# Patient Record
Sex: Male | Born: 1980 | Race: Black or African American | Hispanic: No | Marital: Married | Smoking: Never smoker
Health system: Southern US, Community
[De-identification: ages and names within clinical notes are randomized; demographics above are authoritative.]

## PROBLEM LIST (undated history)

## (undated) DIAGNOSIS — R718 Other abnormality of red blood cells: Secondary | ICD-10-CM

## (undated) DIAGNOSIS — I517 Cardiomegaly: Secondary | ICD-10-CM

## (undated) DIAGNOSIS — E1129 Type 2 diabetes mellitus with other diabetic kidney complication: Secondary | ICD-10-CM

## (undated) DIAGNOSIS — R9389 Abnormal findings on diagnostic imaging of other specified body structures: Secondary | ICD-10-CM

## (undated) DIAGNOSIS — D691 Qualitative platelet defects: Secondary | ICD-10-CM

## (undated) DIAGNOSIS — I10 Essential (primary) hypertension: Secondary | ICD-10-CM

## (undated) DIAGNOSIS — G4730 Sleep apnea, unspecified: Secondary | ICD-10-CM

## (undated) DIAGNOSIS — Z9289 Personal history of other medical treatment: Secondary | ICD-10-CM

## (undated) DIAGNOSIS — R7303 Prediabetes: Secondary | ICD-10-CM

## (undated) HISTORY — DX: Sleep apnea, unspecified: G47.30

## (undated) HISTORY — DX: Personal history of other medical treatment: Z92.89

---
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
## 1998-04-02 HISTORY — PX: HAND SURGERY: SHX662

## 2000-10-09 ENCOUNTER — Emergency Department (HOSPITAL_COMMUNITY): Admission: EM | Admit: 2000-10-09 | Discharge: 2000-10-09 | Payer: Self-pay | Admitting: Emergency Medicine

## 2001-06-12 ENCOUNTER — Emergency Department (HOSPITAL_COMMUNITY): Admission: EM | Admit: 2001-06-12 | Discharge: 2001-06-12 | Payer: Self-pay | Admitting: Emergency Medicine

## 2001-10-21 ENCOUNTER — Emergency Department (HOSPITAL_COMMUNITY): Admission: EM | Admit: 2001-10-21 | Discharge: 2001-10-22 | Payer: Self-pay | Admitting: Emergency Medicine

## 2002-11-27 ENCOUNTER — Emergency Department (HOSPITAL_COMMUNITY): Admission: EM | Admit: 2002-11-27 | Discharge: 2002-11-27 | Payer: Self-pay | Admitting: Emergency Medicine

## 2004-02-23 ENCOUNTER — Emergency Department (HOSPITAL_COMMUNITY): Admission: EM | Admit: 2004-02-23 | Discharge: 2004-02-23 | Payer: Self-pay | Admitting: Emergency Medicine

## 2005-03-09 ENCOUNTER — Emergency Department (HOSPITAL_COMMUNITY): Admission: EM | Admit: 2005-03-09 | Discharge: 2005-03-09 | Payer: Self-pay | Admitting: Emergency Medicine

## 2005-07-28 ENCOUNTER — Emergency Department (HOSPITAL_COMMUNITY): Admission: EM | Admit: 2005-07-28 | Discharge: 2005-07-28 | Payer: Self-pay | Admitting: Family Medicine

## 2005-10-11 ENCOUNTER — Emergency Department (HOSPITAL_COMMUNITY): Admission: EM | Admit: 2005-10-11 | Discharge: 2005-10-11 | Payer: Self-pay | Admitting: Family Medicine

## 2005-11-09 ENCOUNTER — Emergency Department (HOSPITAL_COMMUNITY): Admission: EM | Admit: 2005-11-09 | Discharge: 2005-11-09 | Payer: Self-pay | Admitting: Family Medicine

## 2005-11-16 ENCOUNTER — Emergency Department (HOSPITAL_COMMUNITY): Admission: EM | Admit: 2005-11-16 | Discharge: 2005-11-16 | Payer: Self-pay | Admitting: Family Medicine

## 2005-11-17 ENCOUNTER — Emergency Department (HOSPITAL_COMMUNITY): Admission: EM | Admit: 2005-11-17 | Discharge: 2005-11-17 | Payer: Self-pay | Admitting: Emergency Medicine

## 2005-11-18 ENCOUNTER — Emergency Department (HOSPITAL_COMMUNITY): Admission: EM | Admit: 2005-11-18 | Discharge: 2005-11-18 | Payer: Self-pay | Admitting: Family Medicine

## 2005-11-19 ENCOUNTER — Emergency Department (HOSPITAL_COMMUNITY): Admission: EM | Admit: 2005-11-19 | Discharge: 2005-11-19 | Payer: Self-pay | Admitting: Emergency Medicine

## 2005-11-25 ENCOUNTER — Emergency Department (HOSPITAL_COMMUNITY): Admission: EM | Admit: 2005-11-25 | Discharge: 2005-11-25 | Payer: Self-pay | Admitting: Family Medicine

## 2006-01-28 ENCOUNTER — Emergency Department (HOSPITAL_COMMUNITY): Admission: EM | Admit: 2006-01-28 | Discharge: 2006-01-28 | Payer: Self-pay | Admitting: Family Medicine

## 2006-03-18 ENCOUNTER — Emergency Department (HOSPITAL_COMMUNITY): Admission: EM | Admit: 2006-03-18 | Discharge: 2006-03-18 | Payer: Self-pay | Admitting: Family Medicine

## 2006-06-21 ENCOUNTER — Emergency Department (HOSPITAL_COMMUNITY): Admission: EM | Admit: 2006-06-21 | Discharge: 2006-06-21 | Payer: Self-pay | Admitting: Family Medicine

## 2006-08-08 ENCOUNTER — Emergency Department (HOSPITAL_COMMUNITY): Admission: EM | Admit: 2006-08-08 | Discharge: 2006-08-08 | Payer: Self-pay | Admitting: Family Medicine

## 2006-12-12 ENCOUNTER — Emergency Department (HOSPITAL_COMMUNITY): Admission: EM | Admit: 2006-12-12 | Discharge: 2006-12-12 | Payer: Self-pay | Admitting: Emergency Medicine

## 2007-01-02 ENCOUNTER — Emergency Department (HOSPITAL_COMMUNITY): Admission: EM | Admit: 2007-01-02 | Discharge: 2007-01-02 | Payer: Self-pay | Admitting: Emergency Medicine

## 2007-05-30 ENCOUNTER — Emergency Department (HOSPITAL_COMMUNITY): Admission: EM | Admit: 2007-05-30 | Discharge: 2007-05-30 | Payer: Self-pay | Admitting: Emergency Medicine

## 2007-12-08 ENCOUNTER — Emergency Department (HOSPITAL_COMMUNITY): Admission: EM | Admit: 2007-12-08 | Discharge: 2007-12-08 | Payer: Self-pay | Admitting: Family Medicine

## 2008-10-02 ENCOUNTER — Ambulatory Visit (HOSPITAL_COMMUNITY): Admission: EM | Admit: 2008-10-02 | Discharge: 2008-10-02 | Payer: Self-pay | Admitting: Emergency Medicine

## 2008-11-10 ENCOUNTER — Emergency Department (HOSPITAL_COMMUNITY): Admission: EM | Admit: 2008-11-10 | Discharge: 2008-11-10 | Payer: Self-pay | Admitting: Family Medicine

## 2009-01-16 ENCOUNTER — Observation Stay (HOSPITAL_COMMUNITY): Admission: EM | Admit: 2009-01-16 | Discharge: 2009-01-17 | Payer: Self-pay | Admitting: Emergency Medicine

## 2009-01-16 ENCOUNTER — Emergency Department (HOSPITAL_COMMUNITY): Admission: EM | Admit: 2009-01-16 | Discharge: 2009-01-17 | Payer: Self-pay | Admitting: Emergency Medicine

## 2010-05-07 ENCOUNTER — Inpatient Hospital Stay (INDEPENDENT_AMBULATORY_CARE_PROVIDER_SITE_OTHER)
Admission: RE | Admit: 2010-05-07 | Discharge: 2010-05-07 | Disposition: A | Discharge status: Home / Self Care | Payer: Self-pay | Source: Ambulatory Visit | Attending: Family Medicine | Admitting: Family Medicine

## 2010-05-07 DIAGNOSIS — K649 Unspecified hemorrhoids: Secondary | ICD-10-CM

## 2010-07-06 LAB — BASIC METABOLIC PANEL
BUN: 10 mg/dL (ref 6–23)
CO2: 27 mEq/L (ref 19–32)
Calcium: 8.1 mg/dL — ABNORMAL LOW (ref 8.4–10.5)
Chloride: 106 mEq/L (ref 96–112)
Creatinine, Ser: 1.01 mg/dL (ref 0.4–1.5)
GFR calc Af Amer: >60 mL/min (ref >60)
GFR calc non Af Amer: >60 mL/min (ref >60)
Glucose, Bld: 95 mg/dL (ref 70–99)
Potassium: 4.1 mEq/L (ref 3.5–5.1)
Sodium: 137 mEq/L (ref 135–145)

## 2010-07-06 LAB — POCT CARDIAC MARKERS
CKMB, poc: 2.1 ng/mL (ref 1.0–8.0)
CKMB, poc: <1 ng/mL — ABNORMAL LOW (ref 1.0–8.0)
Myoglobin, poc: 63.3 ng/mL (ref 12–200)
Myoglobin, poc: 74.6 ng/mL (ref 12–200)
Troponin i, poc: <0.05 ng/mL (ref 0.00–0.09)
Troponin i, poc: <0.05 ng/mL (ref 0.00–0.09)

## 2010-07-06 LAB — DIFFERENTIAL
Basophils Absolute: 0 10*3/uL (ref 0.0–0.1)
Basophils Relative: 0 % (ref 0–1)
Eosinophils Absolute: 0.1 10*3/uL (ref 0.0–0.7)
Eosinophils Relative: 2 % (ref 0–5)
Lymphocytes Relative: 48 % — ABNORMAL HIGH (ref 12–46)
Lymphs Abs: 2.2 10*3/uL (ref 0.7–4.0)
Monocytes Absolute: 0.5 10*3/uL (ref 0.1–1.0)
Monocytes Relative: 11 % (ref 3–12)
Neutro Abs: 1.8 10*3/uL (ref 1.7–7.7)
Neutrophils Relative %: 39 % — ABNORMAL LOW (ref 43–77)

## 2010-07-06 LAB — CBC
HCT: 36.5 % — ABNORMAL LOW (ref 39.0–52.0)
Hemoglobin: 11.8 g/dL — ABNORMAL LOW (ref 13.0–17.0)
MCHC: 32.4 g/dL (ref 30.0–36.0)
MCV: 67.5 fL — ABNORMAL LOW (ref 78.0–100.0)
Platelets: 231 10*3/uL (ref 150–400)
RBC: 5.4 MIL/uL (ref 4.22–5.81)
RDW: 16.6 % — ABNORMAL HIGH (ref 11.5–15.5)
WBC: 4.7 10*3/uL (ref 4.0–10.5)

## 2010-07-06 LAB — URINALYSIS, ROUTINE W REFLEX MICROSCOPIC
Bilirubin Urine: NEGATIVE
Glucose, UA: NEGATIVE mg/dL
Hgb urine dipstick: NEGATIVE
Ketones, ur: NEGATIVE mg/dL
Nitrite: NEGATIVE
Protein, ur: NEGATIVE mg/dL
Specific Gravity, Urine: 1.03 (ref 1.005–1.030)
Urobilinogen, UA: 1 mg/dL (ref 0.0–1.0)
pH: 6 (ref 5.0–8.0)

## 2010-07-06 LAB — CK TOTAL AND CKMB (NOT AT ARMC)
CK, MB: 2.7 ng/mL (ref 0.3–4.0)
Relative Index: 0.8 (ref 0.0–2.5)
Total CK: 353 U/L — ABNORMAL HIGH (ref 7–232)

## 2010-07-06 LAB — RAPID URINE DRUG SCREEN, HOSP PERFORMED
Amphetamines: NOT DETECTED
Barbiturates: NOT DETECTED
Benzodiazepines: NOT DETECTED
Cocaine: NOT DETECTED
Opiates: NOT DETECTED
Tetrahydrocannabinol: NOT DETECTED

## 2010-07-06 LAB — TROPONIN I: Troponin I: 0.01 ng/mL (ref 0.00–0.06)

## 2010-07-09 LAB — DIFFERENTIAL
Basophils Absolute: 0 10*3/uL (ref 0.0–0.1)
Basophils Relative: 1 % (ref 0–1)
Eosinophils Absolute: 0.1 10*3/uL (ref 0.0–0.7)
Eosinophils Relative: 2 % (ref 0–5)
Lymphocytes Relative: 37 % (ref 12–46)
Lymphs Abs: 1.4 10*3/uL (ref 0.7–4.0)
Monocytes Absolute: 0.7 10*3/uL (ref 0.1–1.0)
Monocytes Relative: 18 % — ABNORMAL HIGH (ref 3–12)
Neutro Abs: 1.6 10*3/uL — ABNORMAL LOW (ref 1.7–7.7)
Neutrophils Relative %: 43 % (ref 43–77)

## 2010-07-09 LAB — CBC
HCT: 40.7 % (ref 39.0–52.0)
Hemoglobin: 13.2 g/dL (ref 13.0–17.0)
MCHC: 32.5 g/dL (ref 30.0–36.0)
MCV: 67.3 fL — ABNORMAL LOW (ref 78.0–100.0)
Platelets: 230 10*3/uL (ref 150–400)
RBC: 6.05 MIL/uL — ABNORMAL HIGH (ref 4.22–5.81)
RDW: 16.3 % — ABNORMAL HIGH (ref 11.5–15.5)
WBC: 3.8 10*3/uL — ABNORMAL LOW (ref 4.0–10.5)

## 2010-07-09 LAB — BASIC METABOLIC PANEL
BUN: 8 mg/dL (ref 6–23)
CO2: 24 mEq/L (ref 19–32)
Calcium: 8.8 mg/dL (ref 8.4–10.5)
Chloride: 108 mEq/L (ref 96–112)
Creatinine, Ser: 0.93 mg/dL (ref 0.4–1.5)
GFR calc Af Amer: >60 mL/min (ref >60)
GFR calc non Af Amer: >60 mL/min (ref >60)
Glucose, Bld: 129 mg/dL — ABNORMAL HIGH (ref 70–99)
Potassium: 3.9 mEq/L (ref 3.5–5.1)
Sodium: 139 mEq/L (ref 135–145)

## 2010-08-15 NOTE — Op Note (Signed)
NAME:  Darren Little, Darren Little NO.:  [id number]   MEDICAL RECORD NO.:  [id number]          PATIENT TYPE:  INP   LOCATION:  1824                         FACILITY:  [facility]   PHYSICIAN:  Johnette Abraham, MD    DATE OF BIRTH:  Jan 31, 1981   DATE OF PROCEDURE:  10/02/2008  DATE OF DISCHARGE:  10/02/2008                               OPERATIVE REPORT   SURGEON:  Harrill C. Izora Ribas, MD   PREOPERATIVE DIAGNOSIS:  Complex laceration to the right hand.   POSTOPERATIVE DIAGNOSIS:  Complex laceration to the right hand.   PROCEDURES:  1. Exploration of a complex wound of the right hand.  2. Exploration of the flexor tendon sheath of both the right long and      right ring fingers.  3. Exploration of the neurovascular bundles of the right ring and      right long finger.  4. Debridement of skin, subcutaneous tissue, fascia, and muscle.  5. Release of the A1 pulley of the right ring finger and right long      finger.  6. Open reduction of the proximal phalanx of the right long finger,  7. layered wound closure totaling 6 cm.   INDICATIONS:  Darren Little is a 30 year old male who had a hydraulic  lift fall on his hand this afternoon.  He presented to the emergency  department and I was consulted.  Risks, benefits, and alternatives of  surgery were discussed with the patient and the patient's significant  other, and they  agreed to proceed.  Consent was obtained.   PROCEDURE:  The patient was taken to the operating room, placed supine  on the operating room table.  Preoperative antibiotics were given.  Lower extremities were padded.  General anesthesia was administered  without difficulty.  The right upper extremity was prepped and draped in  normal sterile fashion.  A tourniquet was used.  The arm was  exsanguinated with an Esmarch, and the tourniquet was inflated to 250  mmHg.  The wound was evaluated, it was a C-shaped laceration in the palm  overlying the distal palmar crease back  to approximately the distal end  of the transverse carpal ligament overlying the third and fourth  metacarpals.  The wound was explored.  Nonviable skin, subcutaneous  tissue, fascia, and muscle were debrided.  The wound was all the way  down to the flexor tendon sheath.  Both the flexor tendon sheath to the  ring and long finger were opened and explored.  Both tendons were in  continuity.  Following, the neurovascular bundle was isolated and  traced.  The main branch was traced in its division to the ulnar long  and radial ring fingers were clearly identified and without significant  laceration.  The neurovascular bundle to the radial long finger was also  visualized and was without significant laceration.  Because of the  degree of swelling and muscle contusion, the A1 pulley to the ring and  long finger were both incised.  Afterwards the tourniquet was released.  Hemostasis was obtained.  Wound was irrigated thoroughly with irrigation  solution, and  then the wound was closed in layers.  The initial layer  was the subcutaneous and deep dermal layers with interrupted 5-0 Vicryls  followed by skin with several interrupted 5-0 nylon sutures.  All  fingers were nice and pink at the  completion of the case.  Simple in-line traction was used for reduction  of the proximal phalanx of the right long finger.  Afterwards, a sterile  dressing and splint were placed.  The patient tolerated the procedure  well, was taken to the recovery room in stable condition.      Johnette Abraham, MD  Electronically Signed     HCC/MEDQ  D:  10/02/2008  T:  10/03/2008  Job:  [id number]

## 2011-01-02 ENCOUNTER — Inpatient Hospital Stay (INDEPENDENT_AMBULATORY_CARE_PROVIDER_SITE_OTHER)
Admission: RE | Admit: 2011-01-02 | Discharge: 2011-01-02 | Disposition: A | Discharge status: Home / Self Care | Payer: Self-pay | Source: Ambulatory Visit | Attending: Emergency Medicine | Admitting: Emergency Medicine

## 2011-01-02 DIAGNOSIS — L259 Unspecified contact dermatitis, unspecified cause: Secondary | ICD-10-CM

## 2011-04-08 ENCOUNTER — Emergency Department (HOSPITAL_COMMUNITY)
Admission: EM | Admit: 2011-04-08 | Discharge: 2011-04-08 | Disposition: A | Discharge status: Home / Self Care | Payer: Self-pay | Attending: Emergency Medicine | Admitting: Emergency Medicine

## 2011-04-08 ENCOUNTER — Encounter: Payer: Self-pay | Admitting: *Deleted

## 2011-04-08 DIAGNOSIS — Z79899 Other long term (current) drug therapy: Secondary | ICD-10-CM | POA: Insufficient documentation

## 2011-04-08 DIAGNOSIS — K089 Disorder of teeth and supporting structures, unspecified: Secondary | ICD-10-CM | POA: Insufficient documentation

## 2011-04-08 DIAGNOSIS — I10 Essential (primary) hypertension: Secondary | ICD-10-CM | POA: Insufficient documentation

## 2011-04-08 DIAGNOSIS — K0889 Other specified disorders of teeth and supporting structures: Secondary | ICD-10-CM

## 2011-04-08 DIAGNOSIS — K029 Dental caries, unspecified: Secondary | ICD-10-CM | POA: Insufficient documentation

## 2011-04-08 HISTORY — DX: Essential (primary) hypertension: I10

## 2011-04-08 MED ORDER — CLINDAMYCIN HCL 150 MG PO CAPS: 300.0000 mg | ORAL_CAPSULE | Freq: Three times a day (TID) | ORAL | Status: AC

## 2011-04-08 MED ORDER — HYDROMORPHONE HCL PF 1 MG/ML IJ SOLN
1.0000 mg | Freq: Once | INTRAMUSCULAR | Status: AC
  Administered 2011-04-08: 1 mg via INTRAMUSCULAR
  Filled 2011-04-08: qty 1

## 2011-04-08 MED ORDER — KETOROLAC TROMETHAMINE 30 MG/ML IJ SOLN
30.0000 mg | Freq: Once | INTRAMUSCULAR | Status: AC
  Administered 2011-04-08: 30 mg via INTRAMUSCULAR
  Filled 2011-04-08: qty 1

## 2011-04-08 MED ORDER — OXYCODONE-ACETAMINOPHEN 5-325 MG PO TABS: 1.0000 | ORAL_TABLET | Freq: Four times a day (QID) | ORAL | Status: DC | PRN

## 2011-04-08 NOTE — ED Provider Notes (Signed)
History     CSN: [id number]  Arrival date & time 04/08/11  0003   First MD Initiated Contact with Patient 04/08/11 0038      Chief Complaint  Patient presents with  . Dental Pain    (Consider location/radiation/quality/duration/timing/severity/associated sxs/prior treatment) Patient is a 31 y.o. male presenting with tooth pain. The history is provided by the patient.  Dental PainPrimary symptoms do not include sore throat or cough.  Additional symptoms do not include: trouble swallowing, drooling and ear pain.   patient's had dental pain for last couple months. Increased pain tonight. He has bad teeth and no insurance to get it fixed. No swelling. No trouble breathing. No chest pain. His blood pressure is high. He is on blood pressure medicine. No trouble swallowing.  Past Medical History  Diagnosis Date  . Hypertension     History reviewed. No pertinent past surgical history.  History reviewed. No pertinent family history.  History  Substance Use Topics  . Smoking status: Never Smoker   . Smokeless tobacco: Not on file  . Alcohol Use: No      Review of Systems  HENT: Positive for dental problem. Negative for ear pain, sore throat, drooling, mouth sores and trouble swallowing.   Respiratory: Negative for cough and chest tightness.   Cardiovascular: Negative for chest pain.  Genitourinary: Negative for flank pain.  Skin: Negative for rash.  Neurological: Negative for seizures and numbness.    Allergies  Review of patient's allergies indicates no known allergies.  Home Medications   Current Outpatient Rx  Name Route Sig Dispense Refill  . IBUPROFEN 100 MG/5ML PO SUSP Oral Take 100 mg by mouth once.      Marland Kitchen LISINOPRIL-HYDROCHLOROTHIAZIDE 20-25 MG PO TABS Oral Take 1 tablet by mouth daily.      Marland Kitchen PENICILLIN V POTASSIUM 250 MG PO TABS Oral Take 250 mg by mouth once.      Marland Kitchen CLINDAMYCIN HCL 150 MG PO CAPS Oral Take 2 capsules (300 mg total) by mouth 3 (three) times  daily. 28 capsule 0  . OXYCODONE-ACETAMINOPHEN 5-325 MG PO TABS Oral Take 1-2 tablets by mouth every 6 (six) hours as needed for pain. 20 tablet 0    BP 179/117  Pulse 80  Temp(Src) 98.1 F (36.7 C) (Oral)  Resp 20  SpO2 96%  Physical Exam  Nursing note and vitals reviewed. Constitutional: He is oriented to person, place, and time. He appears well-developed and well-nourished.  HENT:  Head: Normocephalic and atraumatic.       Left upper 6 and seventh tooth from midline tender with cavities. Left lower fifth 2 previously removed. Sixth and seventh tooth tender. No swelling of jaw. No fluctuance.  Eyes: EOM are normal. Pupils are equal, round, and reactive to light.  Neck: Normal range of motion. Neck supple.  Cardiovascular: Normal rate, regular rhythm and normal heart sounds.   No murmur heard.      Hypertension  Pulmonary/Chest: Effort normal and breath sounds normal.  Abdominal: Soft. Bowel sounds are normal. He exhibits no distension and no mass. There is no tenderness. There is no rebound and no guarding.  Musculoskeletal: Normal range of motion. He exhibits no edema.  Neurological: He is alert and oriented to person, place, and time. No cranial nerve deficit.  Skin: Skin is warm and dry.  Psychiatric: He has a normal mood and affect.    ED Course  Procedures (including critical care time)  Labs Reviewed - No data to display No  results found.   1. Pain, dental   2. Hypertension       MDM  Dental pain with bad teeth. Pain improved with treatment. Patient has a dentist to follow with. He's been on penicillin, will change to Cleocin. Hypertension is noted the patient. He is on medication. This improved somewhat with pain medication. He'll follow with his Dr.        Harrold Donath R. Rubin Payor, MD 04/08/11 [phone]

## 2011-04-08 NOTE — ED Notes (Signed)
Notified Dr. Rubin Payor of this pt blood pressure. He states that he is aware of this bp.

## 2011-04-08 NOTE — ED Notes (Signed)
Pt reports having upper and lower (L) sided tooth pain.  Reports intermittent pain x 2-3 months with increased pain tonight.  Pt had teeth pulled by dentist, pain in that socket, other tooth is chipped-no insurance so is unable to get it fixed.  No swelling, respiratory difficulty.

## 2011-04-09 ENCOUNTER — Emergency Department (HOSPITAL_COMMUNITY)
Admission: EM | Admit: 2011-04-09 | Discharge: 2011-04-10 | Disposition: A | Discharge status: Home / Self Care | Payer: Self-pay | Attending: Emergency Medicine | Admitting: Emergency Medicine

## 2011-04-09 ENCOUNTER — Encounter (HOSPITAL_COMMUNITY): Payer: Self-pay | Admitting: *Deleted

## 2011-04-09 DIAGNOSIS — Z79899 Other long term (current) drug therapy: Secondary | ICD-10-CM | POA: Insufficient documentation

## 2011-04-09 DIAGNOSIS — K0889 Other specified disorders of teeth and supporting structures: Secondary | ICD-10-CM

## 2011-04-09 DIAGNOSIS — I10 Essential (primary) hypertension: Secondary | ICD-10-CM | POA: Insufficient documentation

## 2011-04-09 DIAGNOSIS — K089 Disorder of teeth and supporting structures, unspecified: Secondary | ICD-10-CM | POA: Insufficient documentation

## 2011-04-09 DIAGNOSIS — K029 Dental caries, unspecified: Secondary | ICD-10-CM | POA: Insufficient documentation

## 2011-04-09 NOTE — ED Notes (Signed)
Pt states that his bottom left tooth has been hurting him all day. Tooth is intact, pt has a rx for clindamycin from a visit on Friday that he just got filled today. Pt unable to close mouth due to pain. Pt alert and oriented, denies facial swelling, or facial pain.

## 2011-04-09 NOTE — ED Notes (Signed)
C/o bottom L back tooth pain, ongoing all day, "worse after taking clindamycin". Seen here Friday night. Radiates into L ear. Pt moaning, writhing and restless.

## 2011-04-10 ENCOUNTER — Emergency Department (HOSPITAL_COMMUNITY)
Admission: EM | Admit: 2011-04-10 | Discharge: 2011-04-10 | Discharge status: Against Medical Advice | Payer: Self-pay | Attending: Emergency Medicine | Admitting: Emergency Medicine

## 2011-04-10 ENCOUNTER — Encounter (HOSPITAL_COMMUNITY): Payer: Self-pay | Admitting: Emergency Medicine

## 2011-04-10 DIAGNOSIS — H9209 Otalgia, unspecified ear: Secondary | ICD-10-CM | POA: Insufficient documentation

## 2011-04-10 DIAGNOSIS — K089 Disorder of teeth and supporting structures, unspecified: Secondary | ICD-10-CM | POA: Insufficient documentation

## 2011-04-10 NOTE — ED Notes (Signed)
Pt here for tooth pain on left side of face and can not hear out of left ear; pt sts pain started on last thursday

## 2011-04-10 NOTE — ED Notes (Signed)
No answer to call at triage and waiting areas pt possible eloped

## 2011-04-10 NOTE — ED Provider Notes (Signed)
History     CSN: [id number]  Arrival date & time 04/09/11  2320   First MD Initiated Contact with Patient 04/10/11 0015      Chief Complaint  Patient presents with  . Dental Pain     HPI  History provided by the patient. Patient is a 31 year old male who presents with complaints of left lower molar pain has become worse earlier today around 10 AM. She reports having intermittent problems with the same tooth for the past several days. He reports being seen in the emergency room 3 days ago on Friday. He was given prescriptions for pain medicine and antibiotic which she reports just starting today. Patient also reports having some bleeding from the area. Pain is sometimes improved with very cold water but then pain returns for a quickly. She denies any swelling of the tongue. He denies any fever, chills, sweats patient has not tried followup with a dentist.    Past Medical History  Diagnosis Date  . Hypertension     History reviewed. No pertinent past surgical history.  History reviewed. No pertinent family history.  History  Substance Use Topics  . Smoking status: Never Smoker   . Smokeless tobacco: Not on file  . Alcohol Use: No      Review of Systems  Constitutional: Negative for fever and chills.  All other systems reviewed and are negative.    Allergies  Review of patient's allergies indicates no known allergies.  Home Medications   Current Outpatient Rx  Name Route Sig Dispense Refill  . CLINDAMYCIN HCL 150 MG PO CAPS Oral Take 2 capsules (300 mg total) by mouth 3 (three) times daily. 28 capsule 0  . IBUPROFEN 100 MG/5ML PO SUSP Oral Take 100 mg by mouth once.      Marland Kitchen LISINOPRIL-HYDROCHLOROTHIAZIDE 20-25 MG PO TABS Oral Take 1 tablet by mouth daily.      . OXYCODONE-ACETAMINOPHEN 5-325 MG PO TABS Oral Take 1-2 tablets by mouth every 6 (six) hours as needed for pain. 20 tablet 0  . PENICILLIN V POTASSIUM 250 MG PO TABS Oral Take 250 mg by mouth once.         BP 209/129  Pulse 77  Temp(Src) 97.7 F (36.5 C) (Oral)  Resp 21  SpO2 95%  Physical Exam  Nursing note and vitals reviewed. Constitutional: He is oriented to person, place, and time. He appears well-developed and well-nourished.  HENT:  Head: Normocephalic.  Mouth/Throat: Oropharynx is clear and moist.       Patient with missing left first molar. There is a carry to the base of the anterior left lower second molar. There is significant pain with percussion over left lower second molar. There is a small amount of bleeding from the gingiva. There is no swelling or drainable abscess identified. Patient also has very mild pain to the left upper second molar area. No significant carry identified. No signs of Ludwig's angina  Neck: Normal range of motion. Neck supple.  Cardiovascular: Normal rate and regular rhythm.   Pulmonary/Chest: Effort normal and breath sounds normal.  Abdominal: Soft.  Lymphadenopathy:    He has no cervical adenopathy.  Neurological: He is alert and oriented to person, place, and time.  Skin: Skin is warm and dry.  Psychiatric: He has a normal mood and affect. His behavior is normal.    ED Course  Procedures   Dental Block Performed by: Angus Seller Authorized by: Angus Seller Consent: Verbal consent obtained. Risks and benefits: risks, benefits  and alternatives were discussed Consent given by: patient Patient identity confirmed: provided demographic data  Location: Left lower second molar  Local anesthetic: Bupivacaine 0.5% with epinephrine  Anesthetic total: 3.6 ml  Irrigation method: syringe  Patient tolerance: Patient tolerated the procedure well with no immediate complications. Pain improved.     1. Pain, dental   2. Dental caries      MDM  12:30 AM patient seen and evaluated. Patient in no acute distress.        Angus Seller, [place] 04/10/11 [phone]

## 2011-04-10 NOTE — ED Notes (Signed)
Call to triage and waiting areas no answer at this time x2 calls pt assumed to have eloped

## 2011-04-11 ENCOUNTER — Encounter (HOSPITAL_COMMUNITY): Payer: Self-pay | Admitting: Emergency Medicine

## 2011-04-11 ENCOUNTER — Emergency Department (HOSPITAL_COMMUNITY)
Admission: EM | Admit: 2011-04-11 | Discharge: 2011-04-11 | Disposition: A | Discharge status: Home / Self Care | Payer: Self-pay | Attending: Emergency Medicine | Admitting: Emergency Medicine

## 2011-04-11 DIAGNOSIS — K029 Dental caries, unspecified: Secondary | ICD-10-CM | POA: Insufficient documentation

## 2011-04-11 DIAGNOSIS — Z79899 Other long term (current) drug therapy: Secondary | ICD-10-CM | POA: Insufficient documentation

## 2011-04-11 DIAGNOSIS — K089 Disorder of teeth and supporting structures, unspecified: Secondary | ICD-10-CM | POA: Insufficient documentation

## 2011-04-11 DIAGNOSIS — I10 Essential (primary) hypertension: Secondary | ICD-10-CM | POA: Insufficient documentation

## 2011-04-11 MED ORDER — OXYCODONE-ACETAMINOPHEN 5-325 MG PO TABS
2.0000 | ORAL_TABLET | Freq: Once | ORAL | Status: AC
  Administered 2011-04-11: 2 via ORAL
  Filled 2011-04-11: qty 2

## 2011-04-11 NOTE — ED Provider Notes (Signed)
Medical screening examination/treatment/procedure(s) were performed by non-physician practitioner and as supervising physician I was immediately available for consultation/collaboration.   Vida Roller, MD 04/11/11 2222

## 2011-04-11 NOTE — ED Notes (Signed)
Spoke to [place] office and they will call pt and set up time to have tooth pulled. Dr office given pt best phone number to reach him. Pt states he understands that office will call him

## 2011-04-11 NOTE — ED Provider Notes (Signed)
History     CSN: [id number]  Arrival date & time 04/11/11  1035   First MD Initiated Contact with Patient 04/11/11 1152      Chief Complaint  Patient presents with  . Dental Pain    (Consider location/radiation/quality/duration/timing/severity/associated sxs/prior treatment) HPI Patient presents emergency room with complaints of dental pain. Patient states the pain has been ongoing since last week. He has been seen daily in emergency department for this tooth pain over the last 4 days. Patient has been referred to a dentist as well as an Transport planner. Patient was given pain medications as well as antibiotics but none of these things are working. Patient states the pain is upper left posterior molar region. He saw a dentist he tried to hold the tooth but was unable to do so. He was then referred to an oral surgeon he cannot afford the visit so he was told to come back to the emergency room because [facility] has a referral system. Past Medical History  Diagnosis Date  . Hypertension     History reviewed. No pertinent past surgical history.  No family history on file.  History  Substance Use Topics  . Smoking status: Never Smoker   . Smokeless tobacco: Not on file  . Alcohol Use: No      Review of Systems  All other systems reviewed and are negative.    Allergies  Review of patient's allergies indicates no known allergies.  Home Medications   Current Outpatient Rx  Name Route Sig Dispense Refill  . CLINDAMYCIN HCL 150 MG PO CAPS Oral Take 2 capsules (300 mg total) by mouth 3 (three) times daily. 28 capsule 0  . LISINOPRIL-HYDROCHLOROTHIAZIDE 20-25 MG PO TABS Oral Take 1 tablet by mouth daily.      . OXYCODONE-ACETAMINOPHEN 5-325 MG PO TABS Oral Take 1-2 tablets by mouth every 6 (six) hours as needed. For pain.     Marland Kitchen PENICILLIN V POTASSIUM 250 MG PO TABS Oral Take 250 mg by mouth once.       BP 177/122  Pulse 87  Temp(Src) 97 F (36.1 C) (Oral)  Resp 16  SpO2  99%  Physical Exam  Nursing note and vitals reviewed. Constitutional: He appears well-developed and well-nourished. No distress.  HENT:  Head: Normocephalic and atraumatic. No trismus in the jaw.  Right Ear: External ear normal.  Left Ear: External ear normal.  Mouth/Throat: No oral lesions. Dental caries present. No dental abscesses, uvula swelling or lacerations. No oropharyngeal exudate.         Dental caries noted,  Eyes: Conjunctivae are normal. Right eye exhibits no discharge. Left eye exhibits no discharge. No scleral icterus.  Neck: Neck supple. No tracheal deviation present.  Cardiovascular: Normal rate.   Pulmonary/Chest: Effort normal. No stridor. No respiratory distress.  Musculoskeletal: He exhibits no edema.  Neurological: He is alert. Cranial nerve deficit: no gross deficits.  Skin: Skin is warm and dry. No rash noted.  Psychiatric: He has a normal mood and affect.    ED Course  Procedures (including critical care time)  Labs Reviewed - No data to display No results found.   Diagnosis: Dental caries   MDM   Patient with persistent dental pain due to dental caries. There is no obvious swelling noted. Patient is already on antibiotics and pain medications. I will contact the oral surgeon on call, Dr. Estella Husk  so we can assist this gentleman was getting oral surgery followup for his dental extraction.  Celene Kras, MD 04/11/11 [phone]

## 2011-04-11 NOTE — ED Notes (Signed)
States has been here numerous times for same tooth was given pain meds  States saw stacy green [facility] but it was still tender and  It did not get pulled states is still taking antiobiotics and pain pills

## 2012-08-31 DIAGNOSIS — R9389 Abnormal findings on diagnostic imaging of other specified body structures: Secondary | ICD-10-CM

## 2012-08-31 HISTORY — DX: Abnormal findings on diagnostic imaging of other specified body structures: R93.89

## 2012-09-25 ENCOUNTER — Emergency Department (HOSPITAL_COMMUNITY): Payer: Medicaid Other

## 2012-09-25 ENCOUNTER — Encounter (HOSPITAL_COMMUNITY): Payer: Self-pay | Admitting: Adult Health

## 2012-09-25 ENCOUNTER — Inpatient Hospital Stay (HOSPITAL_COMMUNITY)
Admission: EM | Admit: 2012-09-25 | Discharge: 2012-09-28 | DRG: 305 | Disposition: A | Discharge status: Home / Self Care | Payer: Medicaid Other | Attending: Cardiology | Admitting: Cardiology

## 2012-09-25 ENCOUNTER — Other Ambulatory Visit: Payer: Self-pay

## 2012-09-25 DIAGNOSIS — R718 Other abnormality of red blood cells: Secondary | ICD-10-CM

## 2012-09-25 DIAGNOSIS — R9389 Abnormal findings on diagnostic imaging of other specified body structures: Secondary | ICD-10-CM

## 2012-09-25 DIAGNOSIS — R7303 Prediabetes: Secondary | ICD-10-CM

## 2012-09-25 DIAGNOSIS — R7309 Other abnormal glucose: Secondary | ICD-10-CM | POA: Diagnosis present

## 2012-09-25 DIAGNOSIS — E66812 Obesity, class 2: Secondary | ICD-10-CM

## 2012-09-25 DIAGNOSIS — M79662 Pain in left lower leg: Secondary | ICD-10-CM

## 2012-09-25 DIAGNOSIS — R079 Chest pain, unspecified: Secondary | ICD-10-CM

## 2012-09-25 DIAGNOSIS — I10 Essential (primary) hypertension: Secondary | ICD-10-CM

## 2012-09-25 DIAGNOSIS — E669 Obesity, unspecified: Secondary | ICD-10-CM | POA: Diagnosis present

## 2012-09-25 DIAGNOSIS — D691 Qualitative platelet defects: Secondary | ICD-10-CM

## 2012-09-25 DIAGNOSIS — I517 Cardiomegaly: Secondary | ICD-10-CM

## 2012-09-25 DIAGNOSIS — M79609 Pain in unspecified limb: Secondary | ICD-10-CM | POA: Diagnosis present

## 2012-09-25 DIAGNOSIS — D649 Anemia, unspecified: Secondary | ICD-10-CM | POA: Diagnosis present

## 2012-09-25 DIAGNOSIS — I161 Hypertensive emergency: Secondary | ICD-10-CM

## 2012-09-25 DIAGNOSIS — D509 Iron deficiency anemia, unspecified: Secondary | ICD-10-CM

## 2012-09-25 DIAGNOSIS — H538 Other visual disturbances: Secondary | ICD-10-CM | POA: Diagnosis present

## 2012-09-25 HISTORY — DX: Qualitative platelet defects: D69.1

## 2012-09-25 HISTORY — DX: Prediabetes: R73.03

## 2012-09-25 HISTORY — DX: Other abnormality of red blood cells: R71.8

## 2012-09-25 HISTORY — DX: Cardiomegaly: I51.7

## 2012-09-25 HISTORY — DX: Abnormal findings on diagnostic imaging of other specified body structures: R93.89

## 2012-09-25 LAB — CBC WITH DIFFERENTIAL/PLATELET
Basophils Absolute: 0 10*3/uL (ref 0.0–0.1)
Eosinophils Absolute: 0.2 10*3/uL (ref 0.0–0.7)
Lymphs Abs: 2.3 10*3/uL (ref 0.7–4.0)
MCH: 22 pg — ABNORMAL LOW (ref 26.0–34.0)
MCHC: 33.9 g/dL (ref 30.0–36.0)
MCV: 64.9 fL — ABNORMAL LOW (ref 78.0–100.0)
Monocytes Absolute: 0.9 10*3/uL (ref 0.1–1.0)
Neutro Abs: 2.4 10*3/uL (ref 1.7–7.7)
Platelets: 219 10*3/uL (ref 150–400)
RDW: 16 % — ABNORMAL HIGH (ref 11.5–15.5)
WBC: 5.8 10*3/uL (ref 4.0–10.5)

## 2012-09-25 LAB — COMPREHENSIVE METABOLIC PANEL
AST: 27 U/L (ref 0–37)
Albumin: 3.9 g/dL (ref 3.5–5.2)
BUN: 15 mg/dL (ref 6–23)
Calcium: 8.6 mg/dL (ref 8.4–10.5)
Creatinine, Ser: 1.03 mg/dL (ref 0.50–1.35)

## 2012-09-25 LAB — POCT I-STAT TROPONIN I: Troponin i, poc: 0.04 ng/mL (ref 0.00–0.08)

## 2012-09-25 MED ORDER — ASPIRIN 81 MG PO CHEW
324.0000 mg | CHEWABLE_TABLET | Freq: Once | ORAL | Status: AC
  Administered 2012-09-25: 324 mg via ORAL
  Filled 2012-09-25: qty 4

## 2012-09-25 MED ORDER — MORPHINE SULFATE 2 MG/ML IJ SOLN
2.0000 mg | Freq: Once | INTRAMUSCULAR | Status: AC
  Administered 2012-09-25: 2 mg via INTRAVENOUS
  Filled 2012-09-25: qty 1

## 2012-09-25 MED ORDER — IOHEXOL 350 MG/ML SOLN
100.0000 mL | Freq: Once | INTRAVENOUS | Status: AC | PRN
  Administered 2012-09-25: 100 mL via INTRAVENOUS

## 2012-09-25 MED ORDER — MORPHINE SULFATE 4 MG/ML IJ SOLN
4.0000 mg | Freq: Once | INTRAMUSCULAR | Status: AC
Start: 2012-09-25 — End: 2012-09-25
  Administered 2012-09-25: 4 mg via INTRAVENOUS
  Filled 2012-09-25: qty 1

## 2012-09-25 MED ORDER — NITROGLYCERIN IN D5W 200-5 MCG/ML-% IV SOLN
10.0000 ug/min | INTRAVENOUS | Status: DC
  Administered 2012-09-25: 20 ug/min via INTRAVENOUS
  Administered 2012-09-25: 10 ug/min via INTRAVENOUS
  Administered 2012-09-25: 15 ug/min via INTRAVENOUS
  Filled 2012-09-25: qty 250

## 2012-09-25 NOTE — ED Provider Notes (Signed)
History    CSN: [id number] Arrival date & time 09/25/12  4098  First MD Initiated Contact with Patient 09/25/12 2011     Chief Complaint  Patient presents with  . Chest Pain   (Consider location/radiation/quality/duration/timing/severity/associated sxs/prior Treatment) HPI Pt presents to triage with on and off L chest pain for 1 week. States he was working outside this morning between 8-9 am and began having L chest pain, nausea and Left arm numbness. +diaphoresis. Pt states pain has been constant throughout the day. States now 7/10. No history of CAD. Has history of HTN but is not on medication. Pt states mother had MI in 45's. Pt does not smoke. No lower ext swelling.  Past Medical History  Diagnosis Date  . Hypertension   . Prediabetes     A1C 6.1% 08/2012  . Microcytosis     MCV 65  . Abnormal CT scan 08/2012    R axilla adenopathy  . LVH (left ventricular hypertrophy)     08/2712 echo-EF 55-60%, moderate LVH and mild LA dilatation.   . Abnormal platelets 09/26/12    Large platelets  . Abnormal RBC 09/26/12    Elliptocytes   Past Surgical History  Procedure Laterality Date  . Hand surgery      Right hand, 5years agi   History reviewed. No pertinent family history. History  Substance Use Topics  . Smoking status: Never Smoker   . Smokeless tobacco: Not on file  . Alcohol Use: No    Review of Systems  Constitutional: Positive for diaphoresis. Negative for fever and chills.  HENT: Negative for neck pain.   Respiratory: Negative for cough and shortness of breath.   Cardiovascular: Positive for chest pain. Negative for palpitations and leg swelling.  Gastrointestinal: Positive for nausea. Negative for vomiting and abdominal pain.  Musculoskeletal: Negative for myalgias and back pain.  Skin: Negative for rash and wound.  Neurological: Positive for numbness. Negative for dizziness, weakness, light-headedness and headaches.  All other systems reviewed and are  negative.    Allergies  Bee venom and Other  Home Medications   Current Outpatient Rx  Name  Route  Sig  Dispense  Refill  . amLODipine (NORVASC) 5 MG tablet   Oral   Take 1 tablet (5 mg total) by mouth daily.   30 tablet   3   . hydrochlorothiazide (HYDRODIURIL) 25 MG tablet   Oral   Take 1 tablet (25 mg total) by mouth daily.   30 tablet   3   . lisinopril (PRINIVIL,ZESTRIL) 20 MG tablet   Oral   Take 1 tablet (20 mg total) by mouth daily.   30 tablet   3    BP 134/88  Pulse 68  Temp(Src) 98.2 F (36.8 C) (Oral)  Resp 18  Ht 6' (1.829 m)  Wt 262 lb 1.6 oz (118.888 kg)  BMI 35.54 kg/m2  SpO2 99% Physical Exam  Nursing note and vitals reviewed. Constitutional: He is oriented to person, place, and time. He appears well-developed and well-nourished. No distress.  HENT:  Head: Normocephalic and atraumatic.  Mouth/Throat: Oropharynx is clear and moist. No oropharyngeal exudate.  Eyes: EOM are normal. Pupils are equal, round, and reactive to light.  Neck: Normal range of motion. Neck supple.  Cardiovascular: Normal rate and regular rhythm.  Exam reveals no gallop and no friction rub.   No murmur heard. Pulmonary/Chest: Effort normal and breath sounds normal. No respiratory distress. He has no wheezes. He has no rales. He  exhibits tenderness (mild Left sided chest tenderness).  Abdominal: Soft. Bowel sounds are normal. He exhibits no distension and no mass. There is no tenderness. There is no rebound and no guarding.  Musculoskeletal: Normal range of motion. He exhibits no edema and no tenderness.  No calf swelling or pain  Neurological: He is alert and oriented to person, place, and time.  5/5 motor in all ext, sensation intact  Skin: Skin is warm and dry. No rash noted. No erythema.  Psychiatric: He has a normal mood and affect. His behavior is normal.    ED Course  Procedures (including critical care time) Labs Reviewed  CBC WITH DIFFERENTIAL - Abnormal;  Notable for the following:    RBC 6.13 (*)    MCV 64.9 (*)    MCH 22.0 (*)    RDW 16.0 (*)    Neutrophils Relative % 42 (*)    Monocytes Relative 15 (*)    All other components within normal limits  COMPREHENSIVE METABOLIC PANEL - Abnormal; Notable for the following:    Glucose, Bld 133 (*)    Total Bilirubin 0.2 (*)    All other components within normal limits  COMPREHENSIVE METABOLIC PANEL - Abnormal; Notable for the following:    Glucose, Bld 125 (*)    Albumin 3.4 (*)    All other components within normal limits  CBC WITH DIFFERENTIAL - Abnormal; Notable for the following:    Hemoglobin 11.8 (*)    HCT 36.1 (*)    MCV 65.5 (*)    MCH 21.4 (*)    RDW 16.0 (*)    Monocytes Relative 14 (*)    All other components within normal limits  HEMOGLOBIN A1C - Abnormal; Notable for the following:    Hemoglobin A1C 6.1 (*)    Mean Plasma Glucose 128 (*)    All other components within normal limits  BASIC METABOLIC PANEL - Abnormal; Notable for the following:    Glucose, Bld 111 (*)    All other components within normal limits  CBC WITH DIFFERENTIAL - Abnormal; Notable for the following:    RBC 6.32 (*)    MCV 65.2 (*)    MCH 21.7 (*)    RDW 16.2 (*)    Monocytes Relative 14 (*)    All other components within normal limits  BASIC METABOLIC PANEL - Abnormal; Notable for the following:    Glucose, Bld 105 (*)    GFR calc non Af Amer 75 (*)    GFR calc Af Amer 87 (*)    All other components within normal limits  CBC WITH DIFFERENTIAL - Abnormal; Notable for the following:    RBC 6.30 (*)    MCV 65.4 (*)    MCH 21.6 (*)    RDW 16.1 (*)    Monocytes Relative 14 (*)    All other components within normal limits  MRSA PCR SCREENING  MAGNESIUM  PHOSPHORUS  TSH  TROPONIN I  TROPONIN I  TROPONIN I  MAGNESIUM  PHOSPHORUS  MAGNESIUM  PHOSPHORUS  POCT I-STAT TROPONIN I   No results found. 1. Hypertensive emergency   2. Chest pain   3. Abnormal CT of the chest   4.  Microcytosis     MDM  Discussed with Dr Allyson Sabal @2010 . Reviewed EKG and stated he does not believe it is a STEMI. Cancelled code STEMI. Advised to have cardiology fellow evaluate in ED.  Cardiology fellow to see in ED  Pt BP improved with IV NTG and  morphine. Pain now 3/10.   Loren Racer, MD 09/29/12 [phone]

## 2012-09-25 NOTE — H&P (Signed)
History and Physical   Patient ID: Darren Little MRN: [id number], DOB/AGE: 06/17/1980   Admit date: 09/25/2012 Date of Consult: 09/25/2012   Primary Physician: No primary provider on file. Primary Cardiologist: Anderson Malta, assigned to [place] (on-call for [facility])  HPI: Darren Little is a 32 y.o. male AA with PMHx of essential HTN chronically uncontrolled.  He was previously documented as being on lisinopril-HCTZ combination pill but self-discontinued it.  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  Today he presents to the [facility] ED with elevated BP (214/140 mmHg) accompanied by his wife and son after c/o 1 week of chest pressure/ache, headaches, tingling on/off in the left arm and intermittent left eye visual blurring.  Over the last few days he noticed chest pressure along with headaches and sweats.  Chest pressure is reproducible to palpation.  When he presented to the ED his initial ECG showed inferolateral TWI's and the ED staff was initially concerned for acute MI but upon further review of his ECG it appeared that the ECG changes were likely due to LVH with strain pattern.  BP control was recommended after it was reported that he had pain for >12 hours continuously and his ECG shows no ST elevation or injury pattern.  He received morphine IV and NTG-SL in the ED.  He denies speech changes or difficulty with ambulation.  He denies smoking, illicit/IV drug abuse.  In the ED he was eventually started on a NTG infusion for both BP control and relief of angina.  Problem List: Past Medical History  Diagnosis Date  . Hypertension     History reviewed. No pertinent past surgical history.   Allergies: No Known Allergies  Home Medications: Prior to Admission medications   Medication Sig Start Date End Date Taking? Authorizing Provider  lisinopril-hydrochlorothiazide (PRINZIDE,ZESTORETIC) 20-25 MG  per tablet Take 1 tablet by mouth daily.      Historical Provider, MD  penicillin v potassium (VEETID) 250 MG tablet Take 250 mg by mouth once.     Historical Provider, MD    Inpatient Medications:     (Not in a hospital admission)  History reviewed. No pertinent family history.   History   Social History  . Marital Status: Single    Spouse Name: N/A    Number of Children: N/A  . Years of Education: N/A   Occupational History  . Not on file.   Social History Main Topics  . Smoking status: Never Smoker   . Smokeless tobacco: Not on file  . Alcohol Use: No  . Drug Use: No  . Sexually Active:    Other Topics Concern  . Not on file   Social History Narrative  . No narrative on file     Review of Systems: All other systems reviewed and are otherwise negative except as noted above.  Physical Exam: Blood pressure 200/114, pulse 74, temperature 98.6 F (37 C), temperature source Oral, resp. rate 18, SpO2 100.00%. General: Well developed, well nourished, in no acute distress. Head: Normocephalic, atraumatic, sclera non-icteric, no xanthomas, nares are without discharge.  Neck: Negative for carotid bruits. JVD not elevated. Chest/Lungs: Tenderness to palpation over central and left upper chest.  Clear bilaterally to auscultation without wheezes, rales, or rhonchi. Breathing is unlabored. Heart: RRR with S1 S2. No murmurs, rubs, or gallops appreciated. Abdomen: Soft, non-tender, non-distended with normoactive bowel sounds. No hepatomegaly. No rebound/guarding. No obvious abdominal  masses. Msk:  Strength and tone appears normal for age. Extremities: No clubbing, cyanosis or edema.  Distal pedal pulses are 2+ and equal bilaterally. Neuro: Alert and oriented X 3. Moves all extremities spontaneously. Psych:  Responds to questions appropriately with a normal affect.  Labs: No results found for this basename: WBC, HGB, HCT, MCV, PLT,  in the last 72 hours No results found for  this basename: VITAMINB12, FOLATE, FERRITIN, TIBC, IRON, RETICCTPCT,  in the last 72 hours No results found for this basename: DDIMER,  in the last 72 hours No results found for this basename: NA, K, CL, CO2, BUN, CREATININE, CALCIUM, LABALBU, PROT, BILITOT, ALKPHOS, ALT, AST, AMYLASE, LIPASE, GLUCOSE,  in the last 168 hours No results found for this basename: HGBA1C,  in the last 72 hours No results found for this basename: CKTOTAL, CKMB, CKMBINDEX, TROPONINI,  in the last 72 hours No components found with this basename: POCBNP,  No results found for this basename: CHOL, HDL, LDLCALC, TRIG, CHOLHDL, LDLDIRECT,  in the last 72 hours No results found for this basename: TSH, T4TOTAL, FREET3, T3FREE, THYROIDAB,  in the last 72 hours  Radiology/Studies: No results found.  09/25/12 12-lead ECG's:  Initial ECG with SR and inferolateral TWI's possibly concerning for injury vs. Myocarditis.   09/25/12 Repeat 12-lead:  SR, resolution of marked inferior and inferolateral TWI's.  ASSESSMENT:  32 yo AA Male with PMHx of essential HTN chronically uncontrolled.  He was previously documented as being on lisinopril-HCTZ combination pill but self-discontinued it.  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  Today he presents with signs/symptoms concerning for Hypertensive Emergency.   IMPRESSION/PLAN: 1-Admit to CCU service. 2-Hypertensive Emergency:  NTG infusion, monitor BP's closely, Hydralazine IV PRN, adjust home anti-hypertensives prior to discharge, obtain CT Head to rule-out CVA. 3-CTA Chest was performed which ruled out thoracic aortopathy. 4-R/O ACS. 5-Obtain 2D Echo in AM to evaluate LV size, thickness, function and valves. 6-Patient's symptoms and transient ECG changes can be noted with hypertensive agency.  Thus, we will treat the blood pressure as our pain priority.  Patient will need BP optimization  before future non-invasive stress testing is considered.  Code Status:  FULL CODE.  Signed, Christie Nottingham, MD Cardiology Fellow Covering  09/25/2012, 8:34 PM

## 2012-09-25 NOTE — ED Notes (Addendum)
Pt presents w/ left chest pain that radiates to left arm and describes arm pain as numbness. Pt is diaphoretic on assessment, states he has been experiencing intermittent chest pain x1 week, worse on Monday and Tuesday, today progressively worse again. Family hx of mother w/ early MI. Pt also w/ hx of hypertension. Pt denies n/v. Pt is A&ox4, in no acute distress, resp even and unlabored.

## 2012-09-25 NOTE — ED Notes (Signed)
Pt returned from CT, A&Ox4 in no acute distress.

## 2012-09-25 NOTE — ED Notes (Signed)
Present with left sided chest pain that began a few weeks ago, has gotten worse on Monday and Tuesday and is today  Intermittent and dull/pressure, pt is diaphoretic and pain is associated with nausea and fatigue and left arm numbness. EKG shows Acute MI, pt transferred to 35.

## 2012-09-25 NOTE — ED Notes (Signed)
Cardiology MD at bedside.

## 2012-09-26 ENCOUNTER — Inpatient Hospital Stay (HOSPITAL_COMMUNITY): Payer: Medicaid Other

## 2012-09-26 ENCOUNTER — Encounter (HOSPITAL_COMMUNITY): Payer: Self-pay | Admitting: *Deleted

## 2012-09-26 DIAGNOSIS — I10 Essential (primary) hypertension: Principal | ICD-10-CM

## 2012-09-26 DIAGNOSIS — M79609 Pain in unspecified limb: Secondary | ICD-10-CM

## 2012-09-26 DIAGNOSIS — R718 Other abnormality of red blood cells: Secondary | ICD-10-CM

## 2012-09-26 DIAGNOSIS — I379 Nonrheumatic pulmonary valve disorder, unspecified: Secondary | ICD-10-CM

## 2012-09-26 DIAGNOSIS — D691 Qualitative platelet defects: Secondary | ICD-10-CM

## 2012-09-26 HISTORY — DX: Other abnormality of red blood cells: R71.8

## 2012-09-26 HISTORY — DX: Qualitative platelet defects: D69.1

## 2012-09-26 LAB — COMPREHENSIVE METABOLIC PANEL
Alkaline Phosphatase: 89 U/L (ref 39–117)
BUN: 13 mg/dL (ref 6–23)
Chloride: 106 mEq/L (ref 96–112)
GFR calc Af Amer: >90 mL/min (ref >90)
GFR calc non Af Amer: >90 mL/min (ref >90)
Glucose, Bld: 125 mg/dL — ABNORMAL HIGH (ref 70–99)
Potassium: 4.2 mEq/L (ref 3.5–5.1)
Total Bilirubin: 0.4 mg/dL (ref 0.3–1.2)

## 2012-09-26 LAB — MAGNESIUM: Magnesium: 2.2 mg/dL (ref 1.5–2.5)

## 2012-09-26 LAB — CBC WITH DIFFERENTIAL/PLATELET
Basophils Absolute: 0 10*3/uL (ref 0.0–0.1)
Basophils Relative: 0 % (ref 0–1)
Eosinophils Absolute: 0.1 10*3/uL (ref 0.0–0.7)
MCH: 21.4 pg — ABNORMAL LOW (ref 26.0–34.0)
MCHC: 32.7 g/dL (ref 30.0–36.0)
Monocytes Absolute: 0.6 10*3/uL (ref 0.1–1.0)
Neutrophils Relative %: 48 % (ref 43–77)
Platelets: 193 10*3/uL (ref 150–400)
RDW: 16 % — ABNORMAL HIGH (ref 11.5–15.5)

## 2012-09-26 LAB — TROPONIN I
Troponin I: <0.3 ng/mL
Troponin I: <0.3 ng/mL
Troponin I: <0.3 ng/mL

## 2012-09-26 LAB — HEMOGLOBIN A1C
Hgb A1c MFr Bld: 6.1 % — ABNORMAL HIGH
Mean Plasma Glucose: 128 mg/dL — ABNORMAL HIGH

## 2012-09-26 MED ORDER — LISINOPRIL 10 MG PO TABS
10.0000 mg | ORAL_TABLET | Freq: Once | ORAL | Status: AC
  Administered 2012-09-26: 10 mg via ORAL
  Filled 2012-09-26: qty 1

## 2012-09-26 MED ORDER — ALPRAZOLAM 0.25 MG PO TABS
0.2500 mg | ORAL_TABLET | Freq: Two times a day (BID) | ORAL | Status: DC | PRN
  Administered 2012-09-26: 0.25 mg via ORAL
  Filled 2012-09-26: qty 1

## 2012-09-26 MED ORDER — HYDROCHLOROTHIAZIDE 12.5 MG PO CAPS
12.5000 mg | ORAL_CAPSULE | Freq: Every day | ORAL | Status: DC
  Administered 2012-09-26: 12.5 mg via ORAL
  Filled 2012-09-26: qty 1

## 2012-09-26 MED ORDER — LISINOPRIL 10 MG PO TABS
10.0000 mg | ORAL_TABLET | Freq: Every day | ORAL | Status: DC
  Administered 2012-09-26: 10 mg via ORAL
  Filled 2012-09-26: qty 1

## 2012-09-26 MED ORDER — HYDROCHLOROTHIAZIDE 12.5 MG PO CAPS
12.5000 mg | ORAL_CAPSULE | Freq: Once | ORAL | Status: AC
  Administered 2012-09-26: 12.5 mg via ORAL
  Filled 2012-09-26: qty 1

## 2012-09-26 MED ORDER — ACETAMINOPHEN 325 MG PO TABS
650.0000 mg | ORAL_TABLET | ORAL | Status: DC | PRN
  Administered 2012-09-26: 650 mg via ORAL
  Filled 2012-09-26: qty 2

## 2012-09-26 MED ORDER — LISINOPRIL 20 MG PO TABS
20.0000 mg | ORAL_TABLET | Freq: Every day | ORAL | Status: DC
  Filled 2012-09-26: qty 1

## 2012-09-26 MED ORDER — ASPIRIN EC 325 MG PO TBEC
325.0000 mg | DELAYED_RELEASE_TABLET | Freq: Every day | ORAL | Status: DC
  Administered 2012-09-26 – 2012-09-28 (×3): 325 mg via ORAL
  Filled 2012-09-26 (×3): qty 1

## 2012-09-26 MED ORDER — ONDANSETRON HCL 4 MG/2ML IJ SOLN
4.0000 mg | Freq: Once | INTRAMUSCULAR | Status: AC
  Administered 2012-09-26: 4 mg via INTRAVENOUS
  Filled 2012-09-26: qty 2

## 2012-09-26 MED ORDER — SODIUM CHLORIDE 0.9 % IJ SOLN
3.0000 mL | Freq: Two times a day (BID) | INTRAMUSCULAR | Status: DC
  Administered 2012-09-26 – 2012-09-27 (×5): 3 mL via INTRAVENOUS

## 2012-09-26 MED ORDER — HYDRALAZINE HCL 20 MG/ML IJ SOLN
10.0000 mg | Freq: Four times a day (QID) | INTRAMUSCULAR | Status: DC | PRN
  Administered 2012-09-26 (×2): 10 mg via INTRAVENOUS
  Filled 2012-09-26 (×2): qty 1

## 2012-09-26 MED ORDER — ONDANSETRON HCL 4 MG/2ML IJ SOLN
4.0000 mg | Freq: Four times a day (QID) | INTRAMUSCULAR | Status: DC | PRN
  Administered 2012-09-26: 4 mg via INTRAVENOUS
  Filled 2012-09-26: qty 2

## 2012-09-26 MED ORDER — HYDROCHLOROTHIAZIDE 25 MG PO TABS
25.0000 mg | ORAL_TABLET | Freq: Every day | ORAL | Status: DC
  Filled 2012-09-26: qty 1

## 2012-09-26 MED ORDER — ZOLPIDEM TARTRATE 5 MG PO TABS
5.0000 mg | ORAL_TABLET | Freq: Every evening | ORAL | Status: DC | PRN
  Filled 2012-09-26: qty 1

## 2012-09-26 NOTE — Care Management Note (Signed)
    Page 1 of 1   09/26/2012     11:00:19 AM   CARE MANAGEMENT NOTE 09/26/2012  Patient:  Darren Little, Darren Little   Account Number:  [id number]  Date Initiated:  09/26/2012  Documentation initiated by:  Junius Creamer  Subjective/Objective Assessment:   adm w  htn emergency     Action/Plan:   lives w fam   Anticipated DC Date:     Anticipated DC Plan:  HOME/SELF CARE      DC Planning Services  CM consult      Choice offered to / List presented to:             Status of service:   Medicare Important Message given?   (If response is "NO", the following Medicare IM given date fields will be blank) Date Medicare IM given:   Date Additional Medicare IM given:    Discharge Disposition:    Per UR Regulation:  Reviewed for med. necessity/level of care/duration of stay  If discussed at Long Length of Stay Meetings, dates discussed:    Comments:  6/27 1058 debbie Nicholos Aloisi rn,bsn gave pt inf on prim care resource list in [place] gave pt 2 prescription discount cards that may help w brand name meds. pt on lisinopril and hctz which are on 4.00 list at [facility].

## 2012-09-26 NOTE — Progress Notes (Signed)
Nutrition Brief Note  Malnutrition Screening Tool result is inaccurate.  Please consult if nutrition needs are identified.  Aris Moman Kowalski RD, LDN Pager #[phone] After Hours pager #[phone]   

## 2012-09-26 NOTE — ED Notes (Signed)
Pt c/o HA and has begun to vomit - Dr. Haskell Callas paged and notified - orders given for IV zofran and to decrease nitroglycerin drip to 34mcg/hr.

## 2012-09-26 NOTE — Progress Notes (Signed)
  Echocardiogram 2D Echocardiogram has been performed.  Darren Little 09/26/2012, 10:20 AM

## 2012-09-26 NOTE — Progress Notes (Signed)
*  PRELIMINARY RESULTS* Vascular Ultrasound Left lower extremity venous duplex has been completed.  Preliminary findings: left = negative for DVT.    Farrel Demark, RDMS, RVT  09/26/2012, 3:25 PM

## 2012-09-26 NOTE — Plan of Care (Signed)
Problem: Phase I Progression Outcomes Goal: OOB as tolerated unless otherwise ordered Outcome: Not Progressing Pt on ordered bedrest.

## 2012-09-26 NOTE — Progress Notes (Addendum)
   Subjective:  Denies CP or dyspnea; complains of left calf pain   Objective:  Filed Vitals:   09/26/12 0545 09/26/12 0600 09/26/12 0615 09/26/12 0700  BP: 133/92 162/97 152/87 138/91  Pulse: 60 61 58 56  Temp:      TempSrc:      Resp: 16 13 14 14   Height:      Weight:      SpO2: 97% 97% 97% 94%    Intake/Output from previous day:  Intake/Output Summary (Last 24 hours) at 09/26/12 0734 Last data filed at 09/26/12 0600  Gross per 24 hour  Intake 160.85 ml  Output      0 ml  Net 160.85 ml    Physical Exam: Physical exam: Well-developed well-nourished in no acute distress.  Skin is warm and dry.  HEENT is normal.  Neck is supple.  Chest is clear to auscultation with normal expansion.  Cardiovascular exam is regular rate and rhythm.  Abdominal exam nontender or distended. No masses palpated. Extremities show no edema. neuro grossly intact    Lab Results: Basic Metabolic Panel:  Recent Labs  21/30/86 2019 09/26/12 0345  NA 139 139  K 3.7 4.2  CL 104 106  CO2 27 26  GLUCOSE 133* 125*  BUN 15 13  CREATININE 1.03 1.00  CALCIUM 8.6 8.4  MG  --  2.2  PHOS  --  2.9   CBC:  Recent Labs  09/25/12 2019 09/26/12 0345  WBC 5.8 4.3  NEUTROABS 2.4 2.1  HGB 13.5 11.8*  HCT 39.8 36.1*  MCV 64.9* 65.5*  PLT 219 193   Cardiac Enzymes:  Recent Labs  09/26/12 0345  TROPONINI <0.30     Assessment/Plan:  1 hypertensive urgency- patient's blood pressure has improved. Discontinue nitroglycerin. Add lisinopril 10 mg daily and HCTZ 12.5 mg daily. Increase as needed for blood pressure control. Await echocardiogram to assess LV function. 2 calf pain-patient complains of left calf pain. Schedule venous Dopplers to exclude DVT. 3 ECG changes-patient with T-wave inversions on admission most likely from hypertensive emergency. Once his blood pressure is controlled plan outpatient functional study. 4 microcytosis-patient denies any history of hematologic disorder. He  will need close followup with primary care following discharge for this issue. He may require hematology evaluation. Guaiac all stool. 5 abnormal chest CT-lymphadenopathy-followup study with primary care after discharge.  Olga Millers 09/26/2012, 7:34 AM

## 2012-09-27 DIAGNOSIS — I517 Cardiomegaly: Secondary | ICD-10-CM

## 2012-09-27 DIAGNOSIS — R718 Other abnormality of red blood cells: Secondary | ICD-10-CM

## 2012-09-27 DIAGNOSIS — I161 Hypertensive emergency: Secondary | ICD-10-CM

## 2012-09-27 DIAGNOSIS — D691 Qualitative platelet defects: Secondary | ICD-10-CM

## 2012-09-27 DIAGNOSIS — R7303 Prediabetes: Secondary | ICD-10-CM

## 2012-09-27 DIAGNOSIS — D509 Iron deficiency anemia, unspecified: Secondary | ICD-10-CM

## 2012-09-27 DIAGNOSIS — M79662 Pain in left lower leg: Secondary | ICD-10-CM

## 2012-09-27 DIAGNOSIS — R9389 Abnormal findings on diagnostic imaging of other specified body structures: Secondary | ICD-10-CM

## 2012-09-27 DIAGNOSIS — E669 Obesity, unspecified: Secondary | ICD-10-CM

## 2012-09-27 DIAGNOSIS — E66812 Obesity, class 2: Secondary | ICD-10-CM

## 2012-09-27 HISTORY — DX: Obesity, class 2: E66.812

## 2012-09-27 HISTORY — DX: Other abnormality of red blood cells: R71.8

## 2012-09-27 HISTORY — DX: Hypertensive emergency: I16.1

## 2012-09-27 HISTORY — DX: Abnormal findings on diagnostic imaging of other specified body structures: R93.89

## 2012-09-27 HISTORY — DX: Iron deficiency anemia, unspecified: D50.9

## 2012-09-27 HISTORY — DX: Pain in left lower leg: M79.662

## 2012-09-27 HISTORY — DX: Morbid (severe) obesity due to excess calories: E66.01

## 2012-09-27 LAB — CBC WITH DIFFERENTIAL/PLATELET
Basophils Absolute: 0 10*3/uL (ref 0.0–0.1)
HCT: 41.2 % (ref 39.0–52.0)
Lymphocytes Relative: 26 % (ref 12–46)
Monocytes Relative: 14 % — ABNORMAL HIGH (ref 3–12)
Neutro Abs: 3.2 10*3/uL (ref 1.7–7.7)
Platelets: 218 10*3/uL (ref 150–400)
RDW: 16.2 % — ABNORMAL HIGH (ref 11.5–15.5)
WBC: 5.5 10*3/uL (ref 4.0–10.5)

## 2012-09-27 LAB — PHOSPHORUS: Phosphorus: 3.8 mg/dL (ref 2.3–4.6)

## 2012-09-27 LAB — BASIC METABOLIC PANEL
GFR calc non Af Amer: >90 mL/min (ref >90)
Glucose, Bld: 111 mg/dL — ABNORMAL HIGH (ref 70–99)
Potassium: 4 mEq/L (ref 3.5–5.1)
Sodium: 137 mEq/L (ref 135–145)

## 2012-09-27 MED ORDER — HYDROCHLOROTHIAZIDE 25 MG PO TABS
25.0000 mg | ORAL_TABLET | Freq: Every day | ORAL | Status: DC
  Administered 2012-09-27 – 2012-09-28 (×2): 25 mg via ORAL
  Filled 2012-09-27 (×3): qty 1

## 2012-09-27 MED ORDER — HYDRALAZINE HCL 20 MG/ML IJ SOLN: 20.0000 mg | Freq: Four times a day (QID) | INTRAMUSCULAR | Status: DC | PRN

## 2012-09-27 MED ORDER — AMLODIPINE BESYLATE 5 MG PO TABS
5.0000 mg | ORAL_TABLET | Freq: Every day | ORAL | Status: DC
  Administered 2012-09-27 – 2012-09-28 (×2): 5 mg via ORAL
  Filled 2012-09-27 (×4): qty 1

## 2012-09-27 MED ORDER — LISINOPRIL 20 MG PO TABS
20.0000 mg | ORAL_TABLET | Freq: Every day | ORAL | Status: DC
  Administered 2012-09-27 – 2012-09-28 (×2): 20 mg via ORAL
  Filled 2012-09-27 (×3): qty 1

## 2012-09-27 MED ORDER — MAGNESIUM HYDROXIDE 400 MG/5ML PO SUSP: 30.0000 mL | Freq: Every day | ORAL | Status: DC | PRN

## 2012-09-27 NOTE — Progress Notes (Signed)
BP 190/112 after PRN apresoline.  Dr. Adolm Joseph notified of all parameters.  Orders rec'd.

## 2012-09-27 NOTE — Progress Notes (Signed)
Patient Name: Darren Little Date of Encounter: 09/27/2012     Principal Problem:   Hypertensive emergency Active Problems:   Microcytosis   Abnormal CT of the chest   Abnormal platelets   LVH (left ventricular hypertrophy)   Prediabetes   Abnormal RBC morphology   Pain of left calf   Obesity    SUBJECTIVE: Feels well this AM. No chest pain, blurry vision or shortness of breath.    OBJECTIVE  Filed Vitals:   09/27/12 0300 09/27/12 0400 09/27/12 0500 09/27/12 0600  BP: 144/95 160/100 144/92 175/108  Pulse:      Temp:  97.5 F (36.4 C)    TempSrc:  Oral    Resp: 20 16 16 16   Height:      Weight:      SpO2:        Intake/Output Summary (Last 24 hours) at 09/27/12 1610 Last data filed at 09/26/12 1700  Gross per 24 hour  Intake    483 ml  Output   1900 ml  Net  -1417 ml   Weight change:   PHYSICAL EXAM  General: Well developed, well nourished, in no acute distress. Head: Normocephalic, atraumatic, sclera non-icteric, no xanthomas, nares are without discharge.  Neck: Supple without bruits or JVD. Lungs:  Resp regular and unlabored, CTA. Heart: RRR no s3, s4, or murmurs. Abdomen: Soft, non-tender, non-distended, BS + x 4.  Msk:  Strength and tone appears normal for age. Extremities: No clubbing, cyanosis or edema. DP/PT/Radials 2+ and equal bilaterally. Neuro: Alert and oriented X 3. Moves all extremities spontaneously. Psych: Normal affect.  LABS:  Recent Labs     09/26/12  0345  09/27/12  0535  WBC  4.3  5.5  HGB  11.8*  13.7  HCT  36.1*  41.2  MCV  65.5*  65.2*  PLT  193  218    Recent Labs Lab 09/25/12 2019 09/26/12 0345 09/27/12 0535  NA 139 139 137  K 3.7 4.2 4.0  CL 104 106 102  CO2 27 26 27   BUN 15 13 12   CREATININE 1.03 1.00 1.06  CALCIUM 8.6 8.4 9.1  PROT 7.6 6.8  --   BILITOT 0.2* 0.4  --   ALKPHOS 104 89  --   ALT 16 15  --   AST 27 22  --   GLUCOSE 133* 125* 111*   Recent Labs     09/26/12  0345  HGBA1C  6.1*    Recent Labs     09/26/12  0345  09/26/12  0910  09/26/12  1431  TROPONINI  <0.30  <0.30  <0.30   Recent Labs  09/26/12 0345  TSH 1.162   TELE: NSR, 60-70 bpm  Radiology/Studies:  Ct Head Wo Contrast  09/26/2012   *RADIOLOGY REPORT*  Clinical Data: Chest pain.  Left-sided numbness.  Headache.  CT HEAD WITHOUT CONTRAST  Technique:  Contiguous axial images were obtained from the base of the skull through the vertex without contrast.  Comparison: None.  Findings: No acute intracranial abnormality.  Specifically, no hemorrhage, hydrocephalus, mass lesion, acute infarction, or significant intracranial injury.  No acute calvarial abnormality. Visualized paranasal sinuses and mastoids clear.  Orbital soft tissues unremarkable.  IMPRESSION: Normal study.   Original Report Authenticated By: Charlett Nose, M.D.   Ct Angio Chest Pe W/cm &/or Wo Cm  09/25/2012   *RADIOLOGY REPORT*  Clinical Data: Left-sided chest pain.  Shortness of breath, diaphoresis.  CT ANGIOGRAPHY CHEST  Technique:  Multidetector CT imaging of the chest using the standard protocol during bolus administration of intravenous contrast. Multiplanar reconstructed images including MIPs were obtained and reviewed to evaluate the vascular anatomy.  Contrast: OMNIPAQUE IOHEXOL 350 MG/ML SOLN  Comparison: None.  Findings: No filling defects in the pulmonary arteries to suggest pulmonary emboli.  Heart is borderline enlarged.  Aorta is normal caliber.  There are mildly enlarged right axillary lymph nodes.  Index node has a short axis diameter of 16 mm on image 20.  Scattered small mediastinal lymph nodes, none pathologically enlarged.  Linear soft tissue in the anterior mediastinum felt to represent residual thymus.  Mild vascular congestion noted within the lungs without confluent opacity, effusion or edema.  Visualized thyroid and chest Hazell Siwik soft tissues unremarkable. Imaging into the upper abdomen shows no acute findings.  IMPRESSION: No  evidence of pulmonary embolus.  Borderline heart size, vascular congestion.  Right axillary adenopathy.  While this may be reactive, I cannot exclude a lymphoproliferative disorder or other neoplasm. Recommend clinical correlation and clinical follow-up.   Original Report Authenticated By: Charlett Nose, M.D.   Dg Chest Port 1 View  09/25/2012   *RADIOLOGY REPORT*  Clinical Data: Chest pain and numbness  PORTABLE CHEST - 1 VIEW  Comparison: Prior chest x-ray 01/16/2009  Findings: Given differences in technique, similar degree of borderline cardiomegaly.  Lung volumes are low.  Left retrocardiac opacity may reflect atelectasis or infiltrate.  No pulmonary edema, pneumothorax or large effusion.  No acute osseous abnormality.  IMPRESSION:  1.  Left retrocardiac opacity may reflect atelectasis or infiltrate. 2.  Stable borderline cardiomegaly.   Original Report Authenticated By: Malachy Moan, M.D.    Current Medications:  . aspirin EC  325 mg Oral Daily  . hydrochlorothiazide  25 mg Oral Daily  . lisinopril  20 mg Oral Daily  . sodium chloride  3 mL Intravenous Q12H    ASSESSMENT AND PLAN:  32 yo AA male with no prior cardiac history, PMHx significant for longstanding, uncontrolled HTN who was admitted to [facility] on 09/25/12 for hypertensive emergency manifested as chest pain, blurred vision and headaches.   1. Hypertensive emergency/uncontrolled hypertension- pressures have improved somewhat. They are better controlled while asleep (129-144/74-95). In the 170-190/100s range while awake. ACEi/HCTZ increased, has not yet received a dose this AM. Suspect a component of anxiety associated with this also. TSH WNL. TnI WNL x 4.   - Give increased lisinopril and HCTZ doses now  - Add Norvasc 5 mg PO daily (African American)  - Transfer to telemetry  - Would benefit from anxiolytic (1 week Rx at discharge until PCP follow-up)  - Continue to monitor BP  2. Microcytosis with elliptocytes and  large platelets of differential- noted on CBC on admission and yesterday. Borderline anemic on CBC yesterday (? hemodilution). No BM since admission. If able to void today, check FOBT. If not, recommend PCP follow-up for further eval +/- hematology referral  3. Axillary lymphadenopathy on CT-A chest- suspected to be reactive; however, lymphoproliferative d/o or other neoplasm unable to be ruled out.   - Follow-up PCP for further eval  4. Moderate LVH- noted on echo. Sequelae of longstanding HTN. Manifested on EKG as well. EF 55-60%, mild LA dilatation.   5. Prediabetes- Hgb A1C 6.1%.  - Diet & exercise   - Follow PCP for continued monitoring  6. Left calf pain- preliminary LLE duplex u/s negative for DVT.   7. Obesity  - Diet & exercise  Signed, R. Hurman Horn, PA-C 09/27/2012, 8:33 AM Patient examined and agree except changes made. He will definitely need a calcium channel blocker so we'll begin 5 mg of amlodipine. I discussed the importance of taking his meds to avoid complications including stroke, heart failure, and renal failure. Also talked to he and his mother about hidden salt and avoiding fast foods. He needs to lose some weight to avoid becoming diabetic. We'll try to send home tomorrow if blood pressure under better control. He's also been advised to shop around for prices of drugs.  Valera Castle, MD 09/27/2012 9:04 AM

## 2012-09-28 ENCOUNTER — Encounter (HOSPITAL_COMMUNITY): Payer: Self-pay | Admitting: Physician Assistant

## 2012-09-28 DIAGNOSIS — R9389 Abnormal findings on diagnostic imaging of other specified body structures: Secondary | ICD-10-CM

## 2012-09-28 DIAGNOSIS — R718 Other abnormality of red blood cells: Secondary | ICD-10-CM

## 2012-09-28 LAB — CBC WITH DIFFERENTIAL/PLATELET
Basophils Absolute: 0 10*3/uL (ref 0.0–0.1)
Eosinophils Relative: 2 % (ref 0–5)
Monocytes Absolute: 0.6 10*3/uL (ref 0.1–1.0)
Monocytes Relative: 14 % — ABNORMAL HIGH (ref 3–12)
Neutrophils Relative %: 54 % (ref 43–77)
Platelets: 217 10*3/uL (ref 150–400)
RBC: 6.3 MIL/uL — ABNORMAL HIGH (ref 4.22–5.81)
WBC: 4.6 10*3/uL (ref 4.0–10.5)

## 2012-09-28 LAB — BASIC METABOLIC PANEL
Chloride: 101 mEq/L (ref 96–112)
GFR calc Af Amer: 87 mL/min — ABNORMAL LOW (ref >90)
Potassium: 4 mEq/L (ref 3.5–5.1)
Sodium: 137 mEq/L (ref 135–145)

## 2012-09-28 LAB — MAGNESIUM: Magnesium: 2.2 mg/dL (ref 1.5–2.5)

## 2012-09-28 MED ORDER — AMLODIPINE BESYLATE 5 MG PO TABS: 5.0000 mg | ORAL_TABLET | Freq: Every day | ORAL | Status: DC

## 2012-09-28 MED ORDER — HYDROCHLOROTHIAZIDE 25 MG PO TABS: 25.0000 mg | ORAL_TABLET | Freq: Every day | ORAL | Status: DC

## 2012-09-28 MED ORDER — LISINOPRIL 20 MG PO TABS: 20.0000 mg | ORAL_TABLET | Freq: Every day | ORAL | Status: DC

## 2012-09-28 NOTE — Progress Notes (Signed)
   Primary cardiologist: Dr. Olga Millers  Subjective:   Feels much better. No leg pain or chest pain. No dizziness. Ready to go home.   Objective:   Temp:  [98.1 F (36.7 C)-98.8 F (37.1 C)] 98.2 F (36.8 C) (06/29 0601) Pulse Rate:  [68-80] 68 (06/29 0601) Resp:  [18-20] 18 (06/29 0601) BP: (126-171)/(77-107) 126/91 mmHg (06/29 0601) SpO2:  [99 %-100 %] 99 % (06/29 0601) Weight:  [262 lb 1.6 oz (118.888 kg)] 262 lb 1.6 oz (118.888 kg) (06/29 0601) Last BM Date: 09/25/12  Filed Weights   09/26/12 0313 09/28/12 0601  Weight: 266 lb 1.5 oz (120.7 kg) 262 lb 1.6 oz (118.888 kg)    Intake/Output Summary (Last 24 hours) at 09/28/12 0933 Last data filed at 09/27/12 1300  Gross per 24 hour  Intake    240 ml  Output      0 ml  Net    240 ml    Exam:  General: Comfortable at rest.  Lungs: Clear, nonlabored.  Cardiac: RRR, no gallop.  Extremities: No edema.  Lab Results:  Basic Metabolic Panel:  Recent Labs Lab 09/26/12 0345 09/27/12 0535 09/28/12 0535  NA 139 137 137  K 4.2 4.0 4.0  CL 106 102 101  CO2 26 27 29   GLUCOSE 125* 111* 105*  BUN 13 12 15   CREATININE 1.00 1.06 1.23  CALCIUM 8.4 9.1 9.0  MG 2.2 2.2 2.2    CBC:  Recent Labs Lab 09/26/12 0345 09/27/12 0535 09/28/12 0535  WBC 4.3 5.5 4.6  HGB 11.8* 13.7 13.6  HCT 36.1* 41.2 41.2  MCV 65.5* 65.2* 65.4*  PLT 193 218 217    Cardiac Enzymes:  Recent Labs Lab 09/26/12 0345 09/26/12 0910 09/26/12 1431  TROPONINI <0.30 <0.30 <0.30    Echocardiogram 6/27: Study Conclusions  - Left ventricle: The cavity size was normal. Wall thickness was increased in a pattern of moderate LVH. Systolic function was normal. The estimated ejection fraction was in the range of 55% to 60%. Left ventricular diastolic function parameters were normal. - Left atrium: The atrium was mildly dilated.    Medications:   Scheduled Medications: . amLODipine  5 mg Oral Daily  . aspirin EC  325 mg Oral  Daily  . hydrochlorothiazide  25 mg Oral Daily  . lisinopril  20 mg Oral Daily  . sodium chloride  3 mL Intravenous Q12H      PRN Medications:  acetaminophen, ALPRAZolam, hydrALAZINE, magnesium hydroxide, ondansetron (ZOFRAN) IV, zolpidem   Assessment:   1. Hypertensive urgency, blood pressure trend better. Medications have been modified during hospitalization. No evidence of ACS, echocardiogram with normal LVEF 55-60%.  2. Microcytosis by CBC, will need follow up with primary care provider. Current hemoglobin normal.  3. Right axillary adenopathy incidentally noted by chest CT, will need followup with primary care provider.   Plan/Discussion:    Patient ready for discharge. At the present time, he does not have a regular primary care doctor. Does have a list and states that he plans to establish with one soon. Continue current antihypertensives as an outpatient, no clear indication to stay on aspirin long-term however. Would at least make a followup visit with Dr. Jens Som or PA in the next few weeks to make sure that he is clinically stable, pending establishment with a primary care provider. He needs a work excuse to cover the dates of his hospitalization.   Jonelle Sidle, M.D., F.A.C.C.

## 2012-09-28 NOTE — Discharge Summary (Signed)
Discharge Summary   Patient ID: Darren Little,  MRN: [id number], DOB/AGE: [birth year] 32 y.o.  Admit date: 09/25/2012 Discharge date: 09/28/2012  Primary Physician: No primary provider on file. Primary Cardiologist: previously unassigned; Velta Addison, MD evaluated after admission  Discharge Diagnoses Principal Problem:   Hypertensive emergency Active Problems:   Microcytosis   Abnormal CT of the chest   Abnormal platelets   LVH (left ventricular hypertrophy)   Prediabetes   Abnormal RBC morphology   Pain of left calf   Obesity  Allergies Allergies  Allergen Reactions  . Bee Venom Swelling  . Other Other (See Comments)    Squash- hives   Diagnostic Studies/Procedures  PORTABLE CHEST X-RAY - 09/25/12  IMPRESSION:  1. Left retrocardiac opacity may reflect atelectasis or  infiltrate.  2. Stable borderline cardiomegaly.  CT ANGIO CHEST - 09/25/12  IMPRESSION:  No evidence of pulmonary embolus. Borderline heart size, vascular congestion.  Right axillary adenopathy. While this may be reactive, I cannot exclude a lymphoproliferative disorder or other neoplasm. Recommend clinical correlation and clinical follow-up.  NONCONTRAST HEAD CT - 09/26/12  IMPRESSION:  Normal study.  TRANSTHORACIC ECHOCARDIOGRAM - 09/26/12  - Left ventricle: The cavity size was normal. Wall thickness was increased in a pattern of moderate LVH. Systolic function was normal. The estimated ejection fraction was in the range of 55% to 60%. Left ventricular diastolic function parameters were normal. - Left atrium: The atrium was mildly dilated.  BILATERAL LOWER EXTREMITY VENOUS DUPLEX - 09/26/12  - No evidence of deep vein thrombosis involving the left lower extremity.  - No evidence of Baker's cyst on the left.  History of Present Illness  Darren Little is a 32 y.o. male who was admitted to [facility] hospital on 09/26/12 with the above problem list.   He is a 32 year old African American  male with no prior cardiac history, PMHx significant for longstanding, uncontrolled HTN who was admitted to [facility] on 09/25/12 for hypertensive emergency manifested as chest pain, blurred vision and headaches.   In the ED, initial EKG revealed inferolateral T-wave inversions consistent with LVH strain pattern. Initial troponin I returned within normal limits. Chest x-ray as above did reveal retrocardiac opacity and otherwise no acute abnormalities. Urine drug screen was negative. Basic metabolic panel is unremarkable. CBC did reveal microcytosis (MCV 64.9). Hemoglobin and hematocrit are within normal limits. Platelets and white blood cells were within normal limits. The patient was placed on IV nitroprusside and admitted for hypertensive emergency.  Hospital Course   He obtained a CT angiogram of the chest which revealed no evidence of PE however did indicate right axillary adenopathy felt to represent a reactive process, however neoplasm or lymphoproliferative disorder cannot be excluded. A noncontrast head CT revealed no acute abnormality. Three subsequent sets of troponin I returned within normal limits. TSH returned within normal limits. A followup CBC did reveal both abnormal platelet and RBC morphologies (large platelets, lipocytes). Hemoglobin A1c returned at 6.1% indicating prediabetes.   The following day the patient did complain of left calf pain. He underwent lower extremity venous duplex ultrasounds which revealed no evidence of DVT. HCTZ and ACE inhibitor were added and NTG IV was able to be weaned. Hydralazine IV when necessary was continued. The patient's blood pressure down trended, however remained mildly hypertensive. Thus, Norvasc 5 mg by mouth daily was added. A 2-D echocardiogram was performed revealing EF 55-60%, moderate LVH and mild LA dilatation. He was able to be transferred from CCU to  telemetry. He had no further symptoms. He was evaluated by Dr. Diona Browner today and was  deemed stable for discharge.  He was visited by case management this admission to provide the patient with resources to establish with a primary care provider. He will followup with his PCP within one week. He has been advised to followup for microcytosis, abnormal blood cell morphologies and abnormalities on chest x-ray and CT scan this admission. He will followup in our clinic in 2-4 weeks. He will be discharged on the medication regimen outlined below. Given no evidence of coronary disease, long-term aspirin was discontinued. This information, including supplemental hypertension management material, has been clearly outlined in the discharge AVS.   Discharge Vitals:  Blood pressure 126/91, pulse 68, temperature 98.2 F (36.8 C), temperature source Oral, resp. rate 18, height 6' (1.829 m), weight 118.888 kg (262 lb 1.6 oz), SpO2 99.00%.   Labs: Recent Labs     09/27/12  0535  09/28/12  0535  WBC  5.5  4.6  HGB  13.7  13.6  HCT  41.2  41.2  MCV  65.2*  65.4*  PLT  218  217    Recent Labs Lab 09/26/12 0345 09/27/12 0535 09/28/12 0535  NA 139 137 137  K 4.2 4.0 4.0  CL 106 102 101  CO2 26 27 29   BUN 13 12 15   CREATININE 1.00 1.06 1.23  CALCIUM 8.4 9.1 9.0  PROT 6.8  --   --   BILITOT 0.4  --   --   ALKPHOS 89  --   --   ALT 15  --   --   AST 22  --   --   GLUCOSE 125* 111* 105*   Recent Labs     09/26/12  0345  HGBA1C  6.1*   Recent Labs     09/26/12  0345  09/26/12  0910  09/26/12  1431  TROPONINI  <0.30  <0.30  <0.30    Recent Labs  09/26/12 0345  TSH 1.162    Disposition:  Discharge Orders   Future Orders Complete By Expires     Diet - low sodium heart healthy  As directed           Follow-up Information   Follow up In 1 week. (Please establish with a primary care provider and follow-up within 1 week of hospital discharge. )       Follow up with [facility]. (Office will call with an appointment date & time to be schedule in about 2-4  weeks)    Contact information:   [address] [place] [address]       Discharge Medications:    Medication List    STOP taking these medications       naproxen sodium 220 MG tablet  Commonly known as:  ANAPROX      TAKE these medications       amLODipine 5 MG tablet  Commonly known as:  NORVASC  Take 1 tablet (5 mg total) by mouth daily.     hydrochlorothiazide 25 MG tablet  Commonly known as:  HYDRODIURIL  Take 1 tablet (25 mg total) by mouth daily.     lisinopril 20 MG tablet  Commonly known as:  PRINIVIL,ZESTRIL  Take 1 tablet (20 mg total) by mouth daily.       Outstanding Labs/Studies: None  Duration of Discharge Encounter: Greater than 30 minutes including physician time.  Signed, R. Hurman Horn, PA-C 09/28/2012, 11:46 AM

## 2012-09-28 NOTE — Discharge Summary (Signed)
Please see also my rounding note. 

## 2012-10-16 ENCOUNTER — Ambulatory Visit (INDEPENDENT_AMBULATORY_CARE_PROVIDER_SITE_OTHER): Payer: Self-pay | Admitting: Physician Assistant

## 2012-10-16 ENCOUNTER — Encounter: Payer: Self-pay | Admitting: *Deleted

## 2012-10-16 ENCOUNTER — Encounter: Payer: Self-pay | Admitting: Physician Assistant

## 2012-10-16 VITALS — BP 130/90 | HR 68 | Ht 72.0 in | Wt 258.0 lb

## 2012-10-16 DIAGNOSIS — R590 Localized enlarged lymph nodes: Secondary | ICD-10-CM

## 2012-10-16 DIAGNOSIS — R0609 Other forms of dyspnea: Secondary | ICD-10-CM

## 2012-10-16 DIAGNOSIS — R0602 Shortness of breath: Secondary | ICD-10-CM

## 2012-10-16 DIAGNOSIS — R7302 Impaired glucose tolerance (oral): Secondary | ICD-10-CM

## 2012-10-16 DIAGNOSIS — R599 Enlarged lymph nodes, unspecified: Secondary | ICD-10-CM

## 2012-10-16 DIAGNOSIS — R002 Palpitations: Secondary | ICD-10-CM

## 2012-10-16 DIAGNOSIS — R718 Other abnormality of red blood cells: Secondary | ICD-10-CM

## 2012-10-16 DIAGNOSIS — R7309 Other abnormal glucose: Secondary | ICD-10-CM

## 2012-10-16 DIAGNOSIS — I10 Essential (primary) hypertension: Secondary | ICD-10-CM

## 2012-10-16 DIAGNOSIS — R079 Chest pain, unspecified: Secondary | ICD-10-CM

## 2012-10-16 DIAGNOSIS — R0683 Snoring: Secondary | ICD-10-CM

## 2012-10-16 NOTE — Patient Instructions (Addendum)
Will obtain labs today and call you with the results (bmet/bnp)  Your physician has requested that you have en exercise stress myoview. For further information please visit [email or web address] Please follow instruction sheet, as given.  Your physician has recommended that you wear a holter monitor. Holter monitors are medical devices that record the heart's electrical activity. Doctors most often use these monitors to diagnose arrhythmias. Arrhythmias are problems with the speed or rhythm of the heartbeat. The monitor is a small, portable device. You can wear one while you do your normal daily activities. This is usually used to diagnose what is causing palpitations/syncope (passing out).  Your physician has recommended that you have a sleep study. This test records several body functions during sleep, including: brain activity, eye movement, oxygen and carbon dioxide blood levels, heart rate and rhythm, breathing rate and rhythm, the flow of air through your mouth and nose, snoring, body muscle movements, and chest and belly movement.  Your physician recommends that you schedule a follow-up appointment in: 1 month with Scott W PA  YOU NEED TO GET ESTABLISHED WITH A PRIMARY CARE DOCTOR FOR MICROCYTOSIS AND RIGHT AXILLARY ADENOPATHY

## 2012-10-16 NOTE — Progress Notes (Signed)
[address] [place], [place]  [address] Phone: [phone] Fax:  [phone]  Date:  10/16/2012   ID:  Darren Little, DOB [birth year], MRN [id number]  PCP:  No primary provider on file.  Cardiologist:  Dr. Olga Millers     History of Present Illness: Darren Little is a 32 y.o. male who returns for follow up after a recent admission to the hospital with hypertensive urgency 6/26-6/29. He has a history of hypertension that is largely untreated. He presented to the hospital with chest pain, blurred vision, headaches. ECG was abnormal with probable LVH with strain.  Chest CT was negative for pulmonary embolism. He did have evidence of right axillary adenopathy of unknown significance. He was placed on multiple antihypertensives with better controlled blood pressures. Echocardiogram 09/26/12: Moderate LVH, EF 55-60%, normal diastolic function, mild LAE. Lab work did demonstrate significant microcytosis with normal hemoglobin. No iron studies were performed or hemoglobin electrophoresis. It was recommended that he follow up with PCP for his axillary adenopathy and microcytosis. He had some left calf pain and left LE [place] was negative for DVT.  Dr. Jens Som did recommend pursuing outpatient stress test once his blood pressure is better controlled.  Since discharge, he continues to note occasional chest pain. He does notice it with exertion.  It is a pinching sensation.  No radiating pain.  He has assoc dyspnea, nausea.  He also notes occasional fluttering in his chest.  No syncope. No orthopnea.  He has awoken in the middle of the night with dyspnea (? Apnea).  He does note DOE.  He is probably NYHA Class IIb.  He feels tired.  He does snore.    Labs (6/14):  K 4, Cr 1.23, ALT 15, Hgb 13.6, MCV 65.4, Plt 217K, A1c, TSH 1.162  Wt Readings from Last 3 Encounters:  10/16/12 258 lb (117.028 kg)  09/28/12 262 lb 1.6 oz (118.888 kg)     Past Medical History  Diagnosis Date    . Hypertension   . Prediabetes     A1C 6.1% 08/2012  . Microcytosis     MCV 65  . Abnormal CT scan 08/2012    R axilla adenopathy  . LVH (left ventricular hypertrophy)     08/2712 echo-EF 55-60%, moderate LVH and mild LA dilatation.   . Abnormal platelets 09/26/12    Large platelets  . Abnormal RBC 09/26/12    Elliptocytes    Current Outpatient Prescriptions  Medication Sig Dispense Refill  . amLODipine (NORVASC) 5 MG tablet Take 1 tablet (5 mg total) by mouth daily.  30 tablet  3  . hydrochlorothiazide (HYDRODIURIL) 25 MG tablet Take 1 tablet (25 mg total) by mouth daily.  30 tablet  3  . lisinopril (PRINIVIL,ZESTRIL) 20 MG tablet Take 1 tablet (20 mg total) by mouth daily.  30 tablet  3   No current facility-administered medications for this visit.    Allergies:    Allergies  Allergen Reactions  . Bee Venom Swelling  . Other Other (See Comments)    Squash- hives    Social History:  The patient  reports that he has never smoked. He does not have any smokeless tobacco history on file. He reports that he does not drink alcohol or use illicit drugs.   ROS:  Please see the history of present illness.      All other systems reviewed and negative.   PHYSICAL EXAM: VS:  BP 130/90  Pulse 68  Ht  6' (1.829 m)  Wt 258 lb (117.028 kg)  BMI 34.98 kg/m2 Well nourished, well developed, in no acute distress HEENT: normal Neck: no JVD Cardiac:  normal S1, S2; RRR; no murmur Lungs:  clear to auscultation bilaterally, no wheezing, rhonchi or rales Abd: soft, nontender, no hepatomegaly Ext: no edema Skin: warm and dry Neuro:  CNs 2-12 intact, no focal abnormalities noted  EKG:  NSR, HR 69, normal axis, LVH with repolarization abnormality     ASSESSMENT AND PLAN:  1. Chest Pain:  He has typical and atypical features. He had an abnormal ECG.  I will arrange an ETT-Myoview for risk stratification. 2. Snoring:  He has symptoms suggestive of sleep apnea. I have recommended weight loss.  I will schedule a sleep study. 3. Hypertension: Better control. Continue current therapy. Check a basic metabolic panel today. 4. Dyspnea:  Likely related to deconditioning.  Check BNP today.   5. Palpitations: He has symptoms suggestive of sleep apnea. This would put him at risk for atrial arrhythmias. Obtain 48 hour Holter monitor. 6. Axillary Adenopathy: He plans to establish with a PCP after his Medicaid starts. He will follow up with his new PCP for this problem. 7. 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 Follow up with PCP. 8. Glucose Intolerance: Follow up with PCP. 9. Disposition: Follow up with me in one month.  Signed, Tereso Newcomer, PA-C  10/16/2012 4:26 PM

## 2012-10-17 ENCOUNTER — Other Ambulatory Visit: Payer: Self-pay | Admitting: *Deleted

## 2012-10-17 DIAGNOSIS — E876 Hypokalemia: Secondary | ICD-10-CM

## 2012-10-17 LAB — BASIC METABOLIC PANEL
CO2: 27 mEq/L (ref 19–32)
Chloride: 106 mEq/L (ref 96–112)
Creatinine, Ser: 1.2 mg/dL (ref 0.4–1.5)
Potassium: 3.3 mEq/L — ABNORMAL LOW (ref 3.5–5.1)

## 2012-10-17 LAB — BRAIN NATRIURETIC PEPTIDE: Pro B Natriuretic peptide (BNP): 22 pg/mL (ref 0.0–100.0)

## 2012-10-17 MED ORDER — POTASSIUM CHLORIDE CRYS ER 20 MEQ PO TBCR: 20.0000 meq | EXTENDED_RELEASE_TABLET | Freq: Every day | ORAL | Status: DC

## 2012-10-22 ENCOUNTER — Other Ambulatory Visit: Payer: Self-pay

## 2012-10-22 ENCOUNTER — Ambulatory Visit (HOSPITAL_COMMUNITY): Payer: MEDICAID

## 2012-10-23 ENCOUNTER — Other Ambulatory Visit: Payer: Self-pay

## 2012-10-23 ENCOUNTER — Ambulatory Visit (HOSPITAL_COMMUNITY): Payer: Medicaid Other | Attending: Cardiovascular Disease | Admitting: Radiology

## 2012-10-23 ENCOUNTER — Telehealth (HOSPITAL_COMMUNITY): Payer: Self-pay

## 2012-10-23 ENCOUNTER — Telehealth: Payer: Self-pay

## 2012-10-23 VITALS — BP 115/81 | HR 59 | Ht 72.0 in | Wt 257.0 lb

## 2012-10-23 DIAGNOSIS — R5383 Other fatigue: Secondary | ICD-10-CM | POA: Insufficient documentation

## 2012-10-23 DIAGNOSIS — I10 Essential (primary) hypertension: Secondary | ICD-10-CM | POA: Insufficient documentation

## 2012-10-23 DIAGNOSIS — R0609 Other forms of dyspnea: Secondary | ICD-10-CM | POA: Insufficient documentation

## 2012-10-23 DIAGNOSIS — R42 Dizziness and giddiness: Secondary | ICD-10-CM | POA: Insufficient documentation

## 2012-10-23 DIAGNOSIS — R110 Nausea: Secondary | ICD-10-CM | POA: Insufficient documentation

## 2012-10-23 DIAGNOSIS — R0602 Shortness of breath: Secondary | ICD-10-CM

## 2012-10-23 DIAGNOSIS — R0989 Other specified symptoms and signs involving the circulatory and respiratory systems: Secondary | ICD-10-CM | POA: Insufficient documentation

## 2012-10-23 DIAGNOSIS — R002 Palpitations: Secondary | ICD-10-CM | POA: Insufficient documentation

## 2012-10-23 DIAGNOSIS — R61 Generalized hyperhidrosis: Secondary | ICD-10-CM | POA: Insufficient documentation

## 2012-10-23 DIAGNOSIS — R9431 Abnormal electrocardiogram [ECG] [EKG]: Secondary | ICD-10-CM | POA: Insufficient documentation

## 2012-10-23 DIAGNOSIS — R5381 Other malaise: Secondary | ICD-10-CM | POA: Insufficient documentation

## 2012-10-23 DIAGNOSIS — R0789 Other chest pain: Secondary | ICD-10-CM | POA: Insufficient documentation

## 2012-10-23 DIAGNOSIS — R079 Chest pain, unspecified: Secondary | ICD-10-CM

## 2012-10-23 DIAGNOSIS — R000 Tachycardia, unspecified: Secondary | ICD-10-CM | POA: Insufficient documentation

## 2012-10-23 DIAGNOSIS — Z8249 Family history of ischemic heart disease and other diseases of the circulatory system: Secondary | ICD-10-CM | POA: Insufficient documentation

## 2012-10-23 MED ORDER — TECHNETIUM TC 99M SESTAMIBI GENERIC - CARDIOLITE
33.0000 | Freq: Once | INTRAVENOUS | Status: AC | PRN
  Administered 2012-10-23: 33 via INTRAVENOUS

## 2012-10-23 MED ORDER — TECHNETIUM TC 99M SESTAMIBI GENERIC - CARDIOLITE
11.0000 | Freq: Once | INTRAVENOUS | Status: AC | PRN
  Administered 2012-10-23: 11 via INTRAVENOUS

## 2012-10-23 NOTE — Progress Notes (Signed)
[facility] SITE 3 NUCLEAR MED [address] [place], [place] [address] [phone]    Cardiology Nuclear Med Study  Darren Little is a 32 y.o. male     MRN : [id number]     DOB: 1980/05/26  Procedure Date: 10/23/2012  Nuclear Med Background Indication for Stress Test:  Evaluation for Ischemia History:  6/14 ED with chest pain, HTN urgency and abnormal EKG; 6/14 Echo:moderate LVH, EF=60% Cardiac Risk Factors: Family History - CAD and Hypertension  Symptoms:  Chest Pain/Pressure and Tightness with and without Exertion (last episode of chest discomfort was yesterday while on HCTZ-note sent to Dr. Jens Som re meds.), Diaphoresis, Dizziness, DOE, Fatigue, Nausea, Palpitations, Rapid HR and SOB   Nuclear Pre-Procedure Caffeine/Decaff Intake:  None> 12 hrs NPO After: 8:00pm   Lungs:  Clear. O2 Sat: 97% on room air. IV 0.9% NS with Angio Cath:  20g  IV Site: R Antecubital x 1, tolerated well IV Started by:  Irean Hong, RN  Chest Size (in):  46 Cup Size: n/a  Height: 6' (1.829 m)  Weight:  257 lb (116.574 kg)  BMI:  Body mass index is 34.85 kg/(m^2). Tech Comments:  Took Norvasc, and Lisinopril this am    Nuclear Med Study 1 or 2 day study: 1 day  Stress Test Type:  Stress  Reading MD: Charlton Haws, MD  Order Authorizing Provider:  Olga Millers, MD, and Tereso Newcomer, PA-C  Resting Radionuclide: Technetium 28m Sestamibi  Resting Radionuclide Dose: 11.0 mCi   Stress Radionuclide:  Technetium 15m Sestamibi  Stress Radionuclide Dose: 33.0 mCi           Stress Protocol Rest HR: 59 Stress HR: 158  Rest BP: 115/81 Stress BP: 188/74  Exercise Time (min): 8:38 METS: 10.3   Predicted Max HR: 186 bpm % Max HR: 84.95 bpm Rate Pressure Product: 21308   Dose of Adenosine (mg):  n/a Dose of Lexiscan: n/a mg  Dose of Atropine (mg): n/a Dose of Dobutamine: n/a mcg/kg/min (at max HR)  Stress Test Technologist: Smiley Houseman, CMA-N  Nuclear Technologist:  Domenic Polite,  CNMT     Rest Procedure:  Myocardial perfusion imaging was performed at rest 45 minutes following the intravenous administration of Technetium 58m Sestamibi.  Rest ECG: NSR-LVH  Stress Procedure:  The patient exercised on the treadmill utilizing the Bruce Protocol for 8:38 minutes. The patient stopped due to fatigue and denied any chest pain.  Technetium 17m Sestamibi was injected at peak exercise and myocardial perfusion imaging was performed after a brief delay.  Stress ECG: No significant change from baseline ECG  QPS Raw Data Images:  Normal; no motion artifact; normal heart/lung ratio. Stress Images:  Normal homogeneous uptake in all areas of the myocardium. Rest Images:  Normal homogeneous uptake in all areas of the myocardium. Subtraction (SDS):  Normal Transient Ischemic Dilatation (Normal <1.22):  n/a Lung/Heart Ratio (Normal <0.45):  0.33  Quantitative Gated Spect Images QGS EDV:  152 ml QGS ESV:  69 ml  Impression Exercise Capacity:  Fair exercise capacity. BP Response:  Normal blood pressure response. Clinical Symptoms:  There is dyspnea. ECG Impression:  No significant ST segment change suggestive of ischemia. Comparison with Prior Nuclear Study: No previous nuclear study performed  Overall Impression:  Normal stress nuclear study.  LV Ejection Fraction: 54%.  LV Wall Motion:  NL LV Function; NL Wall Motion   Charlton Haws

## 2012-10-23 NOTE — Telephone Encounter (Signed)
Would continue HCTZ (typically not associated with CP) Darren Little

## 2012-10-23 NOTE — Telephone Encounter (Signed)
Received call from Irean Hong RN in our Nuclear Dept. She stated patient is there having myoview and when talking to patient he told her after he takes HCTZ he has chest tightness and feels drained.Patient was told to hold HCTZ for [facility] and feels better today.Message sent to [facility] for advice.

## 2012-10-23 NOTE — Telephone Encounter (Signed)
T.C. with Anabel Halon regarding Darren Little (DOB: [birth year])is here for a Stress Cardiolite. He is complaining that when he takes the HCTZ he gets chest tightness, and fatigue. He had recent lab work and started on potassium.  The patient held the HCTZ today for test, and he feels better. Anabel Halon to follow-up and call the patient back.Irean Hong, RN.

## 2012-10-24 ENCOUNTER — Telehealth: Payer: Self-pay

## 2012-10-24 ENCOUNTER — Encounter: Payer: Self-pay | Admitting: Physician Assistant

## 2012-10-24 NOTE — Telephone Encounter (Signed)
See 10/24/12 telephone note.

## 2012-10-24 NOTE — Telephone Encounter (Signed)
Patient called no answer.LMTC. 

## 2012-10-24 NOTE — Telephone Encounter (Signed)
Patient called was told Dr.Crenshaw advised HCTZ typically did not cause chest pain.Advised to continue.Patient stated he will restart tomorrow.Advised to call back if starts having chest pain again.

## 2012-10-27 ENCOUNTER — Telehealth: Payer: Self-pay | Admitting: *Deleted

## 2012-10-27 NOTE — Telephone Encounter (Signed)
lmom normal myoview 

## 2012-10-27 NOTE — Telephone Encounter (Signed)
Message copied by Tarri Fuller on Mon Oct 27, 2012 11:07 AM ------      Message from: [place], [place] T      Created: Fri Oct 24, 2012  1:33 PM       Please inform patient stress test normal.      Tereso Newcomer, PA-C  1:32 PM 10/24/2012 ------

## 2012-10-29 ENCOUNTER — Telehealth: Payer: Self-pay | Admitting: Cardiology

## 2012-10-29 NOTE — Telephone Encounter (Signed)
Also discuss the paper work I gave to patient for [employer] for holter monitor.  He stated that he has not completed the application for the monitor and the orange card.  He will try to turn the information in on Friday.

## 2012-10-29 NOTE — Telephone Encounter (Signed)
Having a problems with the hydrochlorothiazide med.  When I take the med it cause me to have chest pain.

## 2012-10-30 ENCOUNTER — Other Ambulatory Visit (INDEPENDENT_AMBULATORY_CARE_PROVIDER_SITE_OTHER): Payer: Self-pay

## 2012-10-30 DIAGNOSIS — E876 Hypokalemia: Secondary | ICD-10-CM

## 2012-10-30 LAB — BASIC METABOLIC PANEL
CO2: 28 mEq/L (ref 19–32)
Calcium: 9 mg/dL (ref 8.4–10.5)
Chloride: 106 mEq/L (ref 96–112)
Glucose, Bld: 95 mg/dL (ref 70–99)
Sodium: 139 mEq/L (ref 135–145)

## 2012-10-30 NOTE — Telephone Encounter (Signed)
LMTCB

## 2012-10-31 ENCOUNTER — Telehealth: Payer: Self-pay | Admitting: *Deleted

## 2012-10-31 NOTE — Telephone Encounter (Signed)
pt notified about lab results with verbal understanding  

## 2012-10-31 NOTE — Telephone Encounter (Signed)
Message copied by Tarri Fuller on Fri Oct 31, 2012  5:33 PM ------      Message from: [place], [place] T      Created: Thu Oct 30, 2012  5:23 PM       Potassium and creatinine okay      Continue with current treatment plan.      Tereso Newcomer, PA-C        10/30/2012 5:23 PM ------

## 2012-11-06 ENCOUNTER — Ambulatory Visit: Payer: Self-pay

## 2012-11-07 ENCOUNTER — Ambulatory Visit: Payer: Self-pay | Attending: Family Medicine | Admitting: Internal Medicine

## 2012-11-07 VITALS — BP 160/107 | HR 74 | Temp 98.6°F | Resp 16 | Ht 71.0 in | Wt 267.0 lb

## 2012-11-07 DIAGNOSIS — R7309 Other abnormal glucose: Secondary | ICD-10-CM | POA: Insufficient documentation

## 2012-11-07 DIAGNOSIS — R7303 Prediabetes: Secondary | ICD-10-CM

## 2012-11-07 DIAGNOSIS — I10 Essential (primary) hypertension: Secondary | ICD-10-CM

## 2012-11-07 DIAGNOSIS — R9389 Abnormal findings on diagnostic imaging of other specified body structures: Secondary | ICD-10-CM

## 2012-11-07 DIAGNOSIS — D691 Qualitative platelet defects: Secondary | ICD-10-CM

## 2012-11-07 DIAGNOSIS — R718 Other abnormality of red blood cells: Secondary | ICD-10-CM

## 2012-11-07 HISTORY — DX: Essential (primary) hypertension: I10

## 2012-11-07 NOTE — Progress Notes (Signed)
Patient ID: Darren Little, male   DOB: Jan 21, 1981, 32 y.o.   MRN: [id number] Patient Demographics  Darren Little, is a 33 y.o. male  WUJ:[id number]  [id number]  DOB - Oct 29, 1980  Chief Complaint  Patient presents with  . Establish Care  . Hypertension        Subjective:   [name] today is here to establish primary care. Patient was in the hospital in June 2014 with hypertensive emergency. Patient was referred to the clinic for followup in establishing primary care. He was started on Norvasc, lisinopril, HCTZ. Initially he stated that hydrochlorothiazide was making him urinate a lot and occasionally causing chest tightness and shoulder pain. But then he stated that hydrochlorothiazide also improved his pedal edema significantly. In the clinic the BP is 160/107 however he states that it has been running better at home.  Patient has No headache, No chest pain, No abdominal pain - No Nausea, No new weakness tingling or numbness, No Cough - SOB.   Objective:    Filed Vitals:   11/07/12 1645  BP: 160/107  Pulse: 74  Temp: 98.6 F (37 C)  TempSrc: Oral  Resp: 16  Height: 5\' 11"  (1.803 m)  Weight: 267 lb (121.11 kg)  SpO2: 98%     ALLERGIES:   Allergies  Allergen Reactions  . Bee Venom Swelling  . Other Other (See Comments)    Squash- hives    PAST MEDICAL HISTORY: Past Medical History  Diagnosis Date  . Hypertension   . Prediabetes     A1C 6.1% 08/2012  . Microcytosis     MCV 65  . Abnormal CT scan 08/2012    R axilla adenopathy  . LVH (left ventricular hypertrophy)     08/2712 echo-EF 55-60%, moderate LVH and mild LA dilatation.   . Abnormal platelets 09/26/12    Large platelets  . Abnormal RBC 09/26/12    Elliptocytes  . Hx of cardiovascular stress test     ETT-Myoview 7/14:  Normal, EF 54%, no ischemia    PAST SURGICAL HISTORY: Past Surgical History  Procedure Laterality Date  . Hand surgery      Right hand, 5years agi    FAMILY  HISTORY: Family History  Problem Relation Age of Onset  . Heart disease Mother     MEDICATIONS AT HOME: Prior to Admission medications   Medication Sig Start Date End Date Taking? Authorizing Provider  amLODipine (NORVASC) 5 MG tablet Take 1 tablet (5 mg total) by mouth daily. 09/28/12  Yes Roger A Arguello, PA-C  hydrochlorothiazide (HYDRODIURIL) 25 MG tablet Take 1 tablet (25 mg total) by mouth daily. 09/28/12  Yes Roger A Arguello, PA-C  potassium chloride SA (K-DUR,KLOR-CON) 20 MEQ tablet Take 1 tablet (20 mEq total) by mouth daily. 10/17/12  Yes Scott Moishe Spice, PA-C  lisinopril (PRINIVIL,ZESTRIL) 20 MG tablet Take 1 tablet (20 mg total) by mouth daily. 09/28/12   Roger A Arguello, PA-C    REVIEW OF SYSTEMS:  Constitutional:   No   Fevers, chills, fatigue.  HEENT:    No headaches, Sore throat,   Cardio-vascular: No chest pain,  Orthopnea, swelling in lower extremities, anasarca, palpitations  GI:  No abdominal pain, nausea, vomiting, diarrhea  Resp: No shortness of breath,  No coughing up of blood.No cough.No wheezing.  Skin:  no rash or lesions.  GU:  no dysuria, change in color of urine, no urgency or frequency.  No flank pain.  Musculoskeletal: No joint pain or swelling.  No  decreased range of motion.  No back pain.  Psych: No change in mood or affect. No depression or anxiety.  No memory loss.   Exam  General appearance :Awake, alert, NAD, Speech Clear. HEENT: Atraumatic and Normocephalic, PERLA Neck: supple, no JVD. No cervical lymphadenopathy.  Chest: clear to auscultation bilaterally, no wheezing, rales or rhonchi CVS: S1 S2 regular, no murmurs.  Abdomen: soft, NBS, NT, ND, no gaurding, rigidity or rebound. Extremities: No cyanosis, clubbing, B/L Lower Ext shows no edema,  Neurology: Awake alert, and oriented X 3, CN II-XII intact, Non focal Skin:No Rash or lesions Wounds: N/A    Data Review   Basic Metabolic Panel: No results found for this  basename: NA, K, CL, CO2, GLUCOSE, BUN, CREATININE, CALCIUM, MG, PHOS,  in the last 168 hours Liver Function Tests: No results found for this basename: AST, ALT, ALKPHOS, BILITOT, PROT, ALBUMIN,  in the last 168 hours  CBC: No results found for this basename: WBC, NEUTROABS, HGB, HCT, MCV, PLT,  in the last 168 hours ------------------------------------------------------------------------------------------------------------------ No results found for this basename: HGBA1C,  in the last 72 hours ------------------------------------------------------------------------------------------------------------------ No results found for this basename: CHOL, HDL, LDLCALC, TRIG, CHOLHDL, LDLDIRECT,  in the last 72 hours ------------------------------------------------------------------------------------------------------------------ No results found for this basename: TSH, T4TOTAL, FREET3, T3FREE, THYROIDAB,  in the last 72 hours ------------------------------------------------------------------------------------------------------------------ No results found for this basename: VITAMINB12, FOLATE, FERRITIN, TIBC, IRON, RETICCTPCT,  in the last 72 hours  Coagulation profile  No results found for this basename: INR, PROTIME,  in the last 168 hours    Assessment & Plan   Active Problems: Uncontrolled hypertension: - Patient has upcoming appointment with [facility] cardiology on 11/18/2012, he had an extensive workup done during hospitalization. - TSH normal, CT chest ruled out any PE. 2-D echo showed moderate LVH with EF of 55-60%, mild LA dilatation - Patient had stress test done on 10/24/2012 which was negative as well. - I recommended him to continue the same regimen as recommended by [facility] cardiology for now and we'll follow the recommendations. - Continue low-salt and low carb diet., Exercise   Axillary lymphadenopathy on CT angiogram of the chest, microcytosis MCV 65.4, elliptocytes - On  examination, patient did not have any lymphadenopathy on palpation - Ambulatory referral to hematology sent  Borderline diabetes: - hemoglobin A1c 6.1, recommended patient to continue carb modified diet, exercise - Will follow closely, may need to be started on oral hypoglycemics, will reassess next visit.  Recommendations: Will check lipid panel at the next visit  Follow-up in 4 weeks   Anjuli Gemmill M.D. 11/07/2012, 5:05 PM

## 2012-11-07 NOTE — Progress Notes (Signed)
Patient presents to establish care; states was in hospital in June and put on anti-hypertensive medication.

## 2012-11-07 NOTE — Patient Instructions (Signed)
Hypertension As your heart beats, it forces blood through your arteries. This force is your blood pressure. If the pressure is too high, it is called hypertension (HTN) or high blood pressure. HTN is dangerous because you may have it and not know it. High blood pressure may mean that your heart has to work harder to pump blood. Your arteries may be narrow or stiff. The extra work puts you at risk for heart disease, stroke, and other problems.  Blood pressure consists of two numbers, a higher number over a lower, 110/72, for example. It is stated as "110 over 72." The ideal is below 120 for the top number (systolic) and under 80 for the bottom (diastolic). Write down your blood pressure today. You should pay close attention to your blood pressure if you have certain conditions such as:  Heart failure.  Prior heart attack.  Diabetes  Chronic kidney disease.  Prior stroke.  Multiple risk factors for heart disease. To see if you have HTN, your blood pressure should be measured while you are seated with your arm held at the level of the heart. It should be measured at least twice. A one-time elevated blood pressure reading (especially in the Emergency Department) does not mean that you need treatment. There may be conditions in which the blood pressure is different between your right and left arms. It is important to see your caregiver soon for a recheck. Most people have essential hypertension which means that there is not a specific cause. This type of high blood pressure may be lowered by changing lifestyle factors such as:  Stress.  Smoking.  Lack of exercise.  Excessive weight.  Drug/tobacco/alcohol use.  Eating less salt. Most people do not have symptoms from high blood pressure until it has caused damage to the body. Effective treatment can often prevent, delay or reduce that damage. TREATMENT  When a cause has been identified, treatment for high blood pressure is directed at the  cause. There are a large number of medications to treat HTN. These fall into several categories, and your caregiver will help you select the medicines that are best for you. Medications may have side effects. You should review side effects with your caregiver. If your blood pressure stays high after you have made lifestyle changes or started on medicines,   Your medication(s) may need to be changed.  Other problems may need to be addressed.  Be certain you understand your prescriptions, and know how and when to take your medicine.  Be sure to follow up with your caregiver within the time frame advised (usually within two weeks) to have your blood pressure rechecked and to review your medications.  If you are taking more than one medicine to lower your blood pressure, make sure you know how and at what times they should be taken. Taking two medicines at the same time can result in blood pressure that is too low. SEEK IMMEDIATE MEDICAL CARE IF:  You develop a severe headache, blurred or changing vision, or confusion.  You have unusual weakness or numbness, or a faint feeling.  You have severe chest or abdominal pain, vomiting, or breathing problems. MAKE SURE YOU:   Understand these instructions.  Will watch your condition.  Will get help right away if you are not doing well or get worse. Document Released: 03/19/2005 Document Revised: 06/11/2011 Document Reviewed: 11/07/2007 Exit[employer] Patient Information 2014 [employer].  Diabetes, Type 2, Am I At Risk? Diabetes is a lasting (chronic) disease. In type   2 diabetes, the pancreas does not make enough insulin, and the body does not respond normally to the insulin that is made. This type of diabetes was also previously called adult onset diabetes. About 90% of all those who have diabetes have type 2. It usually occurs after the age of 40, but can occur at any age.  People develop type 2 diabetes because they do not use insulin properly.  Eventually, the pancreas cannot make enough insulin for the body's needs. Over time, the amount of glucose (sugar) in the blood increases. RISK FACTORS  Overweight  the more weight you have, the more resistant your cells become to insulin.  Family history  you are more likely to get diabetes if a parent or sibling has diabetes.  Race certain races get diabetes more.  African Americans.  American Indians.  Asian Americans.  Hispanics.  Pacific Islander.  Inactive exercise helps control weight and helps your cells be more sensitive to insulin.  Gestational diabetes  some women develop diabetes while they are pregnant. This goes away when they deliver. However, they are 50-60% more likely to develop type 2 diabetes at a later time.  Having a baby over 9 pounds  a sign that you may have had gestational diabetes.  Age the risk of diabetes goes up as you get older, especially after age 45.  High blood pressure (hypertension). SYMPTOMS Many people have no signs or symptoms. Symptoms can be so mild that you might not even notice them. Some of these signs are:  Increased thirst.  Increased hunger.  Tiredness (fatigue).  Increased urination, especially at night.  Weight loss.  Blurred vision.  Sores that do not heal. WHO SHOULD BE TESTED?  Anyone 45 years or older, especially if overweight, should consider getting tested.  If you are younger than 45, overweight, and have one or more of the risk factors, you should consider getting tested. DIAGNOSIS  Fasting blood glucose (FBS). Usually, 2 are done.  FBS 101-125 mg/dl is considered pre-diabetes.  FBS 126 mg/dl or greater is considered diabetes.  2 hour Oral Glucose Tolerance Test (OGTT). This test is preformed by first having you not eat or drink for several hours. You are then given something sweet to drink and your blood glucose is measured fasting, at one hour and 2 hours. This test tells how well you are able to  handle sugars or carbohydrates.  Fasting: 60-100 mg/dl.  1 hour: less than 200 mg/dl.  2 hours: less than 140 mg/dl.  A1c A1c is a blood glucose test that gives and average of your blood glucose over 3 months. It is the accepted method to use to diagnose diabetes.  A1c 5.7-6.4% is considered pre-diabetes.  A1c 6.5% or greater is considered diabetes. WHAT DOES IT MEAN TO HAVE PRE-DIABETES? Pre-diabetes means you are at risk for getting type 2 diabetes. Your blood glucose is higher than normal, but not yet high enough to diagnose diabetes. The good news is, if you have pre-diabetes you can reduce the risk of getting diabetes and even return to normal blood glucose levels. With modest weight loss and moderate physical activity, you can delay or prevent type 2 diabetes.  PREVENTION You cannot do anything about race, age or family history, but you can lower your chances of getting diabetes. You can:   Exercise regularly and be active.  Reduce fat and calorie intake.  Make wise food choices as much as you can.  Reduce your intake of salt and alcohol.    Maintain a reasonable weight.  Keep blood pressure in an acceptable range. Take medication if needed.  Not smoke.  Maintain an acceptable cholesterol level (HDL, LDL, Triglycerides). Take medication if needed. DOING MY PART: GETTING STARTED Making big changes in your life is hard, especially if you are faced with more than one change. You can make it easier by taking these steps:  Make a plan to change behavior.  Decide exactly what you will do and when you will do it.  Plan what you need to get ready.  Think about what might prevent you from reaching your goals.  Find family and friends who will support and encourage you.  Decide how you will reward yourself when you do what you have planned.  Your doctor, dietitian, or counselor can help you make a plan. HERE ARE SOME OF THE AREAS YOU MAY WISH TO CHANGE TO REDUCE YOUR RISK  OF DIABETES. If you are overweight or obese, choose sensible ways to get in shape. Even small amounts of weight loss, like 5-10 pounds, can help reduce the effects of insulin resistance and help blood glucose control. Diet  Avoid crash diets. Instead, eat less of the foods you usually have. Limit the amount of fat you eat.  Increase your physical activity. Aim for at least 30 minutes of exercise most days of the week.  Set a reasonable weight-loss goal, such as losing 1 pound a week. Aim for a long-term goal of losing 5-7% of your total body weight.  Make wise food choices most of the time.  What you eat has a big impact on your health. By making wise food choices, you can help control your body weight, blood pressure, and cholesterol.  Take a hard look at the serving sizes of the foods you eat. Reduce serving sizes of meat, desserts, and foods high in fat. Increase your intake of fruits and vegetables.  Limit your fat intake to about 25% of your total calories. For example, if your food choices add up to about 2,000 calories a day, try to eat no more than 56 grams of fat. Your caregiver or a dietitian can help you figure out how much fat to have. You can check food labels for fat content too.  You may also want to reduce the number of calories you have each day.  Keep a food log. Write down what you eat, how much you eat, and anything else that helps keep you on track.  When you meet your goal, reward yourself with a nonfood item or activity. Exercise  Be physically active every day.  Keep and exercise log. Write down what exercise you did, for how long, and anything else that keeps you on track.  Regular exercise (like brisk walking) tackles several risk factors at once. It helps you lose weight, it keeps your cholesterol and blood pressure under control, and it helps your body use insulin. People who are physically active for 30 minutes a day, 5 days a week, reduced their risk of type  2 diabetes. If you are not very active, you should start slowly at first. Talk with your caregiver first about what kinds of exercise would be safe for you. Make a plan to increase your activity level with the goal of being active for at least 30 minutes a day, most days of the week.  Choose activities you enjoy. Here are some ways to work extra activity into your daily routine:  Take the stairs rather than an elevator   or escalator.  Park at the far end of the lot and walk.  Get off the bus a few stops early and walk the rest of the way.  Walk or bicycle instead of drive whenever you can. Medications Some people need medication to help control their blood pressure or cholesterol levels. If you do, take your medicines as directed. Ask your caregiver whether there are any medicines you can take to prevent type 2 diabetes. Document Released: 03/22/2003 Document Revised: 06/11/2011 Document Reviewed: 12/15/2008 Exit[employer] Patient Information 2014 [employer].  

## 2012-11-11 ENCOUNTER — Telehealth: Payer: Self-pay | Admitting: Hematology and Oncology

## 2012-11-11 NOTE — Telephone Encounter (Signed)
S/W PT WIFE IN RE TO NP APPT 08/29 @ 3 W/DR. [facility] PACKET MAILED.

## 2012-11-13 NOTE — Telephone Encounter (Signed)
Left message for pt to call if he is still having problems 

## 2012-11-17 ENCOUNTER — Telehealth: Payer: Self-pay

## 2012-11-17 NOTE — Telephone Encounter (Signed)
C/D 11/17/12 for appt. 11/28/12

## 2012-11-18 ENCOUNTER — Ambulatory Visit (INDEPENDENT_AMBULATORY_CARE_PROVIDER_SITE_OTHER): Payer: Self-pay | Admitting: Physician Assistant

## 2012-11-18 ENCOUNTER — Encounter: Payer: Self-pay | Admitting: Physician Assistant

## 2012-11-18 VITALS — BP 130/80 | HR 81 | Ht 71.0 in | Wt 258.0 lb

## 2012-11-18 DIAGNOSIS — R002 Palpitations: Secondary | ICD-10-CM

## 2012-11-18 DIAGNOSIS — R0683 Snoring: Secondary | ICD-10-CM

## 2012-11-18 DIAGNOSIS — I10 Essential (primary) hypertension: Secondary | ICD-10-CM

## 2012-11-18 DIAGNOSIS — R0609 Other forms of dyspnea: Secondary | ICD-10-CM

## 2012-11-18 NOTE — Progress Notes (Signed)
[address] [place], [place]  [address] Phone: [phone] Fax:  [phone]  Date:  11/18/2012   ID:  PRAKASH KIMBERLING, DOB [birth year], MRN [id number]  PCP:  No primary provider on file.  Cardiologist:  Dr. Olga Millers     History of Present Illness: Darren Little is a 32 y.o. male who returns for follow up.  He was admitted to the hospital with hypertensive urgency 08/2012. He has a hx of hypertension that is largely untreated. He presented to the hospital with chest pain, blurred vision, headaches. ECG was abnormal with probable LVH with strain.  Chest CT was negative for pulmonary embolism. He did have evidence of right axillary adenopathy of unknown significance and asked to f/u with PCP. He was placed on multiple antihypertensives with better controlled blood pressures. Echocardiogram 09/26/12: Moderate LVH, EF 55-60%, normal diastolic function, mild LAE. Lab work did demonstrate significant microcytosis with normal hemoglobin and he was asked to f/u with PCP.  He had some left calf pain and left LE [place] was negative for DVT.    I saw him in follow up 10/16/12. He continued to have some episodes of chest discomfort. ETT Myoview was arranged and demonstrated no ischemia.  Sleep study is pending for evaluation of symptoms of snoring.  Since last seen, he has been trying to lose weight and is exercising.  He feels much better.  BP is much better.  He denies any further CP.  No significant dyspnea.  No syncope, orthopnea, PND.  No edema.    Labs (6/14):  K 4, Cr 1.23, ALT 15, Hgb 13.6, MCV 65.4, Plt 217K, A1c, TSH 1.162 Labs (7/14):  K 3.3 => 3.7, Cr 1.1, proBNP 22  Wt Readings from Last 3 Encounters:  11/18/12 258 lb (117.028 kg)  11/07/12 267 lb (121.11 kg)  10/23/12 257 lb (116.574 kg)     Past Medical History  Diagnosis Date  . Hypertension   . Prediabetes     A1C 6.1% 08/2012  . Microcytosis     MCV 65  . Abnormal CT scan 08/2012    R axilla adenopathy   . LVH (left ventricular hypertrophy)     08/2712 echo-EF 55-60%, moderate LVH and mild LA dilatation.   . Abnormal platelets 09/26/12    Large platelets  . Abnormal RBC 09/26/12    Elliptocytes  . Hx of cardiovascular stress test     ETT-Myoview 7/14:  Normal, EF 54%, no ischemia    Current Outpatient Prescriptions  Medication Sig Dispense Refill  . amLODipine (NORVASC) 5 MG tablet Take 1 tablet (5 mg total) by mouth daily.  30 tablet  3  . hydrochlorothiazide (HYDRODIURIL) 25 MG tablet Take 1 tablet (25 mg total) by mouth daily.  30 tablet  3  . lisinopril (PRINIVIL,ZESTRIL) 20 MG tablet Take 1 tablet (20 mg total) by mouth daily.  30 tablet  3  . potassium chloride SA (K-DUR,KLOR-CON) 20 MEQ tablet Take 1 tablet (20 mEq total) by mouth daily.  30 tablet  6   No current facility-administered medications for this visit.    Allergies:    Allergies  Allergen Reactions  . Bee Venom Swelling  . Other Other (See Comments)    Squash- hives    Social History:  The patient  reports that he has never smoked. He does not have any smokeless tobacco history on file. He reports that he does not drink alcohol or use illicit drugs.   ROS:  Please see the history of present illness.    All other systems reviewed and negative.   PHYSICAL EXAM: VS:  BP 130/80  Pulse 81  Ht 5\' 11"  (1.803 m)  Wt 258 lb (117.028 kg)  BMI 36 kg/m2 Well nourished, well developed, in no acute distress HEENT: normal Neck: no JVD Cardiac:  normal S1, S2; RRR; no murmur Lungs:  clear to auscultation bilaterally, no wheezing, rhonchi or rales Abd: soft, nontender, no hepatomegaly Ext: no edema Skin: warm and dry Neuro:  CNs 2-12 intact, no focal abnormalities noted  EKG:  NSR, HR 81, normal axis, LVH with repolarization abnormality     ASSESSMENT AND PLAN:  1. Chest Pain:  Resolved.  Myoview low risk.  No further workup. 2. Snoring:  Sleep study pending.  3. Hypertension: Better controlled. Continue current  therapy.   4. Palpitations:  Resolved.  Will hold off on getting Holter for now.  5. Microcytosis:  He has an appt with hematology pending. 6. Disposition: Follow up with me in 6 mos.  Signed, Tereso Newcomer, PA-C  11/18/2012 3:49 PM

## 2012-11-18 NOTE — Patient Instructions (Addendum)
Your physician wants you to follow-up in: 6 MONTH WITH SCOTT WEAVER, PAC. You will receive a reminder letter in the mail two months in advance. If you don't receive a letter, please call our office to schedule the follow-up appointment.   NO CHANGES WERE MADE TODAY

## 2012-11-25 ENCOUNTER — Ambulatory Visit (HOSPITAL_BASED_OUTPATIENT_CLINIC_OR_DEPARTMENT_OTHER): Payer: Self-pay | Attending: Physician Assistant | Admitting: Radiology

## 2012-11-25 VITALS — Ht 72.0 in | Wt 258.0 lb

## 2012-11-25 DIAGNOSIS — G4733 Obstructive sleep apnea (adult) (pediatric): Secondary | ICD-10-CM | POA: Insufficient documentation

## 2012-11-25 DIAGNOSIS — R0683 Snoring: Secondary | ICD-10-CM

## 2012-11-28 ENCOUNTER — Ambulatory Visit: Payer: Self-pay

## 2012-11-28 ENCOUNTER — Other Ambulatory Visit: Payer: Self-pay

## 2012-11-28 ENCOUNTER — Ambulatory Visit (HOSPITAL_BASED_OUTPATIENT_CLINIC_OR_DEPARTMENT_OTHER): Payer: Self-pay | Admitting: Hematology and Oncology

## 2012-11-28 ENCOUNTER — Encounter: Payer: Self-pay | Admitting: Hematology and Oncology

## 2012-11-28 VITALS — BP 159/94 | HR 83 | Temp 97.9°F | Resp 18 | Ht 72.0 in | Wt 259.7 lb

## 2012-11-28 DIAGNOSIS — R590 Localized enlarged lymph nodes: Secondary | ICD-10-CM

## 2012-11-28 DIAGNOSIS — R718 Other abnormality of red blood cells: Secondary | ICD-10-CM

## 2012-11-28 DIAGNOSIS — R5381 Other malaise: Secondary | ICD-10-CM

## 2012-11-28 DIAGNOSIS — R599 Enlarged lymph nodes, unspecified: Secondary | ICD-10-CM

## 2012-11-28 NOTE — Progress Notes (Signed)
Patient will call and check on medicaid next week. I advised this will be self pay. No other financial concerns. Didn't ask if living will/poa. He doesn't have a pcp--he said he has seen several drs.

## 2012-12-02 ENCOUNTER — Telehealth: Payer: Self-pay | Admitting: Hematology and Oncology

## 2012-12-02 ENCOUNTER — Other Ambulatory Visit: Payer: Self-pay | Admitting: *Deleted

## 2012-12-02 DIAGNOSIS — R718 Other abnormality of red blood cells: Secondary | ICD-10-CM

## 2012-12-02 NOTE — Progress Notes (Signed)
[facility]  Telephone:([phone]) [phone] Fax:(336) [phone]     INITIAL HEMATOLOGY CONSULTATION    Referral MD:    Reason for Referral:  Microcytosis. Right axillary adenopathy on CT scan on 09/25/2012   HISTORY OF PRESENT ILLNESS: The patient is a 32 y.o.man who was send for hematology consult because of "small RBC' He also had CT angiography of chest which reported enlarge right axillary lymphonodulus. Overall he is doing well but recently he started to feel fatigue. He also complains on mild night sweats which started two  weeks ago. His appetite is good. He reported that he has blood from gum when he brushed his teeth. The patient denied fever, chills, change in weight. He denied headaches, double vision, blurry vision, nasal congestion, nasal discharge, hearing problems, odynophagia or dysphagia. No chest pain, palpitations, dyspnea, cough, abdominal pain, nausea, vomiting, diarrhea, constipation, hematochezia. The patient denied dysuria, nocturia, polyuria, hematuria, myalgia, numbness, tingling, psychiatric problems.  Review of Systems  Constitutional: Positive for malaise/fatigue. Negative for fever, chills, weight loss and diaphoresis.  HENT: Negative for hearing loss, ear pain, nosebleeds, congestion, sore throat, neck pain and tinnitus.   Eyes: Negative for blurred vision, double vision, photophobia and pain.  Respiratory: Negative for cough, hemoptysis, sputum production, shortness of breath, wheezing and stridor.   Cardiovascular: Negative for chest pain, palpitations, orthopnea, claudication, leg swelling and PND.  Gastrointestinal: Negative for heartburn, nausea, vomiting, abdominal pain, diarrhea, constipation, blood in stool and melena.  Genitourinary: Negative for dysuria, urgency, frequency and hematuria.  Musculoskeletal: Negative for myalgias, back pain, joint pain and falls.  Skin: Negative for itching and rash.  Neurological: Negative for  dizziness, tingling, tremors, sensory change, speech change, focal weakness, seizures, loss of consciousness, weakness and headaches.  Endo/Heme/Allergies: Negative for environmental allergies and polydipsia. Does not bruise/bleed easily.  Psychiatric/Behavioral: Negative.    Past Medical History  Diagnosis Date  . Hypertension   . Prediabetes     A1C 6.1% 08/2012  . Microcytosis     MCV 65  . Abnormal CT scan 08/2012    R axilla adenopathy  . LVH (left ventricular hypertrophy)     08/2712 echo-EF 55-60%, moderate LVH and mild LA dilatation.   . Abnormal platelets 09/26/12    Large platelets  . Abnormal RBC 09/26/12    Elliptocytes  . Hx of cardiovascular stress test     ETT-Myoview 7/14:  Normal, EF 54%, no ischemia  :   Past Surgical History  Procedure Laterality Date  . Hand surgery      Right hand, 5years agi  :  CURRENT MEDS: Current Outpatient Prescriptions  Medication Sig Dispense Refill  . amLODipine (NORVASC) 5 MG tablet Take 1 tablet (5 mg total) by mouth daily.  30 tablet  3  . hydrochlorothiazide (HYDRODIURIL) 25 MG tablet Take 1 tablet (25 mg total) by mouth daily.  30 tablet  3  . lisinopril (PRINIVIL,ZESTRIL) 20 MG tablet Take 1 tablet (20 mg total) by mouth daily.  30 tablet  3  . potassium chloride SA (K-DUR,KLOR-CON) 20 MEQ tablet Take 1 tablet (20 mEq total) by mouth daily.  30 tablet  6   No current facility-administered medications for this visit.     Allergies  Allergen Reactions  . Bee Venom Swelling  . Other Other (See Comments)    Squash- hives  :  Family History  Problem Relation Age of Onset  . Heart disease Mother   :  History   Social History  .  Marital Status: Single    Spouse Name: N/A    Number of Children: N/A  . Years of Education: N/A   Occupational History  . Not on file.   Social History Main Topics  . Smoking status: Never Smoker   . Smokeless tobacco: Not on file  . Alcohol Use: No  . Drug Use: No  . Sexual  Activity: Not on file   Other Topics Concern  . Not on file   Social History Narrative  . No narrative on file  :  OBJECTIVE: Filed Vitals:   11/28/12 1540  BP: 159/94  Pulse: 83  Temp: 97.9 F (36.6 C)  Resp: 18     Body mass index is 35.21 kg/(m^2).    ECOG FS:0  PHYSICAL EXAMINATION:  General:  well-nourished in no acute distress.  Eyes:  no scleral icterus.  ENT:  There were no oropharyngeal lesions.  Neck was without thyromegaly.  Lymphatics:  Negative cervical, supraclavicular or axillary adenopathy.  Respiratory: lungs were clear bilaterally without wheezing or crackles.  Cardiovascular:  Regular rate and rhythm, S1/S2, without murmur, rub or gallop.  There was no pedal edema.  GI:  abdomen was soft, flat, nontender, nondistended, without organomegaly.  Musculoskeletal:  no spinal tenderness of palpation of vertebral spine.  Skin exam was without ecchymosis, petechiae.  Neuro exam was non focal.  Patient was able to get on and off exam table without assistance.  Gait was normal.  Patient was alerted and oriented.  Attention was good.   Language was appropriate.  Mood was normal without depression.  Speech was not pressured.  Thought content was not tangential.    LABS:  Lab Results  Component Value Date   WBC 4.6 09/28/2012   HGB 13.6 09/28/2012   HCT 41.2 09/28/2012   PLT 217 09/28/2012   GLUCOSE 95 10/30/2012   ALT 15 09/26/2012   AST 22 09/26/2012   NA 139 10/30/2012   K 3.7 10/30/2012   CL 106 10/30/2012   CREATININE 1.1 10/30/2012   BUN 16 10/30/2012   CO2 28 10/30/2012   HGBA1C 6.1* 09/26/2012      ASSESSMENT AND PLAN:  1. Microcytosis. 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 2. Right axillary lymph node. We discussed with patient excisional biopsy of this lymph node. Currently patient is asymptomatic and he wants to be observe. U/S of right axillary area will be  order and we will follow patient regularly. 3. Follow up in two weeks.   Thank you for this referral.   Myra Rude, MD   11/28/2012 8:12 PM

## 2012-12-03 ENCOUNTER — Other Ambulatory Visit: Payer: Self-pay | Admitting: Lab

## 2012-12-03 ENCOUNTER — Other Ambulatory Visit: Payer: Self-pay | Admitting: *Deleted

## 2012-12-03 DIAGNOSIS — G4710 Hypersomnia, unspecified: Secondary | ICD-10-CM

## 2012-12-03 DIAGNOSIS — R718 Other abnormality of red blood cells: Secondary | ICD-10-CM

## 2012-12-03 DIAGNOSIS — G4730 Sleep apnea, unspecified: Secondary | ICD-10-CM

## 2012-12-03 NOTE — Procedures (Signed)
NAME:  Darren, Little NO.:  [id number]  MEDICAL RECORD NO.:  [id number]          PATIENT TYPE:  OUT  LOCATION:  SLEEP CENTER                 FACILITY[facility]  [facility]  PHYSICIAN:  Barbaraann Share, MD,FCCPDATE OF BIRTH:  10/10/78  DATE OF STUDY:  11/25/2012                           NOCTURNAL POLYSOMNOGRAM  REFERRING PHYSICIAN:  Tereso Newcomer, PA-C  INDICATION FOR THE STUDY:  Hypersomnia with sleep apnea.  EPWORTH SLEEPINESS SCORE:  1  MEDICATIONS:  SLEEP ARCHITECTURE:  The patient had a total sleep time of 345 minutes with decreased slow wave sleep and decreased quantity of REM.  Sleep onset latency was normal at 12 minutes and REM onset was normal at 102 minutes.  Sleep efficiency was normal at 93%.  RESPIRATORY DATA:  The patient was found to have 3 central apneas as well as 55 obstructive hypopneas, giving him an apnea-hypopnea index of 10 events per hour.  The events occurred in all body positions, and there was moderate snoring noted throughout.  OXYGEN DATA:  There was O2 desaturation as low as 88% with the patient's obstructive events.  CARDIAC DATA:  No clinically significant arrhythmias were seen.  MOVEMENTS/PARASOMNIA:  The patient had no significant leg jerks or other abnormal behaviors noted.  IMPRESSION/RECOMMENDATION:  Mild obstructive sleep apnea/hypopnea syndrome, with an AHI of 10 events per hour and oxygen desaturation as low as 88%.  Treatment for this degree of sleep apnea can include a trial of weight loss alone, upper airway surgery, dental appliance, and also CPAP.  Clinical correlation is suggested.  The decision to treat this degree of sleep apnea aggressively should be dependent upon its impact to the patient's quality of life.     Barbaraann Share, MD,FCCP Diplomate, American Board of Sleep Medicine   [facility]  D:  12/03/2012 08:39:56  T:  12/03/2012 10:20:19  Job:  [id number]

## 2012-12-04 ENCOUNTER — Telehealth: Payer: Self-pay | Admitting: Internal Medicine

## 2012-12-04 NOTE — Telephone Encounter (Signed)
Moved 9/11 appt from CP2 to CP1 @ 10:30am. Also when r/s appt I saw that an order had been entered 9/3 for doppler (no pof). Doppler scheduled for 9/5 @ 11am. lmonvm for pt re new time for 9/11 f/u @ 10:30am and doppler for 9/5 @ 11am. @ EL. Pt asked to call me if he cannot keep appts. Schedule for 9/11 appt mailed.

## 2012-12-05 ENCOUNTER — Other Ambulatory Visit: Payer: Self-pay | Admitting: *Deleted

## 2012-12-05 ENCOUNTER — Ambulatory Visit: Payer: Self-pay

## 2012-12-05 ENCOUNTER — Encounter: Payer: Self-pay | Admitting: Physician Assistant

## 2012-12-05 ENCOUNTER — Encounter (HOSPITAL_COMMUNITY): Payer: Self-pay

## 2012-12-05 ENCOUNTER — Telehealth: Payer: Self-pay | Admitting: *Deleted

## 2012-12-05 NOTE — Telephone Encounter (Signed)
lmptcb go over sleep study results and recommendations

## 2012-12-08 ENCOUNTER — Telehealth: Payer: Self-pay | Admitting: Physician Assistant

## 2012-12-08 NOTE — Telephone Encounter (Signed)
Will review with [name]

## 2012-12-08 NOTE — Telephone Encounter (Signed)
Pt rtn call to carol re sleep study results

## 2012-12-08 NOTE — Telephone Encounter (Signed)
Reviewed with Scott--mild sleep apnea. Scott recommended weight loss right now. If symptoms do not improve [name] would refer to Dr Shelle Iron for further evaluation. Pt advised.

## 2012-12-09 NOTE — Telephone Encounter (Signed)
See phone note from Ernestine Mcmurray, RN who d/w pt about his sleep study

## 2012-12-10 ENCOUNTER — Ambulatory Visit (HOSPITAL_COMMUNITY): Admission: RE | Admit: 2012-12-10 | Payer: Self-pay | Source: Ambulatory Visit

## 2012-12-11 ENCOUNTER — Ambulatory Visit: Payer: Self-pay

## 2012-12-11 ENCOUNTER — Other Ambulatory Visit: Payer: Self-pay

## 2012-12-12 ENCOUNTER — Telehealth: Payer: Self-pay | Admitting: Internal Medicine

## 2012-12-12 NOTE — Telephone Encounter (Signed)
, °

## 2012-12-31 ENCOUNTER — Other Ambulatory Visit: Payer: Self-pay | Admitting: Internal Medicine

## 2012-12-31 DIAGNOSIS — R590 Localized enlarged lymph nodes: Secondary | ICD-10-CM

## 2012-12-31 DIAGNOSIS — R718 Other abnormality of red blood cells: Secondary | ICD-10-CM

## 2013-01-01 ENCOUNTER — Ambulatory Visit: Payer: Self-pay | Admitting: Lab

## 2013-01-01 ENCOUNTER — Ambulatory Visit: Payer: Self-pay

## 2013-01-01 ENCOUNTER — Telehealth: Payer: Self-pay | Admitting: Internal Medicine

## 2013-01-01 ENCOUNTER — Other Ambulatory Visit: Payer: Self-pay | Admitting: Lab

## 2013-01-01 NOTE — Telephone Encounter (Signed)
lvm for pt regarding to lab add on today... °

## 2013-06-03 ENCOUNTER — Other Ambulatory Visit: Payer: Self-pay

## 2013-06-03 ENCOUNTER — Encounter (HOSPITAL_COMMUNITY): Payer: Self-pay | Admitting: Emergency Medicine

## 2013-06-03 ENCOUNTER — Emergency Department (INDEPENDENT_AMBULATORY_CARE_PROVIDER_SITE_OTHER)
Admission: EM | Admit: 2013-06-03 | Discharge: 2013-06-03 | Disposition: A | Discharge status: Home / Self Care | Payer: Self-pay | Source: Home / Self Care | Attending: Family Medicine | Admitting: Family Medicine

## 2013-06-03 DIAGNOSIS — I10 Essential (primary) hypertension: Secondary | ICD-10-CM

## 2013-06-03 DIAGNOSIS — R7309 Other abnormal glucose: Secondary | ICD-10-CM

## 2013-06-03 DIAGNOSIS — R739 Hyperglycemia, unspecified: Secondary | ICD-10-CM

## 2013-06-03 LAB — POCT I-STAT, CHEM 8
BUN: 10 mg/dL (ref 6–23)
Calcium, Ion: 1.18 mmol/L (ref 1.12–1.23)
Chloride: 104 mEq/L (ref 96–112)
Creatinine, Ser: 1 mg/dL (ref 0.50–1.35)
Glucose, Bld: 115 mg/dL — ABNORMAL HIGH (ref 70–99)
HCT: 47 % (ref 39.0–52.0)
HEMOGLOBIN: 16 g/dL (ref 13.0–17.0)
Potassium: 3.8 mEq/L (ref 3.7–5.3)
SODIUM: 143 meq/L (ref 137–147)
TCO2: 25 mmol/L (ref 0–100)

## 2013-06-03 MED ORDER — LISINOPRIL 20 MG PO TABS: 20.0000 mg | ORAL_TABLET | Freq: Every day | ORAL | Status: DC

## 2013-06-03 MED ORDER — HYDROCHLOROTHIAZIDE 25 MG PO TABS: 25.0000 mg | ORAL_TABLET | Freq: Every day | ORAL | Status: DC

## 2013-06-03 MED ORDER — POTASSIUM CHLORIDE ER 10 MEQ PO TBCR: 10.0000 meq | EXTENDED_RELEASE_TABLET | Freq: Every day | ORAL | Status: DC

## 2013-06-03 MED ORDER — AMLODIPINE BESYLATE 5 MG PO TABS: 5.0000 mg | ORAL_TABLET | Freq: Every day | ORAL | Status: DC

## 2013-06-03 NOTE — ED Notes (Signed)
Pt  Reports   Symptoms  Of  Headaches    /  Dizzy           With  The  Symptoms    Since  Sat            Pt    Has  Ran out  Of  His   BP meds                    -  He     Is  Sitting  Upright on  Exam table  Speaking  In  Complete  sentances

## 2013-06-03 NOTE — ED Provider Notes (Signed)
CSN: [id number]     Arrival date & time 06/03/13  1026 History   First MD Initiated Contact with Patient 06/03/13 1036     Chief Complaint  Patient presents with  . Headache   (Consider location/radiation/quality/duration/timing/severity/associated sxs/prior Treatment) HPI Comments: Patient reports that he has not pursued regular follow up with his PCP and has therefore run out of his daily antihypertensive medications. Last dose of medications was 2 weeks ago. Reports intermittent headaches since running out of meds. Denies focal changes in speech, vision, balance strength or sensation. No chest pain. No changes in urine output.  PCP: [facility]: [place]  The history is provided by the patient.    Past Medical History  Diagnosis Date  . Hypertension   . Prediabetes     A1C 6.1% 08/2012  . Microcytosis     MCV 65  . Abnormal CT scan 08/2012    R axilla adenopathy  . LVH (left ventricular hypertrophy)     08/2712 echo-EF 55-60%, moderate LVH and mild LA dilatation.   . Abnormal platelets 09/26/12    Large platelets  . Abnormal RBC 09/26/12    Elliptocytes  . Hx of cardiovascular stress test     ETT-Myoview 7/14:  Normal, EF 54%, no ischemia  . Mild sleep apnea     a. sleep study 9/14:  mild OSA, AHI 10/hour; O2 desat nadir 88% => recommend weight loss   Past Surgical History  Procedure Laterality Date  . Hand surgery      Right hand, 5years agi   Family History  Problem Relation Age of Onset  . Heart disease Mother    History  Substance Use Topics  . Smoking status: Never Smoker   . Smokeless tobacco: Not on file  . Alcohol Use: No    Review of Systems  All other systems reviewed and are negative.    Allergies  Bee venom and Other  Home Medications   Current Outpatient Rx  Name  Route  Sig  Dispense  Refill  . potassium chloride SA (K-DUR,KLOR-CON) 20 MEQ tablet   Oral   Take 1 tablet (20 mEq total) by mouth daily.   30 tablet    6   . amLODipine (NORVASC) 5 MG tablet   Oral   Take 1 tablet (5 mg total) by mouth daily.   30 tablet   6   . amLODipine (NORVASC) 5 MG tablet   Oral   Take 1 tablet (5 mg total) by mouth daily.   30 tablet   1   . hydrochlorothiazide (HYDRODIURIL) 25 MG tablet   Oral   Take 1 tablet (25 mg total) by mouth daily.   30 tablet   6   . hydrochlorothiazide (HYDRODIURIL) 25 MG tablet   Oral   Take 1 tablet (25 mg total) by mouth daily.   30 tablet   1   . lisinopril (PRINIVIL,ZESTRIL) 20 MG tablet   Oral   Take 1 tablet (20 mg total) by mouth daily.   30 tablet   6   . lisinopril (PRINIVIL,ZESTRIL) 20 MG tablet   Oral   Take 1 tablet (20 mg total) by mouth daily.   30 tablet   1   . potassium chloride (K-DUR) 10 MEQ tablet   Oral   Take 1 tablet (10 mEq total) by mouth daily.   30 tablet   1    BP 180/110  Pulse 82  Temp(Src) 99.5 F (37.5 C) (Oral)  Resp 18  SpO2 98% Physical Exam  Nursing note and vitals reviewed. Constitutional: He is oriented to person, place, and time. He appears well-developed and well-nourished. No distress.  HENT:  Head: Normocephalic and atraumatic.  Right Ear: External ear normal.  Left Ear: External ear normal.  Nose: Nose normal.  Mouth/Throat: Oropharynx is clear and moist.  Eyes: Conjunctivae and EOM are normal. Pupils are equal, round, and reactive to light. Right eye exhibits no discharge. Left eye exhibits no discharge. No scleral icterus.  Neck: Normal range of motion. Neck supple. No JVD present. No thyromegaly present.  Cardiovascular: Normal rate, regular rhythm and normal heart sounds.   Pulmonary/Chest: Effort normal and breath sounds normal. No respiratory distress. He has no wheezes.  Abdominal: Soft. Bowel sounds are normal. He exhibits no distension. There is no tenderness.  Musculoskeletal: Normal range of motion. He exhibits no edema and no tenderness.  Lymphadenopathy:    He has no cervical adenopathy.   Neurological: He is alert and oriented to person, place, and time. No cranial nerve deficit or sensory deficit. He exhibits normal muscle tone. Coordination and gait normal. GCS eye subscore is 4. GCS verbal subscore is 5. GCS motor subscore is 6.  Skin: Skin is warm and dry. No rash noted. No erythema.  Psychiatric: He has a normal mood and affect. His behavior is normal.    ED Course  Procedures (including critical care time) Labs Review Labs Reviewed  POCT I-STAT, CHEM 8 - Abnormal; Notable for the following:    Glucose, Bld 115 (*)    All other components within normal limits   Imaging Review No results found.   MDM   1. Hypertension   2. Hyperglycemia    HTN: stressed the importance of his resuming follow up with the primary care provider of his choice. He states he would prefer to return to the [facility] and I provided him with the contact information for the clinic along with Rx refills until he can re-establish care.  IStat-8 grossly normal, only with mild hyperglycemia.    [place], [place] 06/03/13 1148

## 2013-06-03 NOTE — ED Provider Notes (Signed)
Medical screening examination/treatment/procedure(s) were performed by a resident physician or non-physician practitioner and as the supervising physician I was immediately available for consultation/collaboration.  Lynne Leader, MD   Gregor Hams, MD 06/03/13 [id number]

## 2013-06-03 NOTE — Discharge Instructions (Signed)
Arterial Hypertension °Arterial hypertension (high blood pressure) is a condition of elevated pressure in your blood vessels. Hypertension over a long period of time is a risk factor for strokes, heart attacks, and heart failure. It is also the leading cause of kidney (renal) failure.  °CAUSES  °· In Adults -- Over 90% of all hypertension has no known cause. This is called essential or primary hypertension. In the other 10% of people with hypertension, the increase in blood pressure is caused by another disorder. This is called secondary hypertension. Important causes of secondary hypertension are: °· Heavy alcohol use. °· Obstructive sleep apnea. °· Hyperaldosterosim (Conn's syndrome). °· Steroid use. °· Chronic kidney failure. °· Hyperparathyroidism. °· Medications. °· Renal artery stenosis. °· Pheochromocytoma. °· Cushing's disease. °· Coarctation of the aorta. °· Scleroderma renal crisis. °· Licorice (in excessive amounts). °· Drugs (cocaine, methamphetamine). °Your caregiver can explain any items above that apply to you. °· In Children -- Secondary hypertension is more common and should always be considered. °· Pregnancy -- Few women of childbearing age have high blood pressure. However, up to 10% of them develop hypertension of pregnancy. Generally, this will not harm the woman. It may be a sign of 3 complications of pregnancy: preeclampsia, HELLP syndrome, and eclampsia. Follow up and control with medication is necessary. °SYMPTOMS  °· This condition normally does not produce any noticeable symptoms. It is usually found during a routine exam. °· Malignant hypertension is a late problem of high blood pressure. It may have the following symptoms: °· Headaches. °· Blurred vision. °· End-organ damage (this means your kidneys, heart, lungs, and other organs are being damaged). °· Stressful situations can increase the blood pressure. If a person with normal blood pressure has their blood pressure go up while being  seen by their caregiver, this is often termed "white coat hypertension." Its importance is not known. It may be related with eventually developing hypertension or complications of hypertension. °· Hypertension is often confused with mental tension, stress, and anxiety. °DIAGNOSIS  °The diagnosis is made by 3 separate blood pressure measurements. They are taken at least 1 week apart from each other. If there is organ damage from hypertension, the diagnosis may be made without repeat measurements. °Hypertension is usually identified by having blood pressure readings: °· Above 140/90 mmHg measured in both arms, at 3 separate times, over a couple weeks. °· Over 130/80 mmHg should be considered a risk factor and may require treatment in patients with diabetes. °Blood pressure readings over 120/80 mmHg are called "pre-hypertension" even in non-diabetic patients. °To get a true blood pressure measurement, use the following guidelines. Be aware of the factors that can alter blood pressure readings. °· Take measurements at least 1 hour after caffeine. °· Take measurements 30 minutes after smoking and without any stress. This is another reason to quit smoking  it raises your blood pressure. °· Use a proper cuff size. Ask your caregiver if you are not sure about your cuff size. °· Most home blood pressure cuffs are automatic. They will measure systolic and diastolic pressures. The systolic pressure is the pressure reading at the start of sounds. Diastolic pressure is the pressure at which the sounds disappear. If you are elderly, measure pressures in multiple postures. Try sitting, lying or standing. °· Sit at rest for a minimum of 5 minutes before taking measurements. °· You should not be on any medications like decongestants. These are found in many cold medications. °· Record your blood pressure readings and review   them with your caregiver. °If you have hypertension: °· Your caregiver may do tests to be sure you do not have  secondary hypertension (see "causes" above). °· Your caregiver may also look for signs of metabolic syndrome. This is also called Syndrome X or Insulin Resistance Syndrome. You may have this syndrome if you have type 2 diabetes, abdominal obesity, and abnormal blood lipids in addition to hypertension. °· Your caregiver will take your medical and family history and perform a physical exam. °· Diagnostic tests may include blood tests (for glucose, cholesterol, potassium, and kidney function), a urinalysis, or an EKG. Other tests may also be necessary depending on your condition. °PREVENTION  °There are important lifestyle issues that you can adopt to reduce your chance of developing hypertension: °· Maintain a normal weight. °· Limit the amount of salt (sodium) in your diet. °· Exercise often. °· Limit alcohol intake. °· Get enough potassium in your diet. Discuss specific advice with your caregiver. °· Follow a DASH diet (dietary approaches to stop hypertension). This diet is rich in fruits, vegetables, and low-fat dairy products, and avoids certain fats. °PROGNOSIS  °Essential hypertension cannot be cured. Lifestyle changes and medical treatment can lower blood pressure and reduce complications. The prognosis of secondary hypertension depends on the underlying cause. Many people whose hypertension is controlled with medicine or lifestyle changes can live a normal, healthy life.  °RISKS AND COMPLICATIONS  °While high blood pressure alone is not an illness, it often requires treatment due to its short- and long-term effects on many organs. Hypertension increases your risk for: °· CVAs or strokes (cerebrovascular accident). °· Heart failure due to chronically high blood pressure (hypertensive cardiomyopathy). °· Heart attack (myocardial infarction). °· Damage to the retina (hypertensive retinopathy). °· Kidney failure (hypertensive nephropathy). °Your caregiver can explain list items above that apply to you. Treatment  of hypertension can significantly reduce the risk of complications. °TREATMENT  °· For overweight patients, weight loss and regular exercise are recommended. Physical fitness lowers blood pressure. °· Mild hypertension is usually treated with diet and exercise. A diet rich in fruits and vegetables, fat-free dairy products, and foods low in fat and salt (sodium) can help lower blood pressure. Decreasing salt intake decreases blood pressure in a 1/3 of people. °· Stop smoking if you are a smoker. °The steps above are highly effective in reducing blood pressure. While these actions are easy to suggest, they are difficult to achieve. Most patients with moderate or severe hypertension end up requiring medications to bring their blood pressure down to a normal level. There are several classes of medications for treatment. Blood pressure pills (antihypertensives) will lower blood pressure by their different actions. Lowering the blood pressure by 10 mmHg may decrease the risk of complications by as much as 25%. °The goal of treatment is effective blood pressure control. This will reduce your risk for complications. Your caregiver will help you determine the best treatment for you according to your lifestyle. What is excellent treatment for one person, may not be for you. °HOME CARE INSTRUCTIONS  °· Do not smoke. °· Follow the lifestyle changes outlined in the "Prevention" section. °· If you are on medications, follow the directions carefully. Blood pressure medications must be taken as prescribed. Skipping doses reduces their benefit. It also puts you at risk for problems. °· Follow up with your caregiver, as directed. °· If you are asked to monitor your blood pressure at home, follow the guidelines in the "Diagnosis" section above. °SEEK MEDICAL CARE   IF:   You think you are having medication side effects.  You have recurrent headaches or lightheadedness.  You have swelling in your ankles.  You have trouble with  your vision. SEEK IMMEDIATE MEDICAL CARE IF:   You have sudden onset of chest pain or pressure, difficulty breathing, or other symptoms of a heart attack.  You have a severe headache.  You have symptoms of a stroke (such as sudden weakness, difficulty speaking, difficulty walking). MAKE SURE YOU:   Understand these instructions.  Will watch your condition.  Will get help right away if you are not doing well or get worse. Document Released: 03/19/2005 Document Revised: 06/11/2011 Document Reviewed: 10/17/2006 [facility] Patient Information 2014 [place].  You must re-establish care at the [facility] and [facility] for continued management of your high blood pressure. Please call for an appointment as soon as possible.

## 2013-07-09 ENCOUNTER — Emergency Department (HOSPITAL_COMMUNITY): Payer: Self-pay

## 2013-07-09 ENCOUNTER — Emergency Department (HOSPITAL_COMMUNITY)
Admission: EM | Admit: 2013-07-09 | Discharge: 2013-07-10 | Disposition: A | Discharge status: Home / Self Care | Payer: Self-pay | Attending: Emergency Medicine | Admitting: Emergency Medicine

## 2013-07-09 ENCOUNTER — Encounter (HOSPITAL_COMMUNITY): Payer: Self-pay | Admitting: Emergency Medicine

## 2013-07-09 DIAGNOSIS — Z862 Personal history of diseases of the blood and blood-forming organs and certain disorders involving the immune mechanism: Secondary | ICD-10-CM | POA: Insufficient documentation

## 2013-07-09 DIAGNOSIS — S3981XA Other specified injuries of abdomen, initial encounter: Secondary | ICD-10-CM | POA: Insufficient documentation

## 2013-07-09 DIAGNOSIS — S99919A Unspecified injury of unspecified ankle, initial encounter: Secondary | ICD-10-CM

## 2013-07-09 DIAGNOSIS — S0003XA Contusion of scalp, initial encounter: Secondary | ICD-10-CM | POA: Insufficient documentation

## 2013-07-09 DIAGNOSIS — S0993XA Unspecified injury of face, initial encounter: Secondary | ICD-10-CM | POA: Insufficient documentation

## 2013-07-09 DIAGNOSIS — W11XXXA Fall on and from ladder, initial encounter: Secondary | ICD-10-CM | POA: Insufficient documentation

## 2013-07-09 DIAGNOSIS — S99929A Unspecified injury of unspecified foot, initial encounter: Secondary | ICD-10-CM

## 2013-07-09 DIAGNOSIS — Y929 Unspecified place or not applicable: Secondary | ICD-10-CM | POA: Insufficient documentation

## 2013-07-09 DIAGNOSIS — Z79899 Other long term (current) drug therapy: Secondary | ICD-10-CM | POA: Insufficient documentation

## 2013-07-09 DIAGNOSIS — M79671 Pain in right foot: Secondary | ICD-10-CM

## 2013-07-09 DIAGNOSIS — I10 Essential (primary) hypertension: Secondary | ICD-10-CM | POA: Insufficient documentation

## 2013-07-09 DIAGNOSIS — S199XXA Unspecified injury of neck, initial encounter: Secondary | ICD-10-CM

## 2013-07-09 DIAGNOSIS — S8990XA Unspecified injury of unspecified lower leg, initial encounter: Secondary | ICD-10-CM | POA: Insufficient documentation

## 2013-07-09 DIAGNOSIS — S7010XA Contusion of unspecified thigh, initial encounter: Secondary | ICD-10-CM | POA: Insufficient documentation

## 2013-07-09 DIAGNOSIS — S0083XA Contusion of other part of head, initial encounter: Principal | ICD-10-CM

## 2013-07-09 DIAGNOSIS — Y9389 Activity, other specified: Secondary | ICD-10-CM | POA: Insufficient documentation

## 2013-07-09 DIAGNOSIS — S1093XA Contusion of unspecified part of neck, initial encounter: Principal | ICD-10-CM

## 2013-07-09 DIAGNOSIS — S7011XA Contusion of right thigh, initial encounter: Secondary | ICD-10-CM

## 2013-07-09 LAB — I-STAT CHEM 8, ED
BUN: 16 mg/dL (ref 6–23)
CREATININE: 1.2 mg/dL (ref 0.50–1.35)
Calcium, Ion: 1.2 mmol/L (ref 1.12–1.23)
Chloride: 103 mEq/L (ref 96–112)
GLUCOSE: 97 mg/dL (ref 70–99)
HCT: 41 % (ref 39.0–52.0)
HEMOGLOBIN: 13.9 g/dL (ref 13.0–17.0)
Potassium: 3.7 mEq/L (ref 3.7–5.3)
Sodium: 142 mEq/L (ref 137–147)
TCO2: 27 mmol/L (ref 0–100)

## 2013-07-09 LAB — URINALYSIS, ROUTINE W REFLEX MICROSCOPIC
BILIRUBIN URINE: NEGATIVE
Glucose, UA: NEGATIVE mg/dL
Hgb urine dipstick: NEGATIVE
Ketones, ur: 15 mg/dL — AB
Leukocytes, UA: NEGATIVE
NITRITE: NEGATIVE
Protein, ur: 30 mg/dL — AB
Specific Gravity, Urine: 1.036 — ABNORMAL HIGH (ref 1.005–1.030)
UROBILINOGEN UA: 0.2 mg/dL (ref 0.0–1.0)
pH: 6 (ref 5.0–8.0)

## 2013-07-09 LAB — URINE MICROSCOPIC-ADD ON

## 2013-07-09 MED ORDER — FENTANYL CITRATE 0.05 MG/ML IJ SOLN
50.0000 ug | Freq: Once | INTRAMUSCULAR | Status: AC
  Administered 2013-07-09: 50 ug via INTRAVENOUS
  Filled 2013-07-09: qty 2

## 2013-07-09 MED ORDER — IOHEXOL 300 MG/ML  SOLN
100.0000 mL | Freq: Once | INTRAMUSCULAR | Status: AC | PRN
  Administered 2013-07-09: 100 mL via INTRAVENOUS

## 2013-07-09 NOTE — ED Provider Notes (Signed)
CSN: [id number]     Arrival date & time 07/09/13  1510 History   First MD Initiated Contact with Patient 07/09/13 1628     Chief Complaint  Patient presents with  . Fall    2 stories     (Consider location/radiation/quality/duration/timing/severity/associated sxs/prior Treatment) Patient is a 33 y.o. male presenting with fall. The history is provided by the patient. No language interpreter was used.  Fall This is a new problem. The current episode started yesterday. The problem occurs rarely. The problem has been gradually worsening. Associated symptoms include arthralgias, chest pain and myalgias. The symptoms are aggravated by standing and walking. He has tried rest and NSAIDs for the symptoms. The treatment provided mild relief.  Patient states he was pressure washing the outside wall of the second story of a house when the ladder slipped, resulting in a drop to the ground from a distance of about 10 feet.  Patient landed primarily on his right side/hip area.  Denies loss of consciousness.  States he was "dazed" after the fall, with fleeting nausea that completely resolved.  Patient reports difficulty walking due to pain in hip and foot.  Lateral rib pain on right.   Past Medical History  Diagnosis Date  . Hypertension   . Prediabetes     A1C 6.1% 08/2012  . Microcytosis     MCV 65  . Abnormal CT scan 08/2012    R axilla adenopathy  . LVH (left ventricular hypertrophy)     08/2712 echo-EF 55-60%, moderate LVH and mild LA dilatation.   . Abnormal platelets 09/26/12    Large platelets  . Abnormal RBC 09/26/12    Elliptocytes  . Hx of cardiovascular stress test     ETT-Myoview 7/14:  Normal, EF 54%, no ischemia  . Mild sleep apnea     a. sleep study 9/14:  mild OSA, AHI 10/hour; O2 desat nadir 88% => recommend weight loss   Past Surgical History  Procedure Laterality Date  . Hand surgery      Right hand, 5years agi   Family History  Problem Relation Age of Onset  . Heart disease  Mother    History  Substance Use Topics  . Smoking status: Never Smoker   . Smokeless tobacco: Not on file  . Alcohol Use: No    Review of Systems  Cardiovascular: Positive for chest pain.  Musculoskeletal: Positive for arthralgias, myalgias and neck stiffness.  All other systems reviewed and are negative.     Allergies  Bee venom and Other  Home Medications   Current Outpatient Rx  Name  Route  Sig  Dispense  Refill  . amLODipine (NORVASC) 5 MG tablet   Oral   Take 1 tablet (5 mg total) by mouth daily.   30 tablet   6   . hydrochlorothiazide (HYDRODIURIL) 25 MG tablet   Oral   Take 1 tablet (25 mg total) by mouth daily.   30 tablet   6   . lisinopril (PRINIVIL,ZESTRIL) 20 MG tablet   Oral   Take 1 tablet (20 mg total) by mouth daily.   30 tablet   6   . potassium chloride (K-DUR) 10 MEQ tablet   Oral   Take 1 tablet (10 mEq total) by mouth daily.   30 tablet   1    BP 138/87  Pulse 75  Temp(Src) 98.7 F (37.1 C)  Resp 18  SpO2 99% Physical Exam  Nursing note and vitals reviewed. Constitutional: He is  oriented to person, place, and time. He appears well-developed and well-nourished.  HENT:  Head: Normocephalic and atraumatic.  Eyes: Pupils are equal, round, and reactive to light.  Neck: Normal range of motion. Neck supple.  Right lateral neck stiffness.  Good ROM. No spinous process tenderness.  Equal upper extremity strength.  Cardiovascular: Normal rate and regular rhythm.   Pulmonary/Chest: Effort normal and breath sounds normal. He exhibits tenderness.  Equal chest wall movement without crepitus.  Abdominal: Soft. Bowel sounds are normal. He exhibits no distension. There is no tenderness.  Musculoskeletal: He exhibits tenderness.  Hematoma to right upper lateral thigh.  Neurological: He is alert and oriented to person, place, and time. No cranial nerve deficit. Coordination normal.  Skin: Skin is warm and dry.  Psychiatric: He has a normal  mood and affect.    ED Course  Procedures (including critical care time) Labs Review Labs Reviewed  URINALYSIS, ROUTINE W REFLEX MICROSCOPIC  I-STAT CHEM 8, ED   Imaging Review No results found.   EKG Interpretation None     Patient discussed with and seen by Dr. Wyvonnia Dusky. Lab and radiology results reviewed and shared with patient. Difficulty ambulating due to foot pain.  No fracture identified.  Crutches, gradual increase of weight bearing, pain medication, follow-up with orthopedics if no improvement. MDM   Final diagnoses:  None    Fall from ladder. Right thigh contusion. Right foot pain.    Norman Herrlich, NP 07/10/13 [id number]

## 2013-07-09 NOTE — ED Notes (Signed)
Pt states he was pressure washing a sign and the ladder slipped.  He fell 2 stories, landed on a trash can.  No loc.  Pt now c/o R neck pain, R rib pain and R leg pain.  Hematoma to R thigh.  No crepitus noted to R anterior chest.  Pt refused to wear C-collar.

## 2013-07-09 NOTE — Discharge Instructions (Signed)
Blunt Trauma  You have been evaluated for injuries. You have been examined and your caregiver has not found injuries serious enough to require hospitalization.  It is common to have multiple bruises and sore muscles following an accident. These tend to feel worse for the first 24 hours. You will feel more stiffness and soreness over the next several hours and worse when you wake up the first morning after your accident. After this point, you should begin to improve with each passing day. The amount of improvement depends on the amount of damage done in the accident.  Following your accident, if some part of your body does not work as it should, or if the pain in any area continues to increase, you should return to the Emergency Department for re-evaluation.   HOME CARE INSTRUCTIONS   Routine care for sore areas should include:  · Ice to sore areas every 2 hours for 20 minutes while awake for the next 2 days.  · Drink extra fluids (not alcohol).  · Take a hot or warm shower or bath once or twice a day to increase blood flow to sore muscles. This will help you "limber up".  · Activity as tolerated. Lifting may aggravate neck or back pain.  · Only take over-the-counter or prescription medicines for pain, discomfort, or fever as directed by your caregiver. Do not use aspirin. This may increase bruising or increase bleeding if there are small areas where this is happening.  SEEK IMMEDIATE MEDICAL CARE IF:  · Numbness, tingling, weakness, or problem with the use of your arms or legs.  · A severe headache is not relieved with medications.  · There is a change in bowel or bladder control.  · Increasing pain in any areas of the body.  · Short of breath or dizzy.  · Nauseated, vomiting, or sweating.  · Increasing belly (abdominal) discomfort.  · Blood in urine, stool, or vomiting blood.  · Pain in either shoulder in an area where a shoulder strap would be.  · Feelings of lightheadedness or if you have a fainting  episode.  Sometimes it is not possible to identify all injuries immediately after the trauma. It is important that you continue to monitor your condition after the emergency department visit. If you feel you are not improving, or improving more slowly than should be expected, call your physician. If you feel your symptoms (problems) are worsening, return to the Emergency Department immediately.  Document Released: 12/13/2000 Document Revised: 06/11/2011 Document Reviewed: 11/05/2007  [employer]® Patient Information ©2014 [employer].

## 2013-07-09 NOTE — ED Notes (Signed)
The tech ambulated the patient from the side of the bed to the foot of the bed. The patient complained of right side leg and foot pain(no weigh barriering). The tech has reported to the RN in charge.

## 2013-07-10 MED ORDER — HYDROCODONE-ACETAMINOPHEN 5-325 MG PO TABS: 1.0000 | ORAL_TABLET | Freq: Four times a day (QID) | ORAL | Status: DC | PRN

## 2013-07-11 NOTE — ED Provider Notes (Signed)
Medical screening examination/treatment/procedure(s) were conducted as a shared visit with non-physician practitioner(s) and myself.  I personally evaluated the patient during the encounter.  Fall from ladder 1 day ago, about 10 feet onto trash can.  No LOC, but "dazed." c/o R rib, R thigh an R foot pain.  GCS 15, ABCs intact. No C spine tenderness, NEXUS negative. Lungs clear, no crepitance. Abdomen soft. Hematoma and ecchymosis R lateral thigh. Intact DP and PT pulse, compartments soft.   EKG Interpretation None       Ezequiel Essex, MD 07/11/13 2012

## 2013-11-04 ENCOUNTER — Other Ambulatory Visit: Payer: Self-pay | Admitting: Physician Assistant

## 2013-12-08 ENCOUNTER — Emergency Department (INDEPENDENT_AMBULATORY_CARE_PROVIDER_SITE_OTHER): Payer: Self-pay

## 2013-12-08 ENCOUNTER — Encounter (HOSPITAL_COMMUNITY): Payer: Self-pay | Admitting: Emergency Medicine

## 2013-12-08 ENCOUNTER — Emergency Department (INDEPENDENT_AMBULATORY_CARE_PROVIDER_SITE_OTHER)
Admission: EM | Admit: 2013-12-08 | Discharge: 2013-12-08 | Disposition: A | Discharge status: Home / Self Care | Payer: Self-pay | Source: Home / Self Care | Attending: Emergency Medicine | Admitting: Emergency Medicine

## 2013-12-08 DIAGNOSIS — M7660 Achilles tendinitis, unspecified leg: Secondary | ICD-10-CM

## 2013-12-08 DIAGNOSIS — M7661 Achilles tendinitis, right leg: Secondary | ICD-10-CM

## 2013-12-08 DIAGNOSIS — I10 Essential (primary) hypertension: Secondary | ICD-10-CM

## 2013-12-08 LAB — POCT I-STAT, CHEM 8
BUN: 14 mg/dL (ref 6–23)
CHLORIDE: 107 meq/L (ref 96–112)
CREATININE: 1.2 mg/dL (ref 0.50–1.35)
Calcium, Ion: 1.16 mmol/L (ref 1.12–1.23)
Glucose, Bld: 99 mg/dL (ref 70–99)
HCT: 45 % (ref 39.0–52.0)
Hemoglobin: 15.3 g/dL (ref 13.0–17.0)
POTASSIUM: 3.9 meq/L (ref 3.7–5.3)
SODIUM: 141 meq/L (ref 137–147)
TCO2: 30 mmol/L (ref 0–100)

## 2013-12-08 MED ORDER — ACETAMINOPHEN 325 MG PO TABS
650.0000 mg | ORAL_TABLET | Freq: Once | ORAL | Status: AC
  Administered 2013-12-08: 650 mg via ORAL

## 2013-12-08 MED ORDER — HYDROCODONE-ACETAMINOPHEN 5-325 MG PO TABS: ORAL_TABLET | ORAL | Status: DC

## 2013-12-08 MED ORDER — LISINOPRIL 40 MG PO TABS: 40.0000 mg | ORAL_TABLET | Freq: Every day | ORAL | Status: DC

## 2013-12-08 MED ORDER — POTASSIUM CHLORIDE ER 10 MEQ PO TBCR: 10.0000 meq | EXTENDED_RELEASE_TABLET | Freq: Every day | ORAL | Status: DC

## 2013-12-08 MED ORDER — CLONIDINE HCL 0.2 MG PO TABS: 0.2000 mg | ORAL_TABLET | Freq: Two times a day (BID) | ORAL | Status: DC

## 2013-12-08 MED ORDER — ACETAMINOPHEN 325 MG PO TABS
ORAL_TABLET | ORAL | Status: AC
  Filled 2013-12-08: qty 2

## 2013-12-08 MED ORDER — CHLORTHALIDONE 25 MG PO TABS: 25.0000 mg | ORAL_TABLET | Freq: Every day | ORAL | Status: DC

## 2013-12-08 MED ORDER — AMLODIPINE BESYLATE 10 MG PO TABS: 10.0000 mg | ORAL_TABLET | Freq: Every day | ORAL | Status: DC

## 2013-12-08 NOTE — Discharge Instructions (Signed)
Achilles Tendinitis Achilles tendinitis is inflammation of the tough, cord-like band that attaches the lower muscles of your leg to your heel (Achilles tendon). It is usually caused by overusing the tendon and joint involved.  CAUSES Achilles tendinitis can happen because of:  A sudden increase in exercise or activity (such as running).  Doing the same exercises or activities (such as jumping) over and over.  Not warming up calf muscles before exercising.  Exercising in shoes that are worn out or not made for exercise.  Having arthritis or a bone growth on the back of the heel bone. This can rub against the tendon and hurt the tendon. SIGNS AND SYMPTOMS The most common symptoms are:  Pain in the back of the leg, just above the heel. The pain usually gets worse with exercise and better with rest.  Stiffness or soreness in the back of the leg, especially in the morning.  Swelling of the skin over the Achilles tendon.  Trouble standing on tiptoe. Sometimes, an Achilles tendon tears (ruptures). Symptoms of an Achilles tendon rupture can include:  Sudden, severe pain in the back of the leg.  Trouble putting weight on the foot or walking normally. DIAGNOSIS Achilles tendinitis will be diagnosed based on symptoms and a physical examination. An X-ray may be done to check if another condition is causing your symptoms. An MRI may be ordered if your health care provider suspects you may have completely torn your tendon, which is called an Achilles tendon rupture.  TREATMENT  Achilles tendinitis usually gets better over time. It can take weeks to months to heal completely. Treatment focuses on treating the symptoms and helping the injury heal. HOME CARE INSTRUCTIONS   Rest your Achilles tendon and avoid activities that cause pain.  Apply ice to the injured area:  Put ice in a plastic bag.  Place a towel between your skin and the bag.  Leave the ice on for 20 minutes, 2-3 times a  day  Try to avoid using the tendon (other than gentle range of motion) while the tendon is painful. Do not resume use until instructed by your health care provider. Then begin use gradually. Do not increase use to the point of pain. If pain does develop, decrease use and continue the above measures. Gradually increase activities that do not cause discomfort until you achieve normal use.  Do exercises to make your calf muscles stronger and more flexible. Your health care provider or physical therapist can recommend exercises for you to do.  Wrap your ankle with an elastic bandage or other wrap. This can help keep your tendon from moving too much. Your health care provider will show you how to wrap your ankle correctly.  Only take over-the-counter or prescription medicines for pain, discomfort, or fever as directed by your health care provider. SEEK MEDICAL CARE IF:   Your pain and swelling increase or pain is uncontrolled with medicines.  You develop new, unexplained symptoms or your symptoms get worse.  You are unable to move your toes or foot.  You develop warmth and swelling in your foot.  You have an unexplained temperature. MAKE SURE YOU:   Understand these instructions.  Will watch your condition.  Will get help right away if you are not doing well or get worse. Document Released: 12/27/2004 Document Revised: 01/07/2013 Document Reviewed: 10/29/2012 [facility] Patient Information 2015 [place], [place]. This information is not intended to replace advice given to you by your health care provider. Make sure you discuss  any questions you have with your health care provider.   Blood pressure over the ideal can put you at higher risk for stroke, heart disease, and kidney failure.  For this reason, it's important to try to get your blood pressure as close as possible to the ideal.  The ideal blood pressure is 120/80.  Blood pressures from 332-951 systolic over 88-41 diastolic are labeled  as "prehypertension."  This means you are at higher risk of developing hypertension in the future.  Blood pressures in this range are not treated with medication, but lifestyle changes are recommended to prevent progression to hypertension.  Blood pressures of 660 and above systolic over 90 and above diastolic are classified as hypertension and are treated with medications.  Lifestyle changes which can benefit both prehypertension and hypertension include the following:   Salt and sodium restriction.  Weight loss.  Regular exercise.  Avoidance of tobacco.  Avoidance of excess alcohol.  The "D.A.S.H" diet.   People with hypertension and prehypertension should limit their salt intake to less than 1500 mg daily.  Reading the nutrition information on the label of many prepared foods can give you an idea of how much sodium you're consuming at each meal.  Remember that the most important number on the nutrition information is the serving size.  It may be smaller than you think.  Try to avoid adding extra salt at the table.  You may add small amounts of salt while cooking.  Remember that salt is an acquired taste and you may get used to a using a whole lot less salt than you are using now.  Using less salt lets the food's natural flavors come through.  You might want to consider using salt substitutes, potassium chloride, pepper, or blends of herbs and spices to enhance the flavor of your food.  Foods that contain the most salt include: processed meats (like ham, bacon, lunch meat, sausage, hot dogs, and breakfast meat), chips, pretzels, salted nuts, soups, salty snacks, canned foods, junk food, fast food, restaurant food, mustard, pickles, pizza, popcorn, soy sauce, and worcestershire sauce--quite a list!  You might ask, "Is there anything I can eat?"  The answer is, "yes."  Fruits and vegetables are usually low in salt.  Fresh is better than frozen which is better than canned.  If you have canned  vegetables, you can cut down on the salt content by rinsing them in tap water 3 times before cooking.     Weight loss is the second thing you can do to lower your blood pressure.  Getting to and maintaining ideal weight will often normalize your blood pressure and allow you to avoid medications, entirely, cut way down on your dosage of medications, or allow to wean off your meds.  (Note, this should only be done under the supervision of your primary care doctor.)  Of course, weight loss takes time and you may need to be on medication in the meantime.  You shoot for a body mass index of 20-25.  When you go to the urgent care or to your primary care doctor, they should calculate your BMI.  If you don't know what it is, ask.  You can calculate your BMI with the following formula:  Weight in pounds x 703/ (height in inches) x (height in inches).  There are many good diets out there: Weight Watchers and the D.A.S.H. Diet are the best, but often, just modifying a few factors can be helpful:  Don't skip meals, don't  eat out, and keeping a food diary.  I do not recommend fad diets or diet pills which often raise blood pressure.    Everyone should get regular exercise, but this is particularly important for people with high blood pressure.  Just about any exercise is good.  The only exercise which may be harmful is lifting extreme heavy weights.  I recommend moderate exercise such as walking for 30 minutes 5 days a week.  Going to the gym for a 50 minute workout 3 times a week is also good.  This amounts to 150 minutes of exercise weekly.   Anyone with high blood pressure should avoid any use of tobacco.  Tobacco use does not elevate blood pressure, but it increases the risk of heart disease and stroke.  If you are interested in quitting, discuss with your doctor how to quit.  If you are not interested in quitting, ask yourself, "What would my life be like in 10 years if I continue to smoke?"  "How will I know when  it is time to quit?"  "How would my life be better if I were to quit."   Excess alcohol intake can raise the blood pressure.  The safe alcohol intake is 2 drinks or less per day for men and 1 drink per day or less for women.   There is a very good diet which I recommend that has been designed for people with blood pressure called the D.A.S.H. Diet (dietary approaches to stop hypertension).  It consists of fruits, vegetables, lean meats, low fat dairy, whole grains, nuts and seeds.  It is very low in salt and sodium.  It has also been found to have other beneficial health effects such as lowering cholesterol and helping lose weight.  It has been developed by the [employer] and can be downloaded from the internet without any cost. Just do a Development worker, community on "D.A.S.H. Diet." or go the NIH website (MasterBoxes.it).  There are also cookbooks and diet plans that can be gotten from [place] to help you with this diet.

## 2013-12-08 NOTE — ED Notes (Signed)
R foot numbness when he wakes up in AM since end of April.  Pain from heel to middle of foot since end of April and it comes and goes. Pain radiates up to mid calf.

## 2013-12-08 NOTE — ED Provider Notes (Signed)
Chief Complaint   Chief Complaint  Patient presents with  . Foot Pain    History of Present Illness   Darren Little is a 33 year old male who has had a five-month history of pain localized over his right Achilles tendon, but radiating up into the lower leg, into the ankle, around the calcaneus, and into the foot. This is worse first thing in the morning when he gets up and after he's been on his feet for a while. 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 He also has numbness in his foot in the morning for about 30 minutes and this will also recur when he walks. The leg feels weak. He denies any injury to the ankle, however he fell 2 stories back in April before the pain occurred. He thinks he just injured his hip at that time and there was no injury to the ankle. There is no swelling of the ankle. He denies any back or hip pain right now. He has no history of gout. He denies any fever or chills. He's had multiple other medical problems including chest pain for which he has undergone a complete workup including a stress test and sleep apnea test. He had a swollen lymph node in his right axilla on a chest CT scan. He went to the [facility] and had this checked on. They recommended a biopsy, but he never did have it done. He's not sure whether the lymph node is still there or not. He denies any other lymph nodes anywhere else in his body. He also had blood work which showed some abnormal red blood cells and platelets. He has sleep apnea and sees Dr. Gwenette Little for this. He's also been told he is anemic and has borderline diabetes. He has had severe high blood pressure for the past several years. He is on hydrochlorothiazide, lisinopril, amlodipine. He hasn't seen a primary care doctor for months or perhaps over a year and he did not take his blood pressure medication this morning. He denies any headache, blurry vision, confusion, disorientation, shortness of breath, chest discomfort, or neurological symptoms  other than the numbness in the foot.  Review of Systems   Other than as noted above, the patient denies any of the following symptoms: Systemic:  No fevers or chills. Musculoskeletal:  No joint pain or arthritis.  Neurological:  No muscular weakness, paresthesias.   [place]   Past medical history, family history, social history, meds, and allergies were reviewed.     Physical  Examination     Vital signs:  BP 186/125  Pulse 69  Temp(Src) 98.2 F (36.8 C) (Oral)  Resp 20  SpO2 100% Gen:  Alert and oriented times 3.  In no distress. Lungs: Clear to auscultation. Heart: Rate or rhythm, no gallop or murmur. Abdomen: Soft and nontender, no organomegaly or mass. Musculoskeletal:  Exam of the foot reveals pain to palpation most marked over the Achilles tendon, extending up to the distal calf area, and around the calcaneus. There is no swelling, erythema, or heat.  Otherwise, all joints had a full a ROM with no swelling, bruising or deformity.  No edema, pulses full. Extremities were warm and pink.  Capillary refill was brisk.  Skin:  Clear, warm and dry.  No rash. Neuro:  Alert and oriented times 3.  Muscle strength was normal.  DTRs are normal, he has diminished sensation over the lateral aspect of the right foot to light touch.   Radiology   Dg Ankle Complete Right  12/08/2013   CLINICAL DATA:  Right ankle pain for 5 months following injury.  EXAM: RIGHT ANKLE - COMPLETE 3+ VIEW  COMPARISON:  Right foot CT - 07/09/2013; right foot radiographs - 07/09/2013  FINDINGS: No fracture or dislocation. Joint spaces are preserved. The ankle mortise is preserved. No ankle joint effusion. Moderate-sized plantar calcaneal spur. Worsening a mixed sclerosis involving the posterior callus cutaneous regional to the Achilles tendon insertion site. Previously noted enthesopathic change involving the posterior calcaneus is less conspicuous on the present examination.  IMPRESSION: 1. No fracture. 2. Nonspecific  worsening mixed sclerosis involving the posterior aspect of the calcaneus with associated apparent decreased enthesopathic change of the Achilles tendon insertion site - these findings are nonspecific though correlate for point tenderness at this location is recommended as tendinopathy could result in a similar appearance. Further evaluation could be performed with MRI as clinically indicated.   Electronically Signed   By: Darren Little M.D.   On: 12/08/2013 20:59   I reviewed the images independently and personally and concur with the radiologist's findings.  Course in [facility]gent [place]   He was placed in a Cam Walker boot and given crutches for ambulation.  Assessment   The primary encounter diagnosis was Achilles tendinitis of right lower extremity. A diagnosis of Essential hypertension was also pertinent to this visit.  He has severe high blood pressure, but no evidence of hypertensive urgency or emergency such as hypertensive encephalopathy, acute coronary syndrome, congestive heart failure, subarachnoid or cerebral hemorrhage, stroke, aortic aneurysm, or acute kidney failure. He needs gradual, steady blood pressure lowering. I have changed all his blood pressure medications around, and have advised that he followup with community health and wellness for his blood pressure. Darren Little will be getting in touch with him to get this done. He also needs to followup with an orthopedist as soon as possible. She will also work to facilitate this as well. No work in the meantime.  Plan    1.  Meds:  The following meds were prescribed:   New Prescriptions   AMLODIPINE (NORVASC) 10 MG TABLET    Take 1 tablet (10 mg total) by mouth daily.   CHLORTHALIDONE (HYGROTON) 25 MG TABLET    Take 1 tablet (25 mg total) by mouth daily.   CLONIDINE (CATAPRES) 0.2 MG TABLET    Take 1 tablet (0.2 mg total) by mouth 2 (two) times daily.   HYDROCODONE-ACETAMINOPHEN (NORCO/VICODIN) 5-325 MG PER TABLET    1 to 2 tabs  every 4 to 6 hours as needed for pain.   LISINOPRIL (PRINIVIL,ZESTRIL) 40 MG TABLET    Take 1 tablet (40 mg total) by mouth daily.   POTASSIUM CHLORIDE (K-DUR) 10 MEQ TABLET    Take 1 tablet (10 mEq total) by mouth daily.    2.  Patient Education/Counseling:  The patient was given appropriate handouts, self care instructions, and instructed in symptomatic relief including rest and activity, elevation, application of ice and compression.  Discussed blood pressure control, the importance of taking his medication, avoidance of salt and sodium, and appropriate importance of following up with his primary care doctor and with the orthopedist. He should wear the Cam Walker at all times except when showering or in bed. He is to be nonweightbearing until he sees the orthopedist.  3.  Follow up:  The patient was told to follow up here if no better in 3 to 4 days, or sooner if becoming worse in any way, and given some red  flag symptoms such as worsening pain or neurological symptoms which would prompt immediate return.  Followup with community health and wellness for his blood pressure and with Dr. Fredonia Highland for his Achilles tendinitis.  Harden Mo, MD 12/08/13 [phone]

## 2014-01-09 ENCOUNTER — Encounter (HOSPITAL_COMMUNITY): Payer: Self-pay | Admitting: Emergency Medicine

## 2014-01-09 ENCOUNTER — Emergency Department (HOSPITAL_COMMUNITY)
Admission: EM | Admit: 2014-01-09 | Discharge: 2014-01-09 | Disposition: A | Discharge status: Home / Self Care | Payer: Self-pay | Attending: Emergency Medicine | Admitting: Emergency Medicine

## 2014-01-09 DIAGNOSIS — H18211 Corneal edema secondary to contact lens, right eye: Secondary | ICD-10-CM | POA: Insufficient documentation

## 2014-01-09 DIAGNOSIS — I10 Essential (primary) hypertension: Secondary | ICD-10-CM | POA: Insufficient documentation

## 2014-01-09 DIAGNOSIS — Z79899 Other long term (current) drug therapy: Secondary | ICD-10-CM | POA: Insufficient documentation

## 2014-01-09 MED ORDER — ERYTHROMYCIN 5 MG/GM OP OINT
1.0000 "application " | TOPICAL_OINTMENT | Freq: Once | OPHTHALMIC | Status: AC
  Administered 2014-01-09: 1 via OPHTHALMIC
  Filled 2014-01-09: qty 3.5

## 2014-01-09 MED ORDER — FLUORESCEIN SODIUM 1 MG OP STRP
1.0000 | ORAL_STRIP | Freq: Once | OPHTHALMIC | Status: AC
  Administered 2014-01-09: 1 via OPHTHALMIC
  Filled 2014-01-09: qty 1

## 2014-01-09 MED ORDER — TETRACAINE HCL 0.5 % OP SOLN
2.0000 [drp] | Freq: Once | OPHTHALMIC | Status: AC
  Administered 2014-01-09: 2 [drp] via OPHTHALMIC
  Filled 2014-01-09: qty 2

## 2014-01-09 NOTE — ED Provider Notes (Signed)
Medical screening examination/treatment/procedure(s) were performed by non-physician practitioner and as supervising physician I was immediately available for consultation/collaboration.   EKG Interpretation None        Fredia Sorrow, MD 01/09/14 [phone]

## 2014-01-09 NOTE — ED Provider Notes (Signed)
CSN: [id number]     Arrival date & time 01/09/14  0302 History   First MD Initiated Contact with Patient 01/09/14 [phone]     Chief Complaint  Patient presents with  . Eye Pain     (Consider location/radiation/quality/duration/timing/severity/associated sxs/prior Treatment) HPI  Patient to the ER with complaints of right eye pain that started yesterday evening. He started with some discomfort and pink to the eye, eventually took out his contact lenses and then over night his pain worsened overnight. Denies being hit in the head or eye recently. Denies having any vision loss. Has pain with light to direct light and accomodation. He endorses clear drainage. Denies history of eye disease or this happening. Denies being a diabetic.   Past Medical History  Diagnosis Date  . Hypertension   . Prediabetes     A1C 6.1% 08/2012  . Microcytosis     MCV 65  . Abnormal CT scan 08/2012    R axilla adenopathy  . LVH (left ventricular hypertrophy)     08/2712 echo-EF 55-60%, moderate LVH and mild LA dilatation.   . Abnormal platelets 09/26/12    Large platelets  . Abnormal RBC 09/26/12    Elliptocytes  . Hx of cardiovascular stress test     ETT-Myoview 7/14:  Normal, EF 54%, no ischemia  . Mild sleep apnea     a. sleep study 9/14:  mild OSA, AHI 10/hour; O2 desat nadir 88% => recommend weight loss   Past Surgical History  Procedure Laterality Date  . Hand surgery Right 2000    Right hand, 5years ago   Family History  Problem Relation Age of Onset  . Heart disease Mother    History  Substance Use Topics  . Smoking status: Never Smoker   . Smokeless tobacco: Not on file  . Alcohol Use: No    Review of Systems  Review of Systems  Gen: no weight loss, fevers, chills, night sweats  Eyes: + occular draining, occular pain,  No visual changes  Nose: no epistaxis or rhinorrhea  Mouth: no dental pain, no sore throat  Neck: no neck pain  Lungs: No hemoptysis. No wheezing or coughing CV:  No  palpitations, dependent edema or orthopnea. No chest pain Abd: no diarrhea. No nausea or vomiting, No abdominal pain  GU: no dysuria or gross hematuria  MSK:  No muscle weakness, No muscular pain Neuro: no headache, no focal neurologic deficits  Skin: no rash , no wounds Psyche: no complaints of depression or anxiety       Allergies  Bee venom and Other  Home Medications   Prior to Admission medications   Medication Sig Start Date End Date Taking? Authorizing Provider  hydrochlorothiazide (HYDRODIURIL) 25 MG tablet Take 1 tablet (25 mg total) by mouth daily. 06/03/13  Yes Lelon Perla, MD   BP 117/113  Pulse 69  Temp(Src) 98.4 F (36.9 C) (Oral)  Resp 16  SpO2 99% Physical Exam  Nursing note and vitals reviewed. Constitutional: He appears well-developed and well-nourished. No distress.  HENT:  Head: Normocephalic and atraumatic.  Eyes: EOM and lids are normal. Pupils are equal, round, and reactive to light. Right conjunctiva is injected. Right conjunctiva has no hemorrhage. Left conjunctiva is not injected. Left conjunctiva has no hemorrhage.  Slit lamp exam:      The right eye shows corneal abrasion and fluorescein uptake. The right eye shows no corneal flare, no corneal ulcer, no foreign body, no hyphema, no hypopyon and no  anterior chamber bulge.       The left eye shows no corneal abrasion, no corneal flare, no corneal ulcer, no foreign body, no hyphema, no hypopyon, no fluorescein uptake and no anterior chamber bulge.    Two significant areas of  Corneal abrasion to the right eye. Please see illustration for shape and location.  Neck: Normal range of motion. Neck supple.  Cardiovascular: Normal rate and regular rhythm.   Pulmonary/Chest: Effort normal.  Abdominal: Soft.  Neurological: He is alert.  Skin: Skin is warm and dry.    ED Course  Procedures (including critical care time) Labs Review Labs Reviewed - No data to display  Imaging Review No results  found.   EKG Interpretation None      MDM   Final diagnoses:  Corneal edema due to wearing of contact lenses, right    I spoke with Dr. Talbert Forest (Ophthalmology) regarding patient. He asks that I give a dose of abx in the ED and then he will see patient in 1 hour at this office 10 am. He is concerned for overuse due to contact wear. He has given me his cell phone number and address to have patient come and see him. I spoke with patient, he says he will go over to his office from the ER and does not have any questions or concerns about this.  33 y.o.Darren Little's evaluation in the Emergency Department is complete. It has been determined that no acute conditions requiring further emergency intervention are present at this time. The patient/guardian have been advised of the diagnosis and plan. We have discussed signs and symptoms that warrant return to the ED, such as changes or worsening in symptoms.  Vital signs are stable at discharge. Filed Vitals:   01/09/14 0734  BP: 117/113  Pulse: 69  Temp: 98.4 F (36.9 C)  Resp: 16    Patient/guardian has voiced understanding and agreed to follow-up with the PCP or specialist.     Linus Mako, PA-C 01/09/14 [phone]

## 2014-01-09 NOTE — ED Notes (Signed)
Pt instructed to call Cell phone forMD to open office door. Declined W/C at D/C and was escorted to lobby by RN.

## 2014-01-09 NOTE — ED Notes (Signed)
Pt. reports right eye pain with reddness / drainage onset yesterday , denies injury , no vision loss.

## 2014-06-06 ENCOUNTER — Encounter (HOSPITAL_COMMUNITY): Payer: Self-pay

## 2014-06-06 ENCOUNTER — Emergency Department (INDEPENDENT_AMBULATORY_CARE_PROVIDER_SITE_OTHER)
Admission: EM | Admit: 2014-06-06 | Discharge: 2014-06-06 | Discharge status: Against Medical Advice | Payer: Self-pay | Source: Home / Self Care | Attending: Family Medicine | Admitting: Family Medicine

## 2014-06-06 DIAGNOSIS — M25552 Pain in left hip: Secondary | ICD-10-CM

## 2014-06-06 LAB — POCT URINALYSIS DIP (DEVICE)
BILIRUBIN URINE: NEGATIVE
GLUCOSE, UA: NEGATIVE mg/dL
Hgb urine dipstick: NEGATIVE
KETONES UR: NEGATIVE mg/dL
Leukocytes, UA: NEGATIVE
Nitrite: NEGATIVE
PROTEIN: NEGATIVE mg/dL
Specific Gravity, Urine: 1.02 (ref 1.005–1.030)
Urobilinogen, UA: 0.2 mg/dL (ref 0.0–1.0)
pH: 5.5 (ref 5.0–8.0)

## 2014-06-06 NOTE — ED Provider Notes (Signed)
CSN: [id number]     Arrival date & time 06/06/14  1446 History   First MD Initiated Contact with Patient 06/06/14 [phone]     Chief Complaint  Patient presents with  . Groin Pain   (Consider location/radiation/quality/duration/timing/severity/associated sxs/prior Treatment) Patient is a 34 y.o. male presenting with groin pain. The history is provided by the patient.  Groin Pain This is a new problem. The current episode started yesterday. The problem has been gradually worsening. Pertinent negatives include no chest pain and no abdominal pain. Associated symptoms comments: Had urethral d/c 24mo ago resolved spont.. The symptoms are aggravated by walking. Nothing relieves the symptoms.    Past Medical History  Diagnosis Date  . Hypertension   . Prediabetes     A1C 6.1% 08/2012  . Microcytosis     MCV 65  . Abnormal CT scan 08/2012    R axilla adenopathy  . LVH (left ventricular hypertrophy)     08/2712 echo-EF 55-60%, moderate LVH and mild LA dilatation.   . Abnormal platelets 09/26/12    Large platelets  . Abnormal RBC 09/26/12    Elliptocytes  . Hx of cardiovascular stress test     ETT-Myoview 7/14:  Normal, EF 54%, no ischemia  . Mild sleep apnea     a. sleep study 9/14:  mild OSA, AHI 10/hour; O2 desat nadir 88% => recommend weight loss   Past Surgical History  Procedure Laterality Date  . Hand surgery Right 2000    Right hand, 5years ago   Family History  Problem Relation Age of Onset  . Heart disease Mother    History  Substance Use Topics  . Smoking status: Never Smoker   . Smokeless tobacco: Not on file  . Alcohol Use: No    Review of Systems  Constitutional: Negative.   Cardiovascular: Negative for chest pain.  Gastrointestinal: Negative.  Negative for abdominal pain.  Genitourinary: Positive for discharge and testicular pain. Negative for penile swelling and scrotal swelling.  Musculoskeletal: Positive for gait problem. Negative for back pain.  Skin: Negative.       Allergies  Bee venom and Other  Home Medications   Prior to Admission medications   Medication Sig Start Date End Date Taking? Authorizing Provider  hydrochlorothiazide (HYDRODIURIL) 25 MG tablet Take 1 tablet (25 mg total) by mouth daily. 06/03/13   Lelon Perla, MD   BP 208/110 mmHg  Pulse 81  Temp(Src) 99.3 F (37.4 C) (Oral)  Resp 18  SpO2 99% Physical Exam  Constitutional: He is oriented to person, place, and time. He appears well-developed and well-nourished. He appears distressed.  Abdominal: Soft. Bowel sounds are normal. Hernia confirmed negative in the right inguinal area and confirmed negative in the left inguinal area.  Genitourinary: Testes normal and penis normal. Cremasteric reflex is present. Left testis shows no swelling and no tenderness. Circumcised.  Musculoskeletal: He exhibits tenderness.       Left hip: He exhibits decreased range of motion and tenderness.       Legs: Lymphadenopathy:       Right: No inguinal adenopathy present.       Left: No inguinal adenopathy present.  Neurological: He is alert and oriented to person, place, and time.  Skin: Skin is warm and dry.  Nursing note and vitals reviewed.   ED Course  Procedures (including critical care time) Labs Review Labs Reviewed  POCT URINALYSIS DIP (DEVICE)    Imaging Review No results found.   MDM  1. Hip pain, acute, left    Sent for ortho eval of poss left hip synovitis/poss septic arthritis, poss gc related from urethral d/c 1 mo ago than resolved without rx.    Billy Fischer, MD 06/06/14 1640

## 2014-06-06 NOTE — ED Notes (Signed)
States he has pain in his left testicle which shoots down into his leg since last PM

## 2014-11-15 ENCOUNTER — Emergency Department (HOSPITAL_COMMUNITY): Payer: Self-pay

## 2014-11-15 ENCOUNTER — Emergency Department (HOSPITAL_COMMUNITY)
Admission: EM | Admit: 2014-11-15 | Discharge: 2014-11-15 | Disposition: A | Discharge status: Home / Self Care | Payer: Self-pay | Attending: Emergency Medicine | Admitting: Emergency Medicine

## 2014-11-15 ENCOUNTER — Encounter (HOSPITAL_COMMUNITY): Payer: Self-pay | Admitting: Emergency Medicine

## 2014-11-15 DIAGNOSIS — R51 Headache: Secondary | ICD-10-CM | POA: Insufficient documentation

## 2014-11-15 DIAGNOSIS — R079 Chest pain, unspecified: Secondary | ICD-10-CM | POA: Insufficient documentation

## 2014-11-15 DIAGNOSIS — R42 Dizziness and giddiness: Secondary | ICD-10-CM | POA: Insufficient documentation

## 2014-11-15 DIAGNOSIS — R200 Anesthesia of skin: Secondary | ICD-10-CM | POA: Insufficient documentation

## 2014-11-15 DIAGNOSIS — I10 Essential (primary) hypertension: Secondary | ICD-10-CM | POA: Insufficient documentation

## 2014-11-15 LAB — CBC
HEMATOCRIT: 37.2 % — AB (ref 39.0–52.0)
Hemoglobin: 12.3 g/dL — ABNORMAL LOW (ref 13.0–17.0)
MCH: 21.8 pg — ABNORMAL LOW (ref 26.0–34.0)
MCHC: 33.1 g/dL (ref 30.0–36.0)
MCV: 66 fL — AB (ref 78.0–100.0)
Platelets: 177 10*3/uL (ref 150–400)
RBC: 5.64 MIL/uL (ref 4.22–5.81)
RDW: 16.1 % — ABNORMAL HIGH (ref 11.5–15.5)
WBC: 4.7 10*3/uL (ref 4.0–10.5)

## 2014-11-15 LAB — BASIC METABOLIC PANEL
Anion gap: 10 (ref 5–15)
BUN: 13 mg/dL (ref 6–20)
CHLORIDE: 107 mmol/L (ref 101–111)
CO2: 23 mmol/L (ref 22–32)
Calcium: 8.9 mg/dL (ref 8.9–10.3)
Creatinine, Ser: 1.35 mg/dL — ABNORMAL HIGH (ref 0.61–1.24)
Glucose, Bld: 124 mg/dL — ABNORMAL HIGH (ref 65–99)
POTASSIUM: 3.8 mmol/L (ref 3.5–5.1)
SODIUM: 140 mmol/L (ref 135–145)

## 2014-11-15 LAB — I-STAT TROPONIN, ED
TROPONIN I, POC: 0.02 ng/mL (ref 0.00–0.08)
Troponin i, poc: 0.01 ng/mL (ref 0.00–0.08)

## 2014-11-15 MED ORDER — ASPIRIN 81 MG PO CHEW
324.0000 mg | CHEWABLE_TABLET | Freq: Once | ORAL | Status: AC
  Administered 2014-11-15: 324 mg via ORAL
  Filled 2014-11-15: qty 4

## 2014-11-15 MED ORDER — MORPHINE SULFATE 4 MG/ML IJ SOLN
4.0000 mg | Freq: Once | INTRAMUSCULAR | Status: AC
  Administered 2014-11-15: 4 mg via INTRAVENOUS
  Filled 2014-11-15: qty 1

## 2014-11-15 NOTE — ED Notes (Signed)
C/o intermittent pressure to R side of chest with mild sob since 10pm.  Denies nausea and vomiting.

## 2014-11-15 NOTE — ED Provider Notes (Signed)
CSN: [id number]     Arrival date & time 11/15/14  0309 History   First MD Initiated Contact with Patient 11/15/14 0522     Chief Complaint  Patient presents with  . Chest Pain     (Consider location/radiation/quality/duration/timing/severity/associated sxs/prior Treatment) HPI Comments: PT comes in with cc of chest pain. Chest pain is midsternal and slightly right sided and is fairly constant, sharp, and occasionally radiates to the back. No n/v/f/c. Pt had sweats associated with this and some dizziness, but denies shortness of breath. Pt has hx of HTN, prediabetes and LVH. He had a stress test in 2014 that was neg.  Additionally, pt also reports intermittent tingling to the L leg and LUE intermittently today, each episode lasting 5-10 min. No hx of CAD. Pt is not a smoker, no drug use. No hx of strokes.   ROS 10 Systems reviewed and are negative for acute change except as noted in the HPI.     Patient is a 34 y.o. male presenting with chest pain. The history is provided by the patient.  Chest Pain Associated symptoms: dizziness, headache and numbness   Associated symptoms: no abdominal pain, no cough and no shortness of breath     Past Medical History  Diagnosis Date  . Hypertension   . Prediabetes     A1C 6.1% 08/2012  . Microcytosis     MCV 65  . Abnormal CT scan 08/2012    R axilla adenopathy  . LVH (left ventricular hypertrophy)     08/2712 echo-EF 55-60%, moderate LVH and mild LA dilatation.   . Abnormal platelets 09/26/12    Large platelets  . Abnormal RBC 09/26/12    Elliptocytes  . Hx of cardiovascular stress test     ETT-Myoview 7/14:  Normal, EF 54%, no ischemia  . Mild sleep apnea     a. sleep study 9/14:  mild OSA, AHI 10/hour; O2 desat nadir 88% => recommend weight loss   Past Surgical History  Procedure Laterality Date  . Hand surgery Right 2000    Right hand, 5years ago   Family History  Problem Relation Age of Onset  . Heart disease Mother     Social History  Substance Use Topics  . Smoking status: Never Smoker   . Smokeless tobacco: None  . Alcohol Use: No    Review of Systems  Constitutional: Negative for activity change and appetite change.  Respiratory: Negative for cough and shortness of breath.   Cardiovascular: Positive for chest pain.  Gastrointestinal: Negative for abdominal pain.  Genitourinary: Negative for dysuria.  Neurological: Positive for dizziness, numbness and headaches.  All other systems reviewed and are negative.     Allergies  Bee venom and Other  Home Medications   Prior to Admission medications   Medication Sig Start Date End Date Taking? Authorizing Provider  hydrochlorothiazide (HYDRODIURIL) 25 MG tablet Take 1 tablet (25 mg total) by mouth daily. Patient not taking: Reported on 11/15/2014 06/03/13   Lelon Perla, MD   BP 152/90 mmHg  Pulse 63  Temp(Src) 98.5 F (36.9 C) (Oral)  Resp 15  SpO2 98% Physical Exam  Constitutional: He is oriented to person, place, and time. He appears well-developed.  HENT:  Head: Normocephalic and atraumatic.  Eyes: Conjunctivae and EOM are normal. Pupils are equal, round, and reactive to light.  Neck: Normal range of motion. Neck supple.  Cardiovascular: Normal rate, regular rhythm and normal heart sounds.   Pulmonary/Chest: Effort normal and breath sounds  normal. No respiratory distress. He has no wheezes.  Abdominal: Soft. Bowel sounds are normal. He exhibits no distension. There is no tenderness. There is no rebound and no guarding.  Musculoskeletal: He exhibits no edema.  Neurological: He is alert and oriented to person, place, and time. No cranial nerve deficit. Coordination normal.  Skin: Skin is warm.  Nursing note and vitals reviewed.   ED Course  Procedures (including critical care time) Labs Review Labs Reviewed  BASIC METABOLIC PANEL - Abnormal; Notable for the following:    Glucose, Bld 124 (*)    Creatinine, Ser 1.35 (*)     All other components within normal limits  CBC - Abnormal; Notable for the following:    Hemoglobin 12.3 (*)    HCT 37.2 (*)    MCV 66.0 (*)    MCH 21.8 (*)    RDW 16.1 (*)    All other components within normal limits  I-STAT TROPOININ, ED  Randolm Idol, ED    Imaging Review Dg Chest 2 View  11/15/2014   CLINICAL DATA:  Central chest pain beginning at 10 p.m. last night. History of hypertension.  EXAM: CHEST  2 VIEW  COMPARISON:  Chest radiograph July 09, 2013  FINDINGS: The cardiac silhouette appears upper limits normal in size, mediastinal silhouette is nonsuspicious. No pleural effusion or focal consolidation. No pneumothorax. Soft tissue planes and included osseous structure nonsuspicious.  IMPRESSION: Borderline cardiomegaly, no acute pulmonary process.   Electronically Signed   By: Elon Alas M.D.   On: 11/15/2014 04:03   I, Kathrynn Humble, Amnah Breuer, personally reviewed and evaluated these images and lab results as part of my medical decision-making.   EKG Interpretation   Date/Time:  Monday November 15 2014 03:15:15 EDT Ventricular Rate:  71 PR Interval:  168 QRS Duration: 86 QT Interval:  426 QTC Calculation: 462 R Axis:   61 Text Interpretation:  Normal sinus rhythm Possible Left atrial enlargement  Borderline ECG No significant change since last tracing Confirmed by  Erline Siddoway, MD, Ondra Deboard ([id number]) on 11/15/2014 5:24:45 AM        8:12 AM Currently chest pain free. 2ND TROP PENDING. Strict return precautions discussed. MDM   Final diagnoses:  Chest pain, unspecified chest pain type    Pt comes in with atypical chest pain. L sided pain with several non specific associated sx - tingling in legs, weakness in legs, tingling in arms bilaterally. Pain radiating to back at some point, but not currently. His vascular exam is normal. Pt's ekg has some non specific changes, but his inverted t waves have resolved in the lateral leads. Pt denies chest pains leading upto  today.  HEAR score is 2 - 1 for sx, 1 for risk factors, and 3 (if ekg is scored).  MPI IS AS BELOW:  Impression Exercise Capacity: Fair exercise capacity. BP Response: Normal blood pressure response. Clinical Symptoms: There is dyspnea. ECG Impression: No significant ST segment change suggestive of ischemia. Comparison with Prior Nuclear Study: No previous nuclear study performed  Overall Impression: Normal stress nuclear study.  LV Ejection Fraction: 54%. LV Wall Motion: NL LV Function; NL Wall Motion      Will ask pt to see Cards if trops x 2 neg, and return to the Er if symptoms get worse.       Varney Biles, MD 11/15/14 [phone]

## 2014-11-15 NOTE — ED Notes (Signed)
Nurse notified of BP 

## 2014-11-15 NOTE — Discharge Instructions (Signed)
We saw you in the ER for the chest pain/shortness of breath. All of our cardiac workup is normal, including labs, EKG and chest X-RAY are normal. We are not sure what is causing your discomfort, but we feel comfortable sending you home at this time. The workup in the ER is not complete, and you should follow up with your primary care doctor for further evaluation.   Chest Pain (Nonspecific) It is often hard to give a specific diagnosis for the cause of chest pain. There is always a chance that your pain could be related to something serious, such as a heart attack or a blood clot in the lungs. You need to follow up with your health care provider for further evaluation. CAUSES   Heartburn.  Pneumonia or bronchitis.  Anxiety or stress.  Inflammation around your heart (pericarditis) or lung (pleuritis or pleurisy).  A blood clot in the lung.  A collapsed lung (pneumothorax). It can develop suddenly on its own (spontaneous pneumothorax) or from trauma to the chest.  Shingles infection (herpes zoster virus). The chest wall is composed of bones, muscles, and cartilage. Any of these can be the source of the pain.  The bones can be bruised by injury.  The muscles or cartilage can be strained by coughing or overwork.  The cartilage can be affected by inflammation and become sore (costochondritis). DIAGNOSIS  Lab tests or other studies may be needed to find the cause of your pain. Your health care provider may have you take a test called an ambulatory electrocardiogram (ECG). An ECG records your heartbeat patterns over a 24-hour period. You may also have other tests, such as:  Transthoracic echocardiogram (TTE). During echocardiography, sound waves are used to evaluate how blood flows through your heart.  Transesophageal echocardiogram (TEE).  Cardiac monitoring. This allows your health care provider to monitor your heart rate and rhythm in real time.  Holter monitor. This is a portable  device that records your heartbeat and can help diagnose heart arrhythmias. It allows your health care provider to track your heart activity for several days, if needed.  Stress tests by exercise or by giving medicine that makes the heart beat faster. TREATMENT   Treatment depends on what may be causing your chest pain. Treatment may include:  Acid blockers for heartburn.  Anti-inflammatory medicine.  Pain medicine for inflammatory conditions.  Antibiotics if an infection is present.  You may be advised to change lifestyle habits. This includes stopping smoking and avoiding alcohol, caffeine, and chocolate.  You may be advised to keep your head raised (elevated) when sleeping. This reduces the chance of acid going backward from your stomach into your esophagus. Most of the time, nonspecific chest pain will improve within 2-3 days with rest and mild pain medicine.  HOME CARE INSTRUCTIONS   If antibiotics were prescribed, take them as directed. Finish them even if you start to feel better.  For the next few days, avoid physical activities that bring on chest pain. Continue physical activities as directed.  Do not use any tobacco products, including cigarettes, chewing tobacco, or electronic cigarettes.  Avoid drinking alcohol.  Only take medicine as directed by your health care provider.  Follow your health care provider's suggestions for further testing if your chest pain does not go away.  Keep any follow-up appointments you made. If you do not go to an appointment, you could develop lasting (chronic) problems with pain. If there is any problem keeping an appointment, call to reschedule. SEEK  MEDICAL CARE IF:   Your chest pain does not go away, even after treatment.  You have a rash with blisters on your chest.  You have a fever. SEEK IMMEDIATE MEDICAL CARE IF:   You have increased chest pain or pain that spreads to your arm, neck, jaw, back, or abdomen.  You have  shortness of breath.  You have an increasing cough, or you cough up blood.  You have severe back or abdominal pain.  You feel nauseous or vomit.  You have severe weakness.  You faint.  You have chills. This is an emergency. Do not wait to see if the pain will go away. Get medical help at once. Call your local emergency services (911 in [place] Do not drive yourself to the hospital. MAKE SURE YOU:   Understand these instructions.  Will watch your condition.  Will get help right away if you are not doing well or get worse. Document Released: 12/27/2004 Document Revised: 03/24/2013 Document Reviewed: 10/23/2007 [facility] Patient Information 2015 [place], [place]. This information is not intended to replace advice given to you by your health care provider. Make sure you discuss any questions you have with your health care provider.

## 2015-05-25 ENCOUNTER — Emergency Department (HOSPITAL_COMMUNITY)
Admission: EM | Admit: 2015-05-25 | Discharge: 2015-05-26 | Disposition: A | Discharge status: Home / Self Care | Payer: Self-pay | Attending: Emergency Medicine | Admitting: Emergency Medicine

## 2015-05-25 ENCOUNTER — Emergency Department (HOSPITAL_COMMUNITY): Payer: Self-pay

## 2015-05-25 ENCOUNTER — Encounter (HOSPITAL_COMMUNITY): Payer: Self-pay | Admitting: Emergency Medicine

## 2015-05-25 ENCOUNTER — Emergency Department (INDEPENDENT_AMBULATORY_CARE_PROVIDER_SITE_OTHER)
Admission: EM | Admit: 2015-05-25 | Discharge: 2015-05-25 | Disposition: A | Discharge status: Nursing Home (Includes ICF) | Payer: Self-pay | Source: Home / Self Care | Attending: Emergency Medicine | Admitting: Emergency Medicine

## 2015-05-25 ENCOUNTER — Encounter (HOSPITAL_COMMUNITY): Payer: Self-pay | Admitting: *Deleted

## 2015-05-25 DIAGNOSIS — G4730 Sleep apnea, unspecified: Secondary | ICD-10-CM | POA: Insufficient documentation

## 2015-05-25 DIAGNOSIS — R519 Headache, unspecified: Secondary | ICD-10-CM

## 2015-05-25 DIAGNOSIS — I10 Essential (primary) hypertension: Secondary | ICD-10-CM | POA: Insufficient documentation

## 2015-05-25 DIAGNOSIS — R9431 Abnormal electrocardiogram [ECG] [EKG]: Secondary | ICD-10-CM

## 2015-05-25 DIAGNOSIS — Z79899 Other long term (current) drug therapy: Secondary | ICD-10-CM | POA: Insufficient documentation

## 2015-05-25 DIAGNOSIS — Z862 Personal history of diseases of the blood and blood-forming organs and certain disorders involving the immune mechanism: Secondary | ICD-10-CM | POA: Insufficient documentation

## 2015-05-25 DIAGNOSIS — R079 Chest pain, unspecified: Secondary | ICD-10-CM

## 2015-05-25 DIAGNOSIS — I160 Hypertensive urgency: Secondary | ICD-10-CM

## 2015-05-25 DIAGNOSIS — Z9981 Dependence on supplemental oxygen: Secondary | ICD-10-CM | POA: Insufficient documentation

## 2015-05-25 DIAGNOSIS — R51 Headache: Secondary | ICD-10-CM

## 2015-05-25 LAB — CBC WITH DIFFERENTIAL/PLATELET
Basophils Absolute: 0 10*3/uL (ref 0.0–0.1)
Basophils Relative: 0 %
Eosinophils Absolute: 0.1 10*3/uL (ref 0.0–0.7)
Eosinophils Relative: 3 %
HCT: 38.5 % — ABNORMAL LOW (ref 39.0–52.0)
HEMOGLOBIN: 12.6 g/dL — AB (ref 13.0–17.0)
LYMPHS ABS: 2 10*3/uL (ref 0.7–4.0)
Lymphocytes Relative: 41 %
MCH: 21.8 pg — ABNORMAL LOW (ref 26.0–34.0)
MCHC: 32.7 g/dL (ref 30.0–36.0)
MCV: 66.6 fL — ABNORMAL LOW (ref 78.0–100.0)
MONO ABS: 0.6 10*3/uL (ref 0.1–1.0)
Monocytes Relative: 13 %
NEUTROS PCT: 43 %
Neutro Abs: 2.1 10*3/uL (ref 1.7–7.7)
Platelets: 217 10*3/uL (ref 150–400)
RBC: 5.78 MIL/uL (ref 4.22–5.81)
RDW: 16.3 % — AB (ref 11.5–15.5)
WBC: 4.8 10*3/uL (ref 4.0–10.5)

## 2015-05-25 LAB — BASIC METABOLIC PANEL
Anion gap: 10 (ref 5–15)
BUN: 14 mg/dL (ref 6–20)
CALCIUM: 8.6 mg/dL — AB (ref 8.9–10.3)
CO2: 23 mmol/L (ref 22–32)
CREATININE: 1.13 mg/dL (ref 0.61–1.24)
Chloride: 106 mmol/L (ref 101–111)
GFR calc non Af Amer: >60 mL/min (ref >60)
Glucose, Bld: 125 mg/dL — ABNORMAL HIGH (ref 65–99)
Potassium: 4.1 mmol/L (ref 3.5–5.1)
Sodium: 139 mmol/L (ref 135–145)

## 2015-05-25 LAB — I-STAT TROPONIN, ED: Troponin i, poc: 0.01 ng/mL (ref 0.00–0.08)

## 2015-05-25 MED ORDER — ASPIRIN 81 MG PO CHEW
CHEWABLE_TABLET | ORAL | Status: AC
  Filled 2015-05-25: qty 4

## 2015-05-25 MED ORDER — HYDRALAZINE HCL 20 MG/ML IJ SOLN
10.0000 mg | Freq: Once | INTRAMUSCULAR | Status: AC
  Administered 2015-05-25: 10 mg via INTRAVENOUS
  Filled 2015-05-25: qty 1

## 2015-05-25 MED ORDER — LISINOPRIL 10 MG PO TABS
10.0000 mg | ORAL_TABLET | Freq: Once | ORAL | Status: AC
  Administered 2015-05-25: 10 mg via ORAL
  Filled 2015-05-25: qty 1

## 2015-05-25 MED ORDER — ASPIRIN 81 MG PO CHEW
324.0000 mg | CHEWABLE_TABLET | Freq: Once | ORAL | Status: AC
  Administered 2015-05-25: 324 mg via ORAL

## 2015-05-25 NOTE — ED Notes (Signed)
Pt has a history of HBP and was on medications, HCTZ, Lisinopril, and Furosemide a year ago, but he stopped taking them.  Pt is here today for elevated BP and a headache.  He reports a tingly feeling in his left lower chest that comes and goes and lasts 3-4 seconds.  He will also get diaphoretic at times and SOB.

## 2015-05-25 NOTE — ED Provider Notes (Signed)
HPI  SUBJECTIVE:  Darren Little is a 35 y.o. male who presents with 2 weeks of migratory headaches with occasional blurry vision, left-sided chest pain that he describes his sharp, "poking" lasting several seconds with occasional radiation to his back.  no radiation of his neck or down his arm. He states that this is worse with exertion, better with rest. He reports occasional diaphoresis with this. He states that the pain is becoming more frequent and intense over the past few weeks. He denies nausea, vomiting, fevers.Dysarthria, arm or leg weakness, facial droop, seizures, syncope. He denies abdominal pain, coughing, wheezing, shortness of breath, pain tearing through to his back. Symptoms were better with aspirin, worse with exertion and stress. He has tried Aleve, Tylenol, baby aspirin. Past medical history of hypertension, stopped his medications 1.5 years ago, admitted for hypertensive emergency in 2014. He states that his symptoms today are similar to when he was admitted to the hospital. He has borderline diabetes, cardiomegaly, anemia. No history of MI. Family history significant for mother with with stroke at 30. No family history of early MI.   Past Medical History  Diagnosis Date  . Hypertension   . Prediabetes     A1C 6.1% 08/2012  . Microcytosis     MCV 65  . Abnormal CT scan 08/2012    R axilla adenopathy  . LVH (left ventricular hypertrophy)     08/2712 echo-EF 55-60%, moderate LVH and mild LA dilatation.   . Abnormal platelets ([place]) 09/26/12    Large platelets  . Abnormal RBC 09/26/12    Elliptocytes  . Hx of cardiovascular stress test     ETT-Myoview 7/14:  Normal, EF 54%, no ischemia  . Mild sleep apnea     a. sleep study 9/14:  mild OSA, AHI 10/hour; O2 desat nadir 88% => recommend weight loss    Past Surgical History  Procedure Laterality Date  . Hand surgery Right 2000    Right hand, 5years ago    Family History  Problem Relation Age of Onset  . Heart  disease Mother     Social History  Substance Use Topics  . Smoking status: Never Smoker   . Smokeless tobacco: None  . Alcohol Use: No     Current facility-administered medications:  .  aspirin chewable tablet 324 mg, 324 mg, Oral, Once, Melynda Ripple, MD  Current outpatient prescriptions:  .  hydrochlorothiazide (HYDRODIURIL) 25 MG tablet, Take 1 tablet (25 mg total) by mouth daily. (Patient not taking: Reported on 11/15/2014), Disp: 30 tablet, Rfl: 6  Allergies  Allergen Reactions  . Bee Venom Swelling  . Other Other (See Comments)    Squash- hives     ROS  As noted in HPI.   Physical Exam  BP 192/122 mmHg  Pulse 81  Temp(Src) 98.1 F (36.7 C) (Oral)  Resp 18  SpO2 97%  Constitutional: Well developed, well nourished, no acute distress Eyes: PERRL, EOMI, conjunctiva normal bilaterally HENT: Normocephalic, atraumatic,mucus membranes moist Respiratory: Clear to auscultation bilaterally, no rales, no wheezing, no rhonchi Cardiovascular: Normal rate and rhythm, no murmurs, no gallops, no rubs GI: Soft, nondistended, normal bowel sounds, nontender, no rebound, no guarding skin: No rash, skin intact Musculoskeletal: No edema, no tenderness, no deformities Neurologic: Alert & oriented x 3, CN II-XII grossly intact, no motor deficits, sensation grossly intact Psychiatric: Speech and behavior appropriate   ED Course   Medications  aspirin chewable tablet 324 mg (not administered)    Orders Placed This  Encounter  Procedures  . Keep Oxygen Setup At Bedside    Standing Status: Standing     Number of Occurrences: 1     Standing Expiration Date:   . Cardiac monitoring    Standing Status: Standing     Number of Occurrences: 1     Standing Expiration Date:   . EKG 12-Lead    Standing Status: Standing     Number of Occurrences: 1     Standing Expiration Date:   . ED EKG    Standing Status: Standing     Number of Occurrences: 1     Standing Expiration Date:      Order Specific Question:  Reason for Exam    Answer:  Chest Pain  . Insert peripheral IV    Standing Status: Standing     Number of Occurrences: 1     Standing Expiration Date:    No results found for this or any previous visit (from the past 24 hour(s)). No results found.  ED Clinical Impression  Hypertensive urgency  Nonintractable headache, unspecified chronicity pattern, unspecified headache type  Chest pain, unspecified chest pain type  Abnormal EKG   ED Assessment/Plan  EKG: Normal sinus rhythm, rate 73. Normal axis. Slightly prolonged QT. T wave inversion inferiorly in 2, 3, aVF, and laterally V5, V6, which is new compared to EKG from August 2016. No ST elevation.   Giving aspirin. Transferring to ED via EMS for hypertensive urgency/emergency with the chest pain and the EKG changes. No evidence of stroke of STEMI at this time.  Discussed rationale of transfer with patient. Patient agrees with plan.  *This clinic note was created using Dragon dictation software. Therefore, there may be occasional mistakes despite careful proofreading.  ?   Melynda Ripple, MD 05/25/15 2038

## 2015-05-25 NOTE — ED Notes (Signed)
Patient stated he went to the [facility] today to see his doctor and get his meds refilled.  He was told that his MD no longer works there and he needs to see another MD and that he could not get an appointment until the end of March to see them.  They told him to go to Urgent care to be seen.  He went to the Urgent Care and they ran a EKG and noticed inverted T waves.  His BP was also elevated.  States he has been having headaches since his present has been elevated.  He needs to get his BP down by the middle of March to be hired and have insurance.

## 2015-05-25 NOTE — ED Notes (Signed)
Pt to ED by EMS from [facility] for hypertension, has not taken blood pressure medications for a year and a half. Pt reported to have inverted t waves. C/o headache

## 2015-05-25 NOTE — ED Notes (Addendum)
Report called to [name] in the ED.  EMS called for transport.  Pt is being transferred for hypertensive crisis and EKG changes.

## 2015-05-26 MED ORDER — LISINOPRIL-HYDROCHLOROTHIAZIDE 20-25 MG PO TABS: 1.0000 | ORAL_TABLET | Freq: Every day | ORAL | Status: DC

## 2015-05-26 MED ORDER — HYDRALAZINE HCL 20 MG/ML IJ SOLN
20.0000 mg | Freq: Once | INTRAMUSCULAR | Status: AC
  Administered 2015-05-26: 20 mg via INTRAVENOUS
  Filled 2015-05-26: qty 1

## 2015-05-26 NOTE — ED Notes (Signed)
Pt stable, ambulatory, states understanding of discharge instructions 

## 2015-05-26 NOTE — ED Provider Notes (Signed)
CSN: [id number]     Arrival date & time 05/25/15  2113 History   First MD Initiated Contact with Patient 05/25/15 2128     Chief Complaint  Patient presents with  . Hypertension      HPI  Patient presents for evaluation of hypertension. [place] [facility] today. Has not been on blood pressure medicines for some time. Was seen in urgent care. EKG shows an inverted T waves and was referred here with hypertension greater than 190/100 and abnormal EKG. He has no chest pain. Her moderate headache for several weeks. No peripheral edema. Normal urine.  Past Medical History  Diagnosis Date  . Hypertension   . Prediabetes     A1C 6.1% 08/2012  . Microcytosis     MCV 65  . Abnormal CT scan 08/2012    R axilla adenopathy  . LVH (left ventricular hypertrophy)     08/2712 echo-EF 55-60%, moderate LVH and mild LA dilatation.   . Abnormal platelets ([place]) 09/26/12    Large platelets  . Abnormal RBC 09/26/12    Elliptocytes  . Hx of cardiovascular stress test     ETT-Myoview 7/14:  Normal, EF 54%, no ischemia  . Mild sleep apnea     a. sleep study 9/14:  mild OSA, AHI 10/hour; O2 desat nadir 88% => recommend weight loss   Past Surgical History  Procedure Laterality Date  . Hand surgery Right 2000    Right hand, 5years ago   Family History  Problem Relation Age of Onset  . Heart disease Mother    Social History  Substance Use Topics  . Smoking status: Never Smoker   . Smokeless tobacco: None  . Alcohol Use: No    Review of Systems  Constitutional: Negative for fever, chills, diaphoresis, appetite change and fatigue.  HENT: Negative for mouth sores, sore throat and trouble swallowing.   Eyes: Negative for visual disturbance.  Respiratory: Negative for cough, chest tightness, shortness of breath and wheezing.   Cardiovascular: Negative for chest pain.  Gastrointestinal: Negative for nausea, vomiting, abdominal pain, diarrhea and abdominal distention.  Endocrine: Negative for polydipsia,  polyphagia and polyuria.  Genitourinary: Negative for dysuria, frequency and hematuria.  Musculoskeletal: Negative for gait problem.  Skin: Negative for color change, pallor and rash.  Neurological: Positive for headaches. Negative for dizziness, syncope and light-headedness.  Hematological: Does not bruise/bleed easily.  Psychiatric/Behavioral: Negative for behavioral problems and confusion.      Allergies  Bee venom and Other  Home Medications   Prior to Admission medications   Medication Sig Start Date End Date Taking? Authorizing Provider  hydrochlorothiazide (HYDRODIURIL) 25 MG tablet Take 1 tablet (25 mg total) by mouth daily. Patient not taking: Reported on 11/15/2014 06/03/13   Lelon Perla, MD  lisinopril-hydrochlorothiazide (PRINZIDE,ZESTORETIC) 20-25 MG tablet Take 1 tablet by mouth daily. 05/26/15   Tanna Furry, MD   BP 154/96 mmHg  Pulse 74  Temp(Src) 98.4 F (36.9 C) (Oral)  Resp 17  SpO2 99% Physical Exam  Constitutional: He is oriented to person, place, and time. He appears well-developed and well-nourished. No distress.  HENT:  Head: Normocephalic.  Eyes: Conjunctivae are normal. Pupils are equal, round, and reactive to light. No scleral icterus.  Neck: Normal range of motion. Neck supple. No thyromegaly present.  Cardiovascular: Normal rate and regular rhythm.  Exam reveals no gallop and no friction rub.   No murmur heard. Pulmonary/Chest: Effort normal and breath sounds normal. No respiratory distress. He has no wheezes. He  has no rales.  Abdominal: Soft. Bowel sounds are normal. He exhibits no distension. There is no tenderness. There is no rebound.  Musculoskeletal: Normal range of motion.  Neurological: He is alert and oriented to person, place, and time.  Skin: Skin is warm and dry. No rash noted.  Psychiatric: He has a normal mood and affect. His behavior is normal.    ED Course  Procedures (including critical care time) Labs Review Labs Reviewed   CBC WITH DIFFERENTIAL/PLATELET - Abnormal; Notable for the following:    Hemoglobin 12.6 (*)    HCT 38.5 (*)    MCV 66.6 (*)    MCH 21.8 (*)    RDW 16.3 (*)    All other components within normal limits  BASIC METABOLIC PANEL - Abnormal; Notable for the following:    Glucose, Bld 125 (*)    Calcium 8.6 (*)    All other components within normal limits  I-STAT TROPOININ, ED    Imaging Review Ct Head Wo Contrast  05/26/2015  CLINICAL DATA:  Progressive headache.  Hypertension. EXAM: CT HEAD WITHOUT CONTRAST TECHNIQUE: Contiguous axial images were obtained from the base of the skull through the vertex without intravenous contrast. COMPARISON:  Head CT 07/09/2013 FINDINGS: No intracranial hemorrhage, mass effect, or midline shift. No hydrocephalus. The basilar cisterns are patent. No evidence of territorial infarct. No intracranial fluid collection. Calvarium is intact. Included paranasal sinuses and mastoid air cells are well aerated. IMPRESSION: No acute intracranial abnormality. Electronically Signed   By: Jeb Levering M.D.   On: 05/26/2015 00:18   I have personally reviewed and evaluated these images and lab results as part of my medical decision-making.   EKG Interpretation None      MDM   Final diagnoses:  Essential hypertension    Pressure improved with IV meds. CT without acute findings. Has LVH on EKG. Likely chronic hypertension. Renal function preserved. Blood pressure has improved. Appropriate for discharge home. Refill on lisinopril/head or thiazide. Follow-up. Recommend stop smoking. Moderate exercise. Sore section, diet exercise weight loss    Tanna Furry, MD 05/26/15 [phone]

## 2015-05-26 NOTE — Discharge Instructions (Signed)
Hypertension Hypertension, commonly called high blood pressure, is when the force of blood pumping through your arteries is too strong. Your arteries are the blood vessels that carry blood from your heart throughout your body. A blood pressure reading consists of a higher number over a lower number, such as 110/72. The higher number (systolic) is the pressure inside your arteries when your heart pumps. The lower number (diastolic) is the pressure inside your arteries when your heart relaxes. Ideally you want your blood pressure below 120/80. Hypertension forces your heart to work harder to pump blood. Your arteries may become narrow or stiff. Having untreated or uncontrolled hypertension can cause heart attack, stroke, kidney disease, and other problems. RISK FACTORS Some risk factors for high blood pressure are controllable. Others are not.  Risk factors you cannot control include:   Race. You may be at higher risk if you are African American.  Age. Risk increases with age.  Gender. Men are at higher risk than women before age 45 years. After age 65, women are at higher risk than men. Risk factors you can control include:  Not getting enough exercise or physical activity.  Being overweight.  Getting too much fat, sugar, calories, or salt in your diet.  Drinking too much alcohol. SIGNS AND SYMPTOMS Hypertension does not usually cause signs or symptoms. Extremely high blood pressure (hypertensive crisis) may cause headache, anxiety, shortness of breath, and nosebleed. DIAGNOSIS To check if you have hypertension, your health care provider will measure your blood pressure while you are seated, with your arm held at the level of your heart. It should be measured at least twice using the same arm. Certain conditions can cause a difference in blood pressure between your right and left arms. A blood pressure reading that is higher than normal on one occasion does not mean that you need treatment. If  it is not clear whether you have high blood pressure, you may be asked to return on a different day to have your blood pressure checked again. Or, you may be asked to monitor your blood pressure at home for 1 or more weeks. TREATMENT Treating high blood pressure includes making lifestyle changes and possibly taking medicine. Living a healthy lifestyle can help lower high blood pressure. You may need to change some of your habits. Lifestyle changes may include:  Following the DASH diet. This diet is high in fruits, vegetables, and whole grains. It is low in salt, red meat, and added sugars.  Keep your sodium intake below 2,300 mg per day.  Getting at least 30-45 minutes of aerobic exercise at least 4 times per week.  Losing weight if necessary.  Not smoking.  Limiting alcoholic beverages.  Learning ways to reduce stress. Your health care provider may prescribe medicine if lifestyle changes are not enough to get your blood pressure under control, and if one of the following is true:  You are 18-59 years of age and your systolic blood pressure is above 140.  You are 60 years of age or older, and your systolic blood pressure is above 150.  Your diastolic blood pressure is above 90.  You have diabetes, and your systolic blood pressure is over 140 or your diastolic blood pressure is over 90.  You have kidney disease and your blood pressure is above 140/90.  You have heart disease and your blood pressure is above 140/90. Your personal target blood pressure may vary depending on your medical conditions, your age, and other factors. HOME CARE INSTRUCTIONS    Have your blood pressure rechecked as directed by your health care provider.   Take medicines only as directed by your health care provider. Follow the directions carefully. Blood pressure medicines must be taken as prescribed. The medicine does not work as well when you skip doses. Skipping doses also puts you at risk for  problems.  Do not smoke.   Monitor your blood pressure at home as directed by your health care provider. SEEK MEDICAL CARE IF:   You think you are having a reaction to medicines taken.  You have recurrent headaches or feel dizzy.  You have swelling in your ankles.  You have trouble with your vision. SEEK IMMEDIATE MEDICAL CARE IF:  You develop a severe headache or confusion.  You have unusual weakness, numbness, or feel faint.  You have severe chest or abdominal pain.  You vomit repeatedly.  You have trouble breathing. MAKE SURE YOU:   Understand these instructions.  Will watch your condition.  Will get help right away if you are not doing well or get worse.   This information is not intended to replace advice given to you by your health care provider. Make sure you discuss any questions you have with your health care provider.   Document Released: 03/19/2005 Document Revised: 08/03/2014 Document Reviewed: 01/09/2013 [employer] Interactive Patient Education 2016 [employer]  

## 2015-06-16 NOTE — Progress Notes (Signed)
      HPI: FU hypertension. Echocardiogram 09/26/12: Moderate LVH, EF 0000000, normal diastolic function, mild LAE. ETT Myoview 7/14 demonstrated EF 54; no ischemia.Patient's blood pressure had been elevated previously but he was not taking his medications. 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 Since last seen, the patient denies any dyspnea on exertion, orthopnea, PND, pedal edema, palpitations, syncope or chest pain.   Current Outpatient Prescriptions  Medication Sig Dispense Refill  . hydrochlorothiazide (HYDRODIURIL) 25 MG tablet Take 1 tablet (25 mg total) by mouth daily. (Patient not taking: Reported on 11/15/2014) 30 tablet 6  . lisinopril-hydrochlorothiazide (PRINZIDE,ZESTORETIC) 20-25 MG tablet Take 1 tablet by mouth daily. 30 tablet 1   No current facility-administered medications for this visit.     Past Medical History  Diagnosis Date  . Hypertension   . Prediabetes     A1C 6.1% 08/2012  . Microcytosis     MCV 65  . Abnormal CT scan 08/2012    R axilla adenopathy  . LVH (left ventricular hypertrophy)     08/2712 echo-EF 55-60%, moderate LVH and mild LA dilatation.   . Abnormal platelets ([place]) 09/26/12    Large platelets  . Abnormal RBC 09/26/12    Elliptocytes  . Hx of cardiovascular stress test     ETT-Myoview 7/14:  Normal, EF 54%, no ischemia  . Mild sleep apnea     a. sleep study 9/14:  mild OSA, AHI 10/hour; O2 desat nadir 88% => recommend weight loss    Past Surgical History  Procedure Laterality Date  . Hand surgery Right 2000    Right hand, 5years ago    Social History   Social History  . Marital Status: Single    Spouse Name: N/A  . Number of Children: N/A  . Years of Education: N/A   Occupational History  . Not on file.   Social History Main Topics  . Smoking status: Never Smoker   . Smokeless tobacco: Not on file  . Alcohol Use: No  . Drug Use: No  . Sexual Activity: Not on file   Other Topics Concern  . Not on file   Social History  Narrative    Family History  Problem Relation Age of Onset  . Heart disease Mother     ROS: no fevers or chills, productive cough, hemoptysis, dysphasia, odynophagia, melena, hematochezia, dysuria, hematuria, rash, seizure activity, orthopnea, PND, pedal edema, claudication. Remaining systems are negative.  Physical Exam: Well-developed well-nourished in no acute distress.  Skin is warm and dry.  HEENT is normal.  Neck is supple.  Chest is clear to auscultation with normal expansion.  Cardiovascular exam is regular rate and rhythm.  Abdominal exam nontender or distended. No masses palpated. Extremities show no edema. neuro grossly intact  ECG 05/25/2015-sinus rhythm, left ventricular hypertrophy with repolarization abnormality, prolonged QT interval.

## 2015-06-21 ENCOUNTER — Ambulatory Visit (INDEPENDENT_AMBULATORY_CARE_PROVIDER_SITE_OTHER): Payer: Self-pay | Admitting: Cardiology

## 2015-06-21 ENCOUNTER — Encounter: Payer: Self-pay | Admitting: *Deleted

## 2015-06-21 ENCOUNTER — Encounter: Payer: Self-pay | Admitting: Cardiology

## 2015-06-21 VITALS — BP 140/88 | HR 76 | Ht 73.0 in | Wt 265.0 lb

## 2015-06-21 DIAGNOSIS — R718 Other abnormality of red blood cells: Secondary | ICD-10-CM

## 2015-06-21 DIAGNOSIS — I10 Essential (primary) hypertension: Secondary | ICD-10-CM

## 2015-06-21 MED ORDER — HYDROCHLOROTHIAZIDE 12.5 MG PO TABS: 12.5000 mg | ORAL_TABLET | Freq: Every day | ORAL | Status: DC

## 2015-06-21 MED ORDER — LISINOPRIL 40 MG PO TABS: 40.0000 mg | ORAL_TABLET | Freq: Every day | ORAL | Status: DC

## 2015-06-21 NOTE — Patient Instructions (Signed)
Medication Instructions:   STOP LISINOPRIL-HCTZ  START HCTZ 12.5 MG ONCE DAILY  START LISINOPRIL 40 MG ONCE DAILY  Labwork:  Your physician recommends that you return for lab work in: [place]:  Your physician recommends that you schedule a follow-up appointment in: [place]

## 2015-06-21 NOTE — Assessment & Plan Note (Signed)
Patient's blood pressure had been elevated previously but he was not taking his medications. He is now and it is improved but upper normal. We discussed the importance of compliance with medications. I did discuss low-sodium diet. He is exercising as well. We will treat with lisinopril 40 mg daily. HCTZ 12.5 mg daily. Check potassium and renal function in 1 week.

## 2015-06-21 NOTE — Assessment & Plan Note (Signed)
Patient with history of microcytic anemia. Patient had previous evaluation but did not follow-up. We will arrange a primary care physician.

## 2015-07-14 ENCOUNTER — Telehealth: Payer: Self-pay | Admitting: Cardiology

## 2015-07-14 ENCOUNTER — Telehealth: Payer: Self-pay | Admitting: *Deleted

## 2015-07-14 NOTE — Telephone Encounter (Signed)
Patient in office and blood pressure extremely elevated He is asymptomatic  Anderson Malta NP stated that his blood pressure have been running with systolic upper [id number] and diastolic above [id number] Scheduled patient on flex schedule for tomorrow [place]

## 2015-07-14 NOTE — Telephone Encounter (Signed)
Returned call to pt. He is working on clearance for DOT. He is needing the any recent documentation by Dr Stanford Breed.  He stated every time he goes to the DOT for BP check his BP is elevated above their guidelines. Stated he ate a [employer] hamburger 3 hours prior to last check with DOT. Educated pt to eat a low sodium diet to keep healthy BP and heart.  They want documentation that he has been seen by cardiologist.   Pt aware he needs to get f/u labs as ordered by Dr Stanford Breed at last visit, states he will do that today.  Paperwork with requested by pt left in envelope at front desk for pt to pick up.  Pt aware of his next office visit.

## 2015-07-14 NOTE — Telephone Encounter (Signed)
New message     [facility] calling - clarification of medication, chart  - pt in the office now

## 2015-07-15 ENCOUNTER — Encounter: Payer: Self-pay | Admitting: Physician Assistant

## 2015-07-15 ENCOUNTER — Ambulatory Visit (INDEPENDENT_AMBULATORY_CARE_PROVIDER_SITE_OTHER): Payer: Self-pay | Admitting: Physician Assistant

## 2015-07-15 VITALS — BP 152/110 | HR 70 | Ht 73.0 in | Wt 266.0 lb

## 2015-07-15 DIAGNOSIS — I10 Essential (primary) hypertension: Secondary | ICD-10-CM

## 2015-07-15 LAB — BASIC METABOLIC PANEL
BUN: 14 mg/dL (ref 7–25)
CHLORIDE: 103 mmol/L (ref 98–110)
CO2: 26 mmol/L (ref 20–31)
Calcium: 9 mg/dL (ref 8.6–10.3)
Creat: 1.14 mg/dL (ref 0.60–1.35)
GLUCOSE: 102 mg/dL — AB (ref 65–99)
POTASSIUM: 4 mmol/L (ref 3.5–5.3)
SODIUM: 139 mmol/L (ref 135–146)

## 2015-07-15 MED ORDER — HYDROCHLOROTHIAZIDE 12.5 MG PO TABS: 25.0000 mg | ORAL_TABLET | Freq: Every day | ORAL | Status: DC

## 2015-07-15 NOTE — Patient Instructions (Addendum)
Medication Instructions:  Your physician has recommended you make the following change in your medication:  INCREASE THE HCTZ TO 25 MG TAKING 1 TABLET DAILY (YOU CAN TAKE 2 12.5 UNTIL THEY ARE GONE)   Labwork: None ordered  Testing/Procedures: None ordered  Follow-Up: Your physician recommends that you schedule a follow-up appointment in:  4-8 [place] DR. CRENSHAW   Any Other Special Instructions Will Be Listed Below (If Applicable).   If you need a refill on your cardiac medications before your next appointment, please call your pharmacy.

## 2015-07-15 NOTE — Progress Notes (Signed)
Cardiology Office Note   Date:  07/15/2015   ID:  Darren Little, DOB 1981-03-19, MRN [id number]  PCP:  No PCP Per Patient  Cardiologist:  Dr. Stanford Breed    Chief Complaint  Patient presents with  . Follow-up    seen for Dr. Stanford Breed, for uncontrolled HTN      History of Present Illness: Darren Little is a 35 y.o. male who presents for cardiology follow-up. He has history of hypertension. Echocardiogram obtained on 09/26/2012 showed moderate LVH, EF 0000000, normal diastolic dysfunction. ETT Myoview on 09/2012 demonstrated EF 54%, no ischemia.  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 His blood pressure was 190s that day. According to the patient, he did eat a burger and some fries prior to the visit. However that does not explain why he is blood pressure is over 190s. He did have a sleep study in 2014 however was not placed on CPAP, he says he only have very mild OSA. He denies any prior bilateral renal ultrasound. He does say he usually does not get adequate rest at night. He continued to snore.      Past Medical History  Diagnosis Date  . Hypertension   . Prediabetes     A1C 6.1% 08/2012  . Microcytosis     MCV 65  . Abnormal CT scan 08/2012    R axilla adenopathy  . LVH (left ventricular hypertrophy)     08/2712 echo-EF 55-60%, moderate LVH and mild LA dilatation.   . Abnormal platelets ([place]) 09/26/12    Large platelets  . Abnormal RBC 09/26/12    Elliptocytes  . Hx of cardiovascular stress test     ETT-Myoview 7/14:  Normal, EF 54%, no ischemia  . Mild sleep apnea     a. sleep study 9/14:  mild OSA, AHI 10/hour; O2 desat nadir 88% => recommend weight loss    Past Surgical History  Procedure Laterality Date  . Hand surgery Right 2000    Right hand, 5years ago     Current Outpatient Prescriptions  Medication Sig Dispense Refill  . hydrochlorothiazide (HYDRODIURIL) 12.5 MG  tablet Take 2 tablets (25 mg total) by mouth daily. 90 tablet 3  . lisinopril (PRINIVIL,ZESTRIL) 40 MG tablet Take 1 tablet (40 mg total) by mouth daily. 90 tablet 3   No current facility-administered medications for this visit.    Allergies:   Bee venom and Other    Social History:  The patient  reports that he has never smoked. He does not have any smokeless tobacco history on file. He reports that he does not drink alcohol or use illicit drugs.   Family History:  The patient's family history includes Heart disease in his mother; Hypertension in his maternal grandfather and mother. There is no history of Heart attack.    ROS:  Please see the history of present illness.   Otherwise, review of systems are positive for uncontrolled HTN, otherwise asymptomatic.   All other systems are reviewed and negative.    PHYSICAL EXAM: VS:  BP 152/110 mmHg  Pulse 70  Ht 6\' 1"  (1.854 m)  Wt 266 lb (120.657 kg)  BMI 35.10 kg/m2  SpO2 97% , BMI Body mass index is 35.1 kg/(m^2). GEN: Well nourished, well developed, in no acute distress HEENT: normal Neck: no JVD, carotid bruits, or masses Cardiac: RRR; no murmurs, rubs, or gallops,no edema  Respiratory:  clear to auscultation bilaterally, normal work of breathing GI: soft, nontender, nondistended, + BS MS: no deformity or atrophy Skin: warm and dry, no rash Neuro:  Strength and sensation are intact Psych: euthymic mood, full affect   EKG:  EKG is not ordered today.   Recent Labs: 05/25/2015: Hemoglobin 12.6*; Platelets 217 07/14/2015: BUN 14; Creat 1.14; Potassium 4.0; Sodium 139    Lipid Panel No results found for: CHOL, TRIG, HDL, CHOLHDL, VLDL, LDLCALC, LDLDIRECT    Wt Readings from Last 3 Encounters:  07/15/15 266 lb (120.657 kg)  06/21/15 265 lb (120.203 kg)  11/28/12 259 lb 11.2 oz (117.799 kg)      Other studies Reviewed: Additional studies/ records that were reviewed today include:   Echo 09/26/2012 LV EF: 55% -   60%  ------------------------------------------------------------ Indications:   Hypertension - 401.0.  ------------------------------------------------------------ History:  PMH: Evaluate LVH and LV dysfunction. Risk factors: Hypertension.  ------------------------------------------------------------ Study Conclusions  - Left ventricle: The cavity size was normal. Wall thickness was increased in a pattern of moderate LVH. Systolic function was normal. The estimated ejection fraction was in the range of 55% to 60%. Left ventricular diastolic function parameters were normal. - Left atrium: The atrium was mildly dilated.     Negative myoview 10/24/2012   Review of the above records demonstrates:   Patient will have moderate LVH shown on previous echo and negative Myoview reason for evaluation of uncontrolled hypertension. He does have mild obstructive sleep apnea based on previous sleep studies in 2014.    ASSESSMENT AND PLAN:  1.  Uncontrolled HTN:  - His blood pressure today is 152/110. He says he has been compliant with his medication and take his medication at 8 AM every morning. I did increase his hydrochlorothiazide to 25 mg daily today. However the ultimate issue, I think is the fact that he has not been getting very good rest at night. Although his weight does play a factor. At some point he may need to repeat a sleep study. I am hesitant to give him any prescription drugs for sleep due to his young age. I have went over good sleep hygiene with him and recommended some over-the-counter melatonin.   - He will follow-up with Dr. Stanford Breed in 4-8 weeks, if at that time his blood pressure remained high despite increasing hydrochlorothiazide and good sleep hygiene, we need to consider potentially add hydralazine as a third medication. 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 Prediabetes: he will need a hgb A1C on next followup, last A1C 6.1 in 2014   Current medicines are reviewed at length with the patient today.  The patient does not have concerns regarding medicines.  The following changes have been made:  Increase HCTZ to 25mg  daily  Labs/ tests ordered today include:  No orders of the defined types were placed in this encounter.     Disposition:   FU with Dr. Stanford Breed in 1 months  Signed, Almyra Deforest, [place]  07/15/2015 10:04 PM    [facility] [place], [place]  [address] Phone: [phone]; Fax: [phone]

## 2015-08-24 NOTE — Progress Notes (Signed)
      HPI: FU hypertension. Echocardiogram 09/26/12: Moderate LVH, EF 0000000, normal diastolic function, mild LAE. ETT Myoview 7/14 demonstrated EF 54; no ischemia.Since last seen, the patient denies any dyspnea on exertion, orthopnea, PND, pedal edema, palpitations, syncope or chest pain. Patient states his systolic pressure is running [id number] and diastolic XX123456.   Current Outpatient Prescriptions  Medication Sig Dispense Refill  . hydrochlorothiazide (HYDRODIURIL) 12.5 MG tablet Take 2 tablets (25 mg total) by mouth daily. 90 tablet 3  . lisinopril (PRINIVIL,ZESTRIL) 40 MG tablet Take 1 tablet (40 mg total) by mouth daily. 90 tablet 3   No current facility-administered medications for this visit.     Past Medical History  Diagnosis Date  . Hypertension   . Prediabetes     A1C 6.1% 08/2012  . Microcytosis     MCV 65  . Abnormal CT scan 08/2012    R axilla adenopathy  . LVH (left ventricular hypertrophy)     08/2712 echo-EF 55-60%, moderate LVH and mild LA dilatation.   . Abnormal platelets ([place]) 09/26/12    Large platelets  . Abnormal RBC 09/26/12    Elliptocytes  . Hx of cardiovascular stress test     ETT-Myoview 7/14:  Normal, EF 54%, no ischemia  . Mild sleep apnea     a. sleep study 9/14:  mild OSA, AHI 10/hour; O2 desat nadir 88% => recommend weight loss    Past Surgical History  Procedure Laterality Date  . Hand surgery Right 2000    Right hand, 5years ago    Social History   Social History  . Marital Status: Single    Spouse Name: N/A  . Number of Children: N/A  . Years of Education: N/A   Occupational History  . Not on file.   Social History Main Topics  . Smoking status: Never Smoker   . Smokeless tobacco: Not on file  . Alcohol Use: No  . Drug Use: No  . Sexual Activity: Not on file   Other Topics Concern  . Not on file   Social History Narrative    Family History  Problem Relation Age of Onset  . Heart disease Mother   . Heart attack Neg Hx    . Hypertension Mother   . Hypertension Maternal Grandfather     ROS: no fevers or chills, productive cough, hemoptysis, dysphasia, odynophagia, melena, hematochezia, dysuria, hematuria, rash, seizure activity, orthopnea, PND, pedal edema, claudication. Remaining systems are negative.  Physical Exam: Well-developed well-nourished in no acute distress.  Skin is warm and dry.  HEENT is normal.  Neck is supple.  Chest is clear to auscultation with normal expansion.  Cardiovascular exam is regular rate and rhythm.  Abdominal exam nontender or distended. No masses palpated. Extremities show no edema. neuro grossly intact

## 2015-08-26 ENCOUNTER — Ambulatory Visit (INDEPENDENT_AMBULATORY_CARE_PROVIDER_SITE_OTHER): Payer: Self-pay | Admitting: Urgent Care

## 2015-08-26 ENCOUNTER — Encounter: Payer: Self-pay | Admitting: Cardiology

## 2015-08-26 ENCOUNTER — Ambulatory Visit (INDEPENDENT_AMBULATORY_CARE_PROVIDER_SITE_OTHER): Payer: Self-pay | Admitting: Cardiology

## 2015-08-26 ENCOUNTER — Encounter: Payer: Self-pay | Admitting: *Deleted

## 2015-08-26 VITALS — BP 138/88 | HR 78 | Ht 73.0 in | Wt 262.0 lb

## 2015-08-26 VITALS — BP 132/88 | HR 68 | Temp 97.7°F | Resp 18 | Ht 73.0 in | Wt 262.0 lb

## 2015-08-26 DIAGNOSIS — R718 Other abnormality of red blood cells: Secondary | ICD-10-CM

## 2015-08-26 DIAGNOSIS — I10 Essential (primary) hypertension: Secondary | ICD-10-CM

## 2015-08-26 DIAGNOSIS — Z021 Encounter for pre-employment examination: Secondary | ICD-10-CM

## 2015-08-26 DIAGNOSIS — Z024 Encounter for examination for driving license: Secondary | ICD-10-CM

## 2015-08-26 DIAGNOSIS — E669 Obesity, unspecified: Secondary | ICD-10-CM

## 2015-08-26 MED ORDER — HYDROCHLOROTHIAZIDE 12.5 MG PO TABS: 12.5000 mg | ORAL_TABLET | Freq: Every day | ORAL | Status: DC

## 2015-08-26 MED ORDER — AMLODIPINE BESYLATE 5 MG PO TABS: 5.0000 mg | ORAL_TABLET | Freq: Every day | ORAL | Status: DC

## 2015-08-26 NOTE — Assessment & Plan Note (Signed)
He has made significant lifestyle changes. He has lost 5 pounds and has changed his diet. We discussed this as well as exercise.

## 2015-08-26 NOTE — Assessment & Plan Note (Signed)
Follow-up primary care.

## 2015-08-26 NOTE — Patient Instructions (Addendum)
Keeping you healthy  Get these tests  Blood pressure- Have your blood pressure checked once a year by your healthcare provider.  Normal blood pressure is 120/80.  Weight- Have your body mass index (BMI) calculated to screen for obesity.  BMI is a measure of body fat based on height and weight. You can also calculate your own BMI at [employer]velBags.it.  Cholesterol- Have your cholesterol checked regularly starting at age 37, sooner may be necessary if you have diabetes, high blood pressure, if a family member developed heart diseases at an early age or if you smoke.   Chlamydia, HIV, and other sexual transmitted disease- Get screened each year until the age of 96 then within three months of each new sexual partner.  Diabetes- Have your blood sugar checked regularly if you have high blood pressure, high cholesterol, a family history of diabetes or if you are overweight.  Get these vaccines  Flu shot- Every fall.  Tetanus shot- Every 10 years.  Menactra- Single dose; prevents meningitis.  Take these steps  Don't smoke- If you do smoke, ask your healthcare provider about quitting. For tips on how to quit, go to www.smokefree.gov or call 1-800-QUIT-NOW.  Be physically active- Exercise 5 days a week for at least 30 minutes.  If you are not already physically active start slow and gradually work up to 30 minutes of moderate physical activity.  Examples of moderate activity include walking briskly, mowing the yard, dancing, swimming bicycling, etc.  Eat a healthy diet- Eat a variety of healthy foods such as fruits, vegetables, low fat milk, low fat cheese, yogurt, lean meats, poultry, fish, beans, tofu, etc.  For more information on healthy eating, go to www.thenutritionsource.org  Drink alcohol in moderation- Limit alcohol intake two drinks or less a day.  Never drink and drive.  Dentist- Brush and floss teeth twice daily; visit your dentis twice a year.  Depression-Your emotional  health is as important as your physical health.  If you're feeling down, losing interest in things you normally enjoy please talk with your healthcare provider.  Gun Safety- If you keep a gun in your home, keep it unloaded and with the safety lock on.  Bullets should be stored separately.  Helmet use- Always wear a helmet when riding a motorcycle, bicycle, rollerblading or skateboarding.  Safe sex- If you may be exposed to a sexually transmitted infection, use a condom  Seat belts- Seat bels can save your life; always wear one.  Smoke/Carbon Monoxide detectors- These detectors need to be installed on the appropriate level of your home.  Replace batteries at least once a year.  Skin Cancer- When out in the sun, cover up and use sunscreen SPF 15 or higher.  Violence- If anyone is threatening or hurting you, please tell your healthcare provider.    Hypertension Hypertension, commonly called high blood pressure, is when the force of blood pumping through your arteries is too strong. Your arteries are the blood vessels that carry blood from your heart throughout your body. A blood pressure reading consists of a higher number over a lower number, such as 110/72. The higher number (systolic) is the pressure inside your arteries when your heart pumps. The lower number (diastolic) is the pressure inside your arteries when your heart relaxes. Ideally you want your blood pressure below 120/80. Hypertension forces your heart to work harder to pump blood. Your arteries may become narrow or stiff. Having untreated or uncontrolled hypertension can cause heart attack, stroke, kidney disease, and  other problems. RISK FACTORS Some risk factors for high blood pressure are controllable. Others are not.  Risk factors you cannot control include:   Race. You may be at higher risk if you are African American.  Age. Risk increases with age.  Gender. Men are at higher risk than women before age 24 years. After  age 27, women are at higher risk than men. Risk factors you can control include:  Not getting enough exercise or physical activity.  Being overweight.  Getting too much fat, sugar, calories, or salt in your diet.  Drinking too much alcohol. SIGNS AND SYMPTOMS Hypertension does not usually cause signs or symptoms. Extremely high blood pressure (hypertensive crisis) may cause headache, anxiety, shortness of breath, and nosebleed. DIAGNOSIS To check if you have hypertension, your health care provider will measure your blood pressure while you are seated, with your arm held at the level of your heart. It should be measured at least twice using the same arm. Certain conditions can cause a difference in blood pressure between your right and left arms. A blood pressure reading that is higher than normal on one occasion does not mean that you need treatment. If it is not clear whether you have high blood pressure, you may be asked to return on a different day to have your blood pressure checked again. Or, you may be asked to monitor your blood pressure at home for 1 or more weeks. TREATMENT Treating high blood pressure includes making lifestyle changes and possibly taking medicine. Living a healthy lifestyle can help lower high blood pressure. You may need to change some of your habits. Lifestyle changes may include:  Following the DASH diet. This diet is high in fruits, vegetables, and whole grains. It is low in salt, red meat, and added sugars.  Keep your sodium intake below 2,300 mg per day.  Getting at least 30-45 minutes of aerobic exercise at least 4 times per week.  Losing weight if necessary.  Not smoking.  Limiting alcoholic beverages.  Learning ways to reduce stress. Your health care provider may prescribe medicine if lifestyle changes are not enough to get your blood pressure under control, and if one of the following is true:  You are 51-32 years of age and your systolic blood  pressure is above 140.  You are 34 years of age or older, and your systolic blood pressure is above 150.  Your diastolic blood pressure is above 90.  You have diabetes, and your systolic blood pressure is over XX123456 or your diastolic blood pressure is over 90.  You have kidney disease and your blood pressure is above 140/90.  You have heart disease and your blood pressure is above 140/90. Your personal target blood pressure may vary depending on your medical conditions, your age, and other factors. HOME CARE INSTRUCTIONS  Have your blood pressure rechecked as directed by your health care provider.   Take medicines only as directed by your health care provider. Follow the directions carefully. Blood pressure medicines must be taken as prescribed. The medicine does not work as well when you skip doses. Skipping doses also puts you at risk for problems.  Do not smoke.   Monitor your blood pressure at home as directed by your health care provider. SEEK MEDICAL CARE IF:   You think you are having a reaction to medicines taken.  You have recurrent headaches or feel dizzy.  You have swelling in your ankles.  You have trouble with your vision. SEEK IMMEDIATE  MEDICAL CARE IF:  You develop a severe headache or confusion.  You have unusual weakness, numbness, or feel faint.  You have severe chest or abdominal pain.  You vomit repeatedly.  You have trouble breathing. MAKE SURE YOU:   Understand these instructions.  Will watch your condition.  Will get help right away if you are not doing well or get worse.   This information is not intended to replace advice given to you by your health care provider. Make sure you discuss any questions you have with your health care provider.   Document Released: 03/19/2005 Document Revised: 08/03/2014 Document Reviewed: 01/09/2013 Elsevier Interactive Patient Education 2016 [employer].     IF you received an x-ray today, you will  receive an invoice from [facility]. Please contact [facility] at [phone] with questions or concerns regarding your invoice.   IF you received labwork today, you will receive an invoice from [employer]. Please contact [facility] at [phone] with questions or concerns regarding your invoice.   Our billing staff will not be able to assist you with questions regarding bills from these companies.  You will be contacted with the lab results as soon as they are available. The fastest way to get your results is to activate your My Chart account. Instructions are located on the last page of this paperwork. If you have not heard from [place] regarding the results in 2 weeks, please contact this office.

## 2015-08-26 NOTE — Assessment & Plan Note (Signed)
Blood pressure has improved but diastolic continues to be elevated at times. Decrease Hydrochlorothiazide to 12.5 mg daily. Check potassium and renal function in 1 week. Add amlodipine 5 mg daily. Follow blood pressure at home and adjust regimen as needed. He has made significant lifestyle changes.

## 2015-08-26 NOTE — Progress Notes (Signed)
Commercial Driver Medical Examination   Darren Little is a 35 y.o. male who presents today for a DOT physical exam. The patient reports history of HTN managed with 3 blood pressure medications with Dr. Stanford Breed. Admits good control of his high BP. Denies lightheadedness, dizziness, chronic headache, double vision, chest pain, shortness of breath, heart racing, palpitations, nausea, vomiting, abdominal pain, hematuria, lower leg swelling.   The following portions of the patient's history were reviewed and updated as appropriate: allergies, current medications, past family history, past medical history, past social history and past surgical history.  Objective:   BP 132/88 mmHg  Pulse 68  Temp(Src) 97.7 F (36.5 C) (Oral)  Resp 18  Ht 6\' 1"  (1.854 m)  Wt 262 lb (118.842 kg)  BMI 34.57 kg/m2  SpO2 99%  Vision/hearing:  Visual Acuity Screening   Right eye Left eye Both eyes  Without correction:     With correction: 20/30 20/30 20/30   Hearing Screening Comments: Peripheral Vision: Right eye 85 degrees. Left eye 85 degrees. The patient can distinguish the colors red, amber and green. The patient was able to hear a forced whisper from L=10 R=10 feet.  Applicant can recognize and distinguish among traffic control signals and devices showing standard red, green, and amber colors.  Corrective lenses required: Yes  Monocular Vision?: No  Hearing aid requirement: No  Physical Exam  Constitutional: He is oriented to person, place, and time. He appears well-developed and well-nourished.  HENT:  TM's intact bilaterally, no effusions or erythema. Nasal turbinates pink and moist, nasal passages patent. No sinus tenderness. Oropharynx clear, mucous membranes moist, dentition in good repair.  Eyes: Conjunctivae and EOM are normal. Pupils are equal, round, and reactive to light. Right eye exhibits no discharge. Left eye exhibits no discharge. No scleral icterus.  Neck: Normal range of  motion. Neck supple. No thyromegaly present.  Cardiovascular: Normal rate, regular rhythm and intact distal pulses.  Exam reveals no gallop and no friction rub.   No murmur heard. Pulmonary/Chest: No stridor. No respiratory distress. He has no wheezes. He has no rales.  Abdominal: Soft. Bowel sounds are normal. He exhibits no distension and no mass. There is no tenderness.  Musculoskeletal: Normal range of motion. He exhibits no edema or tenderness.  Lymphadenopathy:    He has no cervical adenopathy.  Neurological: He is alert and oriented to person, place, and time. He has normal reflexes.  Skin: Skin is warm and dry. No rash noted. No erythema. No pallor.  Psychiatric: He has a normal mood and affect.   Labs: Comments: spgr:1.015, GLU:neg,PRO:neg,Blood:neg  Assessment:    Healthy male exam.  Meets standards, but periodic monitoring required due to HTN.  Driver qualified only for 1 year.    Plan:    Medical examiners certificate completed and printed. Return as needed.

## 2015-08-26 NOTE — Patient Instructions (Signed)
Medication Instructions:   DECREASE HCTZ TO 12.5 MG ONCE DAILY= 1 OF THE 12.5 MG TABLETS ONCE DAILY  START AMLODIPINE 5 MG ONCE DAILY  Labwork:  Your physician recommends that you return for lab work in:ONE WEEK  Follow-Up:  Your physician recommends that you schedule a follow-up appointment in: [place]

## 2015-09-07 LAB — BASIC METABOLIC PANEL
BUN: 16 mg/dL (ref 7–25)
CO2: 25 mmol/L (ref 20–31)
CREATININE: 1.1 mg/dL (ref 0.60–1.35)
Calcium: 9.1 mg/dL (ref 8.6–10.3)
Chloride: 105 mmol/L (ref 98–110)
Glucose, Bld: 100 mg/dL — ABNORMAL HIGH (ref 65–99)
Potassium: 3.9 mmol/L (ref 3.5–5.3)
Sodium: 138 mmol/L (ref 135–146)

## 2015-09-21 ENCOUNTER — Encounter (HOSPITAL_COMMUNITY): Payer: Self-pay | Admitting: Emergency Medicine

## 2015-09-21 DIAGNOSIS — I10 Essential (primary) hypertension: Secondary | ICD-10-CM | POA: Insufficient documentation

## 2015-09-21 DIAGNOSIS — B350 Tinea barbae and tinea capitis: Secondary | ICD-10-CM | POA: Insufficient documentation

## 2015-09-21 NOTE — ED Notes (Signed)
Pt. reports worsening abscess at left scalp with swelling and drainage onset 2 weeks ago . denies fever or chills.

## 2015-09-22 ENCOUNTER — Emergency Department (HOSPITAL_COMMUNITY)
Admission: EM | Admit: 2015-09-22 | Discharge: 2015-09-22 | Disposition: A | Discharge status: Home / Self Care | Payer: Self-pay | Attending: Emergency Medicine | Admitting: Emergency Medicine

## 2015-09-22 DIAGNOSIS — B350 Tinea barbae and tinea capitis: Secondary | ICD-10-CM

## 2015-09-22 MED ORDER — HYDROCODONE-ACETAMINOPHEN 5-325 MG PO TABS
1.0000 | ORAL_TABLET | Freq: Once | ORAL | Status: AC
  Administered 2015-09-22: 1 via ORAL
  Filled 2015-09-22: qty 1

## 2015-09-22 MED ORDER — GRISEOFULVIN MICROSIZE 500 MG PO TABS: 500.0000 mg | ORAL_TABLET | Freq: Every day | ORAL | Status: DC

## 2015-09-22 NOTE — ED Notes (Signed)
Patient left at this time with all belongings, refused wheelchair. 

## 2015-09-22 NOTE — ED Notes (Signed)
MD at bedside. 

## 2015-09-22 NOTE — ED Provider Notes (Signed)
CSN: [id number]     Arrival date & time 09/21/15  2234 History   By signing my name below, I, Maud Deed. Royston Sinner, attest that this documentation has been prepared under the direction and in the presence of Merryl Hacker, MD.  Electronically Signed: Maud Deed. Royston Sinner, ED Scribe. 09/22/2015. 1:49 AM.   Chief Complaint  Patient presents with  . Abscess   The history is provided by the patient. No language interpreter was used.    HPI Comments: HASSELL BERNAL is a 35 y.o. male with a PMHx of HTN who presents to the Emergency Department here for a painful raised area to the back of the L scalp x 2 weeks. He also reports noted clear and intermittently bloody drainage from area. Discomfort to scalp is exacerbated with deep pressure. No alleviating factors. No OTC medications or home remedies attempted prior to arrival. No recent fever, chills, nausea, or vomiting. 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  PCP: No PCP Per Patient    Past Medical History  Diagnosis Date  . Hypertension   . Prediabetes     A1C 6.1% 08/2012  . Microcytosis     MCV 65  . Abnormal CT scan 08/2012    R axilla adenopathy  . LVH (left ventricular hypertrophy)     08/2712 echo-EF 55-60%, moderate LVH and mild LA dilatation.   . Abnormal platelets ([facility]) 09/26/12    Large platelets  . Abnormal RBC 09/26/12    Elliptocytes  . Hx of cardiovascular stress test     ETT-Myoview 7/14:  Normal, EF 54%, no ischemia  . Mild sleep apnea     a. sleep study 9/14:  mild OSA, AHI 10/hour; O2 desat nadir 88% => recommend weight loss   Past Surgical History  Procedure Laterality Date  . Hand surgery Right 2000    Right hand, 5years ago   Family History  Problem Relation Age of Onset  . Heart disease Mother   . Heart attack Neg Hx   . Hypertension Mother   . Hypertension Maternal Grandfather    Social History  Substance Use Topics  . Smoking status: Never Smoker   . Smokeless tobacco: None  . Alcohol Use: No    Review  of Systems  Constitutional: Negative for fever and chills.  Respiratory: Negative for shortness of breath.   Cardiovascular: Negative for chest pain.  Gastrointestinal: Negative for nausea and vomiting.  Skin: Positive for wound.  Neurological: Negative for headaches.  Psychiatric/Behavioral: Negative for confusion.  All other systems reviewed and are negative.     Allergies  Bee venom and Other  Home Medications   Prior to Admission medications   Medication Sig Start Date End Date Taking? Authorizing Provider  amLODipine (NORVASC) 5 MG tablet Take 1 tablet (5 mg total) by mouth daily. 08/26/15   Lelon Perla, MD  griseofulvin (GRIFULVIN V) 500 MG tablet Take 1 tablet (500 mg total) by mouth daily. 09/22/15   Merryl Hacker, MD  hydrochlorothiazide (HYDRODIURIL) 12.5 MG tablet Take 1 tablet (12.5 mg total) by mouth daily. 08/26/15   Lelon Perla, MD  lisinopril (PRINIVIL,ZESTRIL) 40 MG tablet Take 1 tablet (40 mg total) by mouth daily. 06/21/15   Lelon Perla, MD   Triage Vitals: BP 131/92 mmHg  Pulse 65  Temp(Src) 98.5 F (36.9 C) (Oral)  Resp 16  Ht 6' (1.829 m)  Wt 266 lb (120.657 kg)  BMI 36.07 kg/m2  SpO2 99%  Physical Exam  Constitutional: He is oriented to person, place, and time. He appears well-developed and well-nourished.  HENT:  Head: Normocephalic and atraumatic.  2 cm well-circumscribed area of alopecia and bogginess over the left posterior scalp, no drainage noted, no lymphadenopathy  Eyes: Pupils are equal, round, and reactive to light.  Cardiovascular: Normal rate and regular rhythm.   Pulmonary/Chest: Effort normal. No respiratory distress.  Musculoskeletal: Normal range of motion. He exhibits no edema.  Lymphadenopathy:    He has no cervical adenopathy.  Neurological: He is alert and oriented to person, place, and time.  Skin: Skin is warm and dry.  Psychiatric: He has a normal mood and affect.  Nursing note and vitals reviewed.   ED  Course  Procedures (including critical care time)  DIAGNOSTIC STUDIES: Oxygen Saturation is 99% on RA, Normal by my interpretation.    COORDINATION OF CARE: 1:48 AM-Discussed treatment plan with pt at bedside and pt agreed to plan.     Labs Review Labs Reviewed - No data to display  Imaging Review No results found. I have personally reviewed and evaluated these images and lab results as part of my medical decision-making.   EKG Interpretation None      MDM   Final diagnoses:  Tinea capitis    Patient presents with lesions in the scalp. Exam is concerning for tinea capitis. He has alopecia. No significant erythema or purulent drainage suggestive of abscess or infection. Will treat with griseofulvin and have him follow-up with his primary physician in one week for recheck.  After history, exam, and medical workup I feel the patient has been appropriately medically screened and is safe for discharge home. Pertinent diagnoses were discussed with the patient. Patient was given return precautions.  I personally performed the services described in this documentation, which was scribed in my presence. The recorded information has been reviewed and is accurate.   Merryl Hacker, MD 09/22/15 [phone]

## 2015-09-22 NOTE — Discharge Instructions (Signed)
You were seen today for a lesion of her scalp. It is concerning for ringworm. You'll be started on oral medication. You need to follow-up with her primary doctor in one week.   Scalp Ringworm Scalp ringworm (tinea capitis) is a fungal infection of the skin on the scalp. This condition is easily spread from person to person (contagious). It can also be spread from animals to humans. HOME CARE  Give or apply over-the-counter and prescription medicines only as told by your doctor. This may include giving medicine for up to 6-8 weeks to kill the fungus.  Check your household members and your pets, if this applies, for ringworm. Do this often to make sure they do not get the condition.  Do not let your child share:  Brushes.  Combs.  Barrettes.  Hats.  Towels.   Clean and disinfect all combs, brushes, and hats that your child wears or uses. Throw away any natural bristle brushes.  Do not give a short haircut or shave his or her head while he or she is being treated.  Keep all follow-up visits as told by your child's doctor. This is important. GET HELP IF:  Your  rash gets worse.  Your  rash spreads.  Your rash comes back after treatment is done.  Your  rash does not get better with treatment.  Your  has a fever.  Your rash is painful and medicine does not help the pain.  Your rash becomes red, warm, tender, and swollen. GET HELP RIGHT AWAY IF:  Your have yellowish-white fluid (pus) coming from the rash.   This information is not intended to replace advice given to you by your health care provider. Make sure you discuss any questions you have with your health care provider.   Document Released: 03/07/2009 Document Revised: 12/08/2014 Document Reviewed: 08/25/2014 Elsevier Interactive Patient Education [employer].

## 2015-09-29 ENCOUNTER — Ambulatory Visit: Payer: Self-pay | Admitting: Cardiology

## 2015-11-25 NOTE — Progress Notes (Deleted)
      HPI: FU hypertension. Echocardiogram 09/26/12: Moderate LVH, EF 0000000, normal diastolic function, mild LAE. ETT Myoview 7/14 demonstrated EF 54; no ischemia.Since last seen,   Current Outpatient Prescriptions  Medication Sig Dispense Refill  . amLODipine (NORVASC) 5 MG tablet Take 1 tablet (5 mg total) by mouth daily. 90 tablet 3  . griseofulvin (GRIFULVIN V) 500 MG tablet Take 1 tablet (500 mg total) by mouth daily. 30 tablet 0  . hydrochlorothiazide (HYDRODIURIL) 12.5 MG tablet Take 1 tablet (12.5 mg total) by mouth daily. 90 tablet 3  . lisinopril (PRINIVIL,ZESTRIL) 40 MG tablet Take 1 tablet (40 mg total) by mouth daily. 90 tablet 3   No current facility-administered medications for this visit.      Past Medical History:  Diagnosis Date  . Abnormal CT scan 08/2012   R axilla adenopathy  . 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 Abnormal RBC 09/26/12   Elliptocytes  . Hx of cardiovascular stress test    ETT-Myoview 7/14:  Normal, EF 54%, no ischemia  . Hypertension   . LVH (left ventricular hypertrophy)    08/2712 echo-EF 55-60%, moderate LVH and mild LA dilatation.   . Microcytosis    MCV 65  . Mild sleep apnea    a. sleep study 9/14:  mild OSA, AHI 10/hour; O2 desat nadir 88% => recommend weight loss  . Prediabetes    A1C 6.1% 08/2012    Past Surgical History:  Procedure Laterality Date  . HAND SURGERY Right 2000   Right hand, 5years ago    Social History   Social History  . Marital status: Single    Spouse name: N/A  . Number of children: N/A  . Years of education: N/A   Occupational History  . Not on file.   Social History Main Topics  . Smoking status: Never Smoker  . Smokeless tobacco: Not on file  . Alcohol use No  . Drug use: No  . Sexual activity: Not on file   Other Topics Concern  . Not on file   Social History Narrative  . No narrative on file    Family History  Problem Relation Age of Onset  . Heart disease Mother    . Hypertension Mother   . Hypertension Maternal Grandfather   . Heart attack Neg Hx     ROS: no fevers or chills, productive cough, hemoptysis, dysphasia, odynophagia, melena, hematochezia, dysuria, hematuria, rash, seizure activity, orthopnea, PND, pedal edema, claudication. Remaining systems are negative.  Physical Exam: Well-developed well-nourished in no acute distress.  Skin is warm and dry.  HEENT is normal.  Neck is supple.  Chest is clear to auscultation with normal expansion.  Cardiovascular exam is regular rate and rhythm.  Abdominal exam nontender or distended. No masses palpated. Extremities show no edema. neuro grossly intact  ECG

## 2015-11-28 ENCOUNTER — Ambulatory Visit: Payer: Self-pay | Admitting: Cardiology

## 2015-11-29 ENCOUNTER — Encounter: Payer: Self-pay | Admitting: *Deleted

## 2015-11-29 NOTE — Progress Notes (Signed)
Letter mailed

## 2016-03-24 ENCOUNTER — Other Ambulatory Visit: Payer: Self-pay | Admitting: Physician Assistant

## 2016-03-27 NOTE — Telephone Encounter (Signed)
Review for refill. 

## 2016-03-27 NOTE — Telephone Encounter (Signed)
REFILL 

## 2016-04-26 ENCOUNTER — Ambulatory Visit (INDEPENDENT_AMBULATORY_CARE_PROVIDER_SITE_OTHER): Payer: BLUE CROSS/BLUE SHIELD | Admitting: Cardiology

## 2016-04-26 ENCOUNTER — Encounter: Payer: Self-pay | Admitting: Cardiology

## 2016-04-26 VITALS — BP 164/107 | HR 82 | Ht 72.0 in | Wt 275.8 lb

## 2016-04-26 DIAGNOSIS — R072 Precordial pain: Secondary | ICD-10-CM | POA: Diagnosis not present

## 2016-04-26 DIAGNOSIS — I10 Essential (primary) hypertension: Secondary | ICD-10-CM | POA: Diagnosis not present

## 2016-04-26 MED ORDER — HYDROCHLOROTHIAZIDE 12.5 MG PO CAPS: 12.5000 mg | ORAL_CAPSULE | Freq: Every day | ORAL | 3 refills | Status: DC

## 2016-04-26 MED ORDER — AMLODIPINE BESYLATE 5 MG PO TABS: 5.0000 mg | ORAL_TABLET | Freq: Every day | ORAL | 3 refills | Status: DC

## 2016-04-26 MED ORDER — LISINOPRIL 40 MG PO TABS: 40.0000 mg | ORAL_TABLET | Freq: Every day | ORAL | 3 refills | Status: DC

## 2016-04-26 NOTE — Patient Instructions (Signed)
Medication Instructions:   RESTART HCTZ 12.5 MG ONCE DAILY  RESTART AMLODIPINE 5 MG ONCE DAILY  Testing/Procedures:  Your physician has requested that you have a stress echocardiogram. For further information please visit [email or web address]. Please follow instruction sheet as given.    Follow-Up:  Your physician recommends that you schedule a follow-up appointment in: [place] physician wants you to follow-up in: [place] will receive a reminder letter in the mail two months in advance. If you don't receive a letter, please call our office to schedule the follow-up appointment.    If you need a refill on your cardiac medications before your next appointment, please call your pharmacy.

## 2016-04-26 NOTE — Progress Notes (Signed)
HPI: FU hypertension. Echocardiogram 09/26/12: Moderate LVH, EF 0000000, normal diastolic function, mild LAE. ETT Myoview 7/14 demonstrated EF 54; no ischemia.Since last seen, he has occasional pain in his left lateral chest area. It can occur while driving. Occasional chest tightness with exertion. Mild dyspnea on exertion but no orthopnea, PND or pedal edema. No syncope. He has run out of his hydrochlorothiazide.  Current Outpatient Prescriptions  Medication Sig Dispense Refill  . hydrochlorothiazide (MICROZIDE) 12.5 MG capsule TAKE TWO CAPSULES BY MOUTH ONCE DAILY 180 capsule 0  . lisinopril (PRINIVIL,ZESTRIL) 40 MG tablet Take 1 tablet (40 mg total) by mouth daily. 90 tablet 3   No current facility-administered medications for this visit.      Past Medical History:  Diagnosis Date  . Abnormal CT scan 08/2012   R axilla adenopathy  . 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 Abnormal RBC 09/26/12   Elliptocytes  . Hx of cardiovascular stress test    ETT-Myoview 7/14:  Normal, EF 54%, no ischemia  . Hypertension   . LVH (left ventricular hypertrophy)    08/2712 echo-EF 55-60%, moderate LVH and mild LA dilatation.   . Microcytosis    MCV 65  . Mild sleep apnea    a. sleep study 9/14:  mild OSA, AHI 10/hour; O2 desat nadir 88% => recommend weight loss  . Prediabetes    A1C 6.1% 08/2012    Past Surgical History:  Procedure Laterality Date  . HAND SURGERY Right 2000   Right hand, 5years ago    Social History   Social History  . Marital status: Single    Spouse name: N/A  . Number of children: N/A  . Years of education: N/A   Occupational History  . Not on file.   Social History Main Topics  . Smoking status: Never Smoker  . Smokeless tobacco: Never Used  . Alcohol use No  . Drug use: No  . Sexual activity: Not on file   Other Topics Concern  . Not on file   Social History Narrative  . No narrative on file    Family History  Problem  Relation Age of Onset  . Heart disease Mother   . Hypertension Mother   . Hypertension Maternal Grandfather   . Heart attack Neg Hx     ROS: no fevers or chills, productive cough, hemoptysis, dysphasia, odynophagia, melena, hematochezia, dysuria, hematuria, rash, seizure activity, orthopnea, PND, pedal edema, claudication. Remaining systems are negative.  Physical Exam: Well-developed well-nourished in no acute distress.  Skin is warm and dry.  HEENT is normal.  Neck is supple. No bruits Chest is clear to auscultation with normal expansion.  Cardiovascular exam is regular rate and rhythm.  Abdominal exam nontender or distended. No masses palpated. Extremities show no edema. neuro grossly intact  ECG-sinus rhythm at a rate of 82. Left ventricular hypertrophy with repolarization abnormality.  A/P  1 hypertension-patient's blood pressure is elevated. Continue lisinopril. Resume hydrochlorothiazide 12.5 mg daily. Add amlodipine 5 mg daily. We discussed the importance of blood pressure control and potential secondary problems associated with uncontrolled hypertension including coronary disease, CVA and renal insufficiency. He will track his blood pressure and we will advance regimen as needed. Follow-up in hypertension clinic in 2-4 weeks. Follow-up with me in 3 months.  2 chest pain-symptoms are atypical. I will arrange a stress echocardiogram. He will need imaging because of baseline left ventricular hypertrophy with repolarization abnormality.   Kirk Ruths, MD

## 2016-05-01 ENCOUNTER — Ambulatory Visit: Payer: Self-pay

## 2016-05-19 ENCOUNTER — Encounter (HOSPITAL_COMMUNITY): Payer: Self-pay

## 2016-05-19 ENCOUNTER — Emergency Department (HOSPITAL_COMMUNITY)
Admission: EM | Admit: 2016-05-19 | Discharge: 2016-05-19 | Discharge status: Against Medical Advice | Payer: BLUE CROSS/BLUE SHIELD | Attending: Emergency Medicine | Admitting: Emergency Medicine

## 2016-05-19 DIAGNOSIS — W19XXXA Unspecified fall, initial encounter: Secondary | ICD-10-CM

## 2016-05-19 DIAGNOSIS — Y929 Unspecified place or not applicable: Secondary | ICD-10-CM | POA: Diagnosis not present

## 2016-05-19 DIAGNOSIS — M542 Cervicalgia: Secondary | ICD-10-CM | POA: Diagnosis not present

## 2016-05-19 DIAGNOSIS — M546 Pain in thoracic spine: Secondary | ICD-10-CM | POA: Insufficient documentation

## 2016-05-19 DIAGNOSIS — Y999 Unspecified external cause status: Secondary | ICD-10-CM | POA: Diagnosis not present

## 2016-05-19 DIAGNOSIS — I10 Essential (primary) hypertension: Secondary | ICD-10-CM | POA: Insufficient documentation

## 2016-05-19 DIAGNOSIS — Z79899 Other long term (current) drug therapy: Secondary | ICD-10-CM | POA: Diagnosis not present

## 2016-05-19 DIAGNOSIS — W1789XA Other fall from one level to another, initial encounter: Secondary | ICD-10-CM | POA: Diagnosis not present

## 2016-05-19 DIAGNOSIS — Y939 Activity, unspecified: Secondary | ICD-10-CM | POA: Diagnosis not present

## 2016-05-19 NOTE — ED Triage Notes (Signed)
Patient complains of fall from trailer 2 days ago and complains of neck and back pain, no loc. States that the pain is worse with turning his head

## 2016-05-19 NOTE — ED Provider Notes (Signed)
Patient fell off the back of a tractor trailer while was stationary 2 days ago landing flat on his back. He complains of mild headache and neck pain since the event. He was onset yesterday and is mild On exam patient is alert no distress. H ENT exam normal psych atraumatic. Neck supple. Trachea midline. No bruit. He has diffuse mild tenderness along the cervical spine. Lungs clear auscultation abdomen obese, nontender neurologic motor is 5 over 5 overall gait normal cranial nerves II through XII grossly intact   Orlie Dakin, MD 05/19/16 1407

## 2016-05-19 NOTE — ED Provider Notes (Signed)
[place] [facility] DEPT Provider Note    By signing my name below, I, Darren Little, attest that this documentation has been prepared under the direction and in the presence of Darren Sails, PA-C. Electronically Signed: Bea Little, ED Scribe. 05/19/16. 1:44 PM.    History   Chief Complaint Chief Complaint  Patient presents with  . fall 2 daya ago    The history is provided by the patient and medical records. No language interpreter was used.    Darren Little is a 36 y.o. male who presents to the Emergency Department complaining of a fall from approximately 5 feet that occurred two days ago. He reports associated bilateral anterior neck and mid back soreness. He also reports an intermittent mild HA since fall. Pt states he fell backwards out of a tractor trailer while cleaning it out. He states a bird flew out towards him causing him to lose his balance and fall backwards directly on the ground landing on his upper back, neck and head. He has not taken anything for pain relief. Turning his neck left and right increases his anterior bilateral neck pain and headache. He denies alleviating factors. He denies LOC, numbness, tingling or weakness of the lower extremities, nausea, vomiting, blurred vision, bruising or wounds. No recent head trauma or concussions.  No anticoagulants. Patient wants to know if it is safe for him to go on a trip to [place].   Past Medical History:  Diagnosis Date  . Abnormal CT scan 08/2012   R axilla adenopathy  . 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 Abnormal RBC 09/26/12   Elliptocytes  . Hx of cardiovascular stress test    ETT-Myoview 7/14:  Normal, EF 54%, no ischemia  . Hypertension   . LVH (left ventricular hypertrophy)    08/2712 echo-EF 55-60%, moderate LVH and mild LA dilatation.   . Microcytosis    MCV 65  . Mild sleep apnea    a. sleep study 9/14:  mild OSA, AHI 10/hour; O2 desat nadir 88% => recommend  weight loss  . Prediabetes    A1C 6.1% 08/2012    Patient Active Problem List   Diagnosis Date Noted  . Accelerated hypertension 11/07/2012  . Hypertensive emergency 09/27/2012  . Microcytosis 09/27/2012  . Abnormal CT of the chest 09/27/2012  . Abnormal platelets ([place]) 09/27/2012  . LVH (left ventricular hypertrophy) 09/27/2012  . Prediabetes 09/27/2012  . Abnormal RBC morphology 09/27/2012  . Pain of left calf 09/27/2012  . Obesity 09/27/2012    Past Surgical History:  Procedure Laterality Date  . HAND SURGERY Right 2000   Right hand, 5years ago       Home Medications    Prior to Admission medications   Medication Sig Start Date End Date Taking? Authorizing Provider  amLODipine (NORVASC) 5 MG tablet Take 1 tablet (5 mg total) by mouth daily. 04/26/16 07/25/16  Lelon Perla, MD  hydrochlorothiazide (MICROZIDE) 12.5 MG capsule Take 1 capsule (12.5 mg total) by mouth daily. 04/26/16   Lelon Perla, MD  lisinopril (PRINIVIL,ZESTRIL) 40 MG tablet Take 1 tablet (40 mg total) by mouth daily. 04/26/16   Lelon Perla, MD    Family History Family History  Problem Relation Age of Onset  . Heart disease Mother   . Hypertension Mother   . Hypertension Maternal Grandfather   . Heart attack Neg Hx     Social History Social History  Substance Use Topics  . Smoking status: Never Smoker  .  Smokeless tobacco: Never Used  . Alcohol use No     Allergies   Bee venom and Other   Review of Systems Review of Systems  Constitutional: Negative for chills and fever.  HENT: Negative for congestion and sore throat.   Eyes: Negative for visual disturbance.  Respiratory: Negative for cough, chest tightness and shortness of breath.   Cardiovascular: Negative for chest pain.  Gastrointestinal: Negative for abdominal pain, constipation, diarrhea, nausea and vomiting.  Genitourinary: Negative for decreased urine volume and difficulty urinating.  Musculoskeletal: Positive for  back pain and neck pain. Negative for arthralgias and joint swelling.  Skin: Negative for color change, rash and wound.  Neurological: Positive for headaches. Negative for dizziness, syncope, weakness, light-headedness and numbness.     Physical Exam Updated Vital Signs BP (!) 139/102   Pulse 68   Temp 98.2 F (36.8 C) (Oral)   Resp 18   SpO2 98%   Physical Exam  Constitutional: He is oriented to person, place, and time. He appears well-developed and well-nourished. No distress.  NAD.  HENT:  Head: Normocephalic and atraumatic.  Right Ear: External ear normal.  Left Ear: External ear normal.  Nose: Nose normal.  Mouth/Throat: Oropharynx is clear and moist. No oropharyngeal exudate.  Moist mucous membranes.  No nasal mucosa edema. Oropharynx and tonsils pink without erythema, edema, exudates or lesions.  Uvula midline. No trismus.   Eyes: Conjunctivae and EOM are normal. Pupils are equal, round, and reactive to light. No scleral icterus.  Neck: Normal range of motion. Neck supple. No JVD present. No tracheal deviation present.  Full neck ROM with reported anterior neck pain along SCM muscle distribution bilaterally.   Cardiovascular: Normal rate, regular rhythm, normal heart sounds and intact distal pulses.   No murmur heard. No carotid bruits. Symmetrical radial pulses bilaterally.  Pulmonary/Chest: Effort normal and breath sounds normal. He has no wheezes.  Abdominal: Soft. He exhibits no distension. There is no tenderness.  Musculoskeletal: Normal range of motion. He exhibits no deformity.  No skull tenderness.  Midline cervical spine tenderness.  Tenderness to palpation to cervical and thoracic paraspinal musculature bilaterally.  Tenderness to anterior lateral neck along SCM muscle distribution. No T or L midline spinal tenderness.  Full upper extremity and lower extremity ROM.   Lymphadenopathy:    He has no cervical adenopathy.  Neurological: He is alert and  oriented to person, place, and time.  Pt is alert and oriented.   Speech and phonation normal.   Thought process coherent.   Strength 5/5 in upper and lower extremities.   Sensation to light touch intact in upper and lower extremities.  Gait normal.   Negative Romberg. No leg drift.  Intact finger to nose test. CN I not tested CN II full visual fields  CN III, IV, VI PEERL and EOM intact CN V light touch intact in all 3 divisions of trigeminal nerve CN VII facial nerve movements intact, symmetric CN VIII hearing intact to finger rub CN IX, X no uvula deviation, symmetric soft palate rise CN XI 5/5 SCM and trapezius strength  CN XII Tongue midline with symmetric L/R movement  Skin: Skin is warm and dry. Capillary refill takes less than 2 seconds.  No ecchymosis, erythema, edema or skin changes or abrasions to back or posterior skull.  Psychiatric: He has a normal mood and affect. His behavior is normal. Judgment and thought content normal.  Nursing note and vitals reviewed.    ED Treatments / Results  DIAGNOSTIC STUDIES: Oxygen Saturation is 98% on RA, normal by my interpretation.   COORDINATION OF CARE: 1:41 PM- Will speak with Dr. Winfred Leeds about plan of care. Pt verbalizes understanding and agrees to plan.  Medications - No data to display  Labs (all labs ordered are listed, but only abnormal results are displayed) Labs Reviewed - No data to display  EKG  EKG Interpretation None       Radiology No results found.  Procedures Procedures (including critical care time)  Medications Ordered in ED Medications - No data to display   Initial Impression / Assessment and Plan / ED Course  I have reviewed the triage vital signs and the nursing notes.  Pertinent labs & imaging results that were available during my care of the patient were reviewed by me and considered in my medical decision making (see chart for details).     Concerned for cervical spine injury  given mild midline cervical spine tenderness.  Also concerned for vertebral or carotid dissection from traumatic fall, although less likely.  Full neck ROM, neruo exam intact.  Discussed patient with Dr. Winfred Leeds who also evaluated patient and suggested CT scan.  CT scan ordered.  Patient left AMA, stating he had to leave by 3pm to get to [place].   I personally performed the services described in this documentation, which was scribed in my presence. The recorded information has been reviewed and is accurate.    Final Clinical Impressions(s) / ED Diagnoses   Final diagnoses:  None    New Prescriptions Discharge Medication List as of 05/19/2016  2:28 PM       Kinnie Feil, PA-C 05/19/16 [place], MD 05/19/16 [phone]

## 2016-05-19 NOTE — ED Notes (Signed)
Pt ambulated to room from waiting area, no complaints of nausea or dizziness.

## 2016-05-24 ENCOUNTER — Encounter: Payer: Self-pay | Admitting: *Deleted

## 2016-06-21 ENCOUNTER — Telehealth (HOSPITAL_COMMUNITY): Payer: Self-pay | Admitting: *Deleted

## 2016-06-21 NOTE — Telephone Encounter (Signed)
Patient given detailed instructions per Stress Test Requisition Sheet for test on 06/27/16 at 7:30.Patient Notified to arrive 30 minutes early, and that it is imperative to arrive on time for appointment to keep from having the test rescheduled.  Patient verbalized understanding. Darren Little

## 2016-06-27 ENCOUNTER — Other Ambulatory Visit (HOSPITAL_COMMUNITY): Payer: BLUE CROSS/BLUE SHIELD

## 2016-06-28 ENCOUNTER — Other Ambulatory Visit (HOSPITAL_COMMUNITY): Payer: BLUE CROSS/BLUE SHIELD

## 2016-07-19 ENCOUNTER — Encounter: Payer: Self-pay | Admitting: Cardiology

## 2016-10-04 ENCOUNTER — Ambulatory Visit (HOSPITAL_COMMUNITY)
Admission: EM | Admit: 2016-10-04 | Discharge: 2016-10-04 | Disposition: A | Discharge status: Home / Self Care | Payer: BLUE CROSS/BLUE SHIELD | Attending: Family Medicine | Admitting: Family Medicine

## 2016-10-04 ENCOUNTER — Encounter (HOSPITAL_COMMUNITY): Payer: Self-pay | Admitting: Family Medicine

## 2016-10-04 DIAGNOSIS — M791 Myalgia, unspecified site: Secondary | ICD-10-CM

## 2016-10-04 DIAGNOSIS — M609 Myositis, unspecified: Secondary | ICD-10-CM | POA: Diagnosis not present

## 2016-10-04 NOTE — ED Provider Notes (Signed)
CSN: [id number]     Arrival date & time 10/04/16  1618 History   None    Chief Complaint  Patient presents with  . 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 It is there most of the time. He is worried about a pinched nerve or stroke. Denies focal weakness. Occasionally he will have some paresthesias but they did not last too long.      Past Medical History:  Diagnosis Date  . Abnormal CT scan 08/2012   R axilla adenopathy  . 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 Abnormal RBC 09/26/12   Elliptocytes  . Hx of cardiovascular stress test    ETT-Myoview 7/14:  Normal, EF 54%, no ischemia  . Hypertension   . LVH (left ventricular hypertrophy)    08/2712 echo-EF 55-60%, moderate LVH and mild LA dilatation.   . Microcytosis    MCV 65  . Mild sleep apnea    a. sleep study 9/14:  mild OSA, AHI 10/hour; O2 desat nadir 88% => recommend weight loss  . Prediabetes    A1C 6.1% 08/2012   Past Surgical History:  Procedure Laterality Date  . HAND SURGERY Right 2000   Right hand, 5years ago   Family History  Problem Relation Age of Onset  . Heart disease Mother   . Hypertension Mother   . Hypertension Maternal Grandfather   . Heart attack Neg Hx    Social History  Substance Use Topics  . Smoking status: Never Smoker  . Smokeless tobacco: Never Used  . Alcohol use No    Review of Systems  Constitutional: Negative.   Respiratory: Negative.   Gastrointestinal: Negative.   Genitourinary: Negative.   Musculoskeletal: Positive for back pain, myalgias and neck stiffness. Negative for neck pain.       As per HPI  Skin: Negative.   Neurological: Negative for dizziness, weakness, numbness and headaches.  All other systems reviewed and are  negative.   Allergies  Bee venom and Other  Home Medications   Prior to Admission medications   Medication Sig Start Date End Date Taking? Authorizing Provider  amLODipine (NORVASC) 5 MG tablet Take 1 tablet (5 mg total) by mouth daily. 04/26/16 07/25/16  Lelon Perla, MD  hydrochlorothiazide (MICROZIDE) 12.5 MG capsule Take 1 capsule (12.5 mg total) by mouth daily. 04/26/16   Lelon Perla, MD  lisinopril (PRINIVIL,ZESTRIL) 40 MG tablet Take 1 tablet (40 mg total) by mouth daily. 04/26/16   Lelon Perla, MD   Meds Ordered and Administered this Visit  Medications - No data to display  BP (!) 150/85   Pulse 75   Temp 98.3 F (36.8 C)   Resp 18   SpO2 98%  No data found.   Physical Exam  Constitutional: He is oriented to person, place, and time. He appears well-developed and well-nourished.  HENT:  Head: Normocephalic and atraumatic.  Eyes: EOM are normal. Left eye exhibits no discharge.  Neck: Normal range of motion. Neck supple.  Pulmonary/Chest: Effort normal.  Musculoskeletal: Normal range of motion. He exhibits tenderness. He exhibits no edema or deformity.  There is tenderness in the following muscles: Left trapezius, left paracervical musculature, left pectoralis major, left supra spinatus, left deltoid, left sore at his group, left hip, left quadriceps and calf.  The muscles today are not nearly as tender and sore as yesterday. He has been out of the truck for a while. His job requires him to keep his left arm up on the steering well moving it left or right and his left leg has very limited range of motion While at work. today he has normal range of motion with minimal discomfort. Strength is 5 over 5 in all extremities. No paresthesias. No spinal tenderness.  Neurological: He is alert and oriented to person, place, and time. No cranial nerve deficit. He exhibits normal muscle tone. Coordination normal.  Skin: Skin is warm and dry.  Psychiatric: He has a normal  mood and affect.  Nursing note and vitals reviewed.   Urgent Care Course     Procedures (including critical care time)  Labs Review Labs Reviewed - No data to display  Imaging Review No results found.   Visual Acuity Review  Right Eye Distance:   Left Eye Distance:   Bilateral Distance:    Right Eye Near:   Left Eye Near:    Bilateral Near:         MDM   1. Myalgia   2. Myofasciitis    The type of job that you do and position that you are maintaining for several hours at a time every day is causing pain in your muscles and the covering of the muscles. This produces diffuse inflammation and pain along the muscles at your constantly using. The best thing is to perform the stretches that were demonstrated for you, apply heat to various areas, stretches often as you can, before you get in the truck and went every get out of the truck, night time and take his many breaks is possible. There is no evidence of pinched nerves, stroke or anything else serious.     Janne Napoleon, NP 10/04/16 [phone]

## 2016-10-04 NOTE — ED Triage Notes (Signed)
Pt here for left side body pain and numbness that has been intermittent for the past 2 or 3 weeks. 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

## 2016-10-04 NOTE — Discharge Instructions (Signed)
The type of job that you do and position that you are maintaining for several hours at a time every day is causing pain in your muscles and the covering of the muscles. This produces diffuse inflammation and pain along the muscles at your constantly using. The best thing is to perform the stretches that were demonstrated for you, apply heat to various areas, stretches often as you can, before you get in the truck and went every get out of the truck, night time and take his many breaks is possible. There is no evidence of pinched nerves, stroke or anything else serious.

## 2016-11-09 ENCOUNTER — Ambulatory Visit (HOSPITAL_COMMUNITY)
Admission: EM | Admit: 2016-11-09 | Discharge: 2016-11-09 | Disposition: A | Discharge status: Home / Self Care | Payer: BLUE CROSS/BLUE SHIELD | Attending: Emergency Medicine | Admitting: Emergency Medicine

## 2016-11-09 ENCOUNTER — Encounter (HOSPITAL_COMMUNITY): Payer: Self-pay | Admitting: Emergency Medicine

## 2016-11-09 DIAGNOSIS — R21 Rash and other nonspecific skin eruption: Secondary | ICD-10-CM

## 2016-11-09 DIAGNOSIS — M7750 Other enthesopathy of unspecified foot: Secondary | ICD-10-CM

## 2016-11-09 MED ORDER — PREDNISONE 10 MG (21) PO TBPK: ORAL_TABLET | Freq: Every day | ORAL | 0 refills | Status: DC

## 2016-11-09 MED ORDER — METHYLPREDNISOLONE ACETATE 80 MG/ML IJ SUSP
INTRAMUSCULAR | Status: AC
  Filled 2016-11-09: qty 1

## 2016-11-09 MED ORDER — METHYLPREDNISOLONE ACETATE 80 MG/ML IJ SUSP
80.0000 mg | Freq: Once | INTRAMUSCULAR | Status: AC
  Administered 2016-11-09: 80 mg via INTRAMUSCULAR

## 2016-11-09 NOTE — Discharge Instructions (Signed)
Take the medicine as directed, follow up with a dermatologist as needed for your rash, and an orthopedist as needed for your foot.

## 2016-11-09 NOTE — ED Triage Notes (Signed)
The patient presented to the [facility] with a complaint of continued left foot pain and swelling x 1.5 months and a rash on his back and torso.

## 2016-11-09 NOTE — ED Provider Notes (Signed)
  [place]   [id number] 11/09/16 Arrival Time: 1522  ASSESSMENT & PLAN:  1. Rash and nonspecific skin eruption   2. Tendonitis of foot     Meds ordered this encounter  Medications  . predniSONE (STERAPRED UNI-PAK 21 TAB) 10 MG (21) TBPK tablet    Sig: Take by mouth daily. Take 6 tabs by mouth daily  for 2 days, then 5 tabs for 2 days, then 4 tabs for 2 days, then 3 tabs for 2 days, 2 tabs for 2 days, then 1 tab by mouth daily for 2 days    Dispense:  42 tablet    Refill:  0    Order Specific Question:   Supervising Provider    AnswerVanessa Kick [[id number]]  . methylPREDNISolone acetate (DEPO-MEDROL) injection 80 mg   For possible tendinitis recommend rest, ice, compression, elevation, For rash, differential is extensive, but will start on a long course of prednisone, follow-up with dermatology as needed  Reviewed expectations re: course of current medical issues. Questions answered. Outlined signs and symptoms indicating need for more acute intervention. Patient verbalized understanding. After Visit Summary given.   SUBJECTIVE:  Darren Little is a 36 y.o. male who presents with complaint of Rash over the entirety of his back, and as well on his abdomen. Rash has developed over the last 48 hours. Denies any new soaps, new detergents, new medicines, or other sources of reaction. He also is complaining of swelling in his left foot. 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  ROS: As per HPI, remainder of ROS negative.   OBJECTIVE:  Vitals:   11/09/16 1607  BP: (!) 166/108  Pulse: 71  Resp: 18  Temp: 98.3 F (36.8 C)  TempSrc: Oral  SpO2: 99%     General appearance: alert; no distress HEENT: normocephalic; atraumatic; conjunctivae normal;  Neck:  Lungs: clear to auscultation bilaterally Heart: regular rate and rhythm Abdomen: soft, non-tender; bowel sounds  normal; no masses or organomegaly; no guarding or rebound tenderness Back: no CVA tenderness Extremities: no cyanosis or edema; symmetrical with no gross deformities, both ankles measure 12 inches in diameter, both calf muscles 17 inches in diameter, negative Homans sign, no palpable cord Skin: warm and dry Neurologic: Grossly normal Psychological:  alert and cooperative; normal mood and affect      Procedures:     Labs Reviewed - No data to display  No results found.  Allergies  Allergen Reactions  . Bee Venom Swelling  . Other Other (See Comments)    Squash- hives    PMHx, SurgHx, SocialHx, Medications, and Allergies were reviewed in the Visit Navigator and updated as appropriate.       Barnet Glasgow, NP 11/09/16 1740

## 2017-03-31 ENCOUNTER — Ambulatory Visit (HOSPITAL_COMMUNITY)
Admission: EM | Admit: 2017-03-31 | Discharge: 2017-03-31 | Disposition: A | Discharge status: Home / Self Care | Payer: BLUE CROSS/BLUE SHIELD | Attending: Internal Medicine | Admitting: Internal Medicine

## 2017-03-31 ENCOUNTER — Encounter (HOSPITAL_COMMUNITY): Payer: Self-pay

## 2017-03-31 ENCOUNTER — Ambulatory Visit (INDEPENDENT_AMBULATORY_CARE_PROVIDER_SITE_OTHER): Payer: BLUE CROSS/BLUE SHIELD

## 2017-03-31 DIAGNOSIS — M67432 Ganglion, left wrist: Secondary | ICD-10-CM

## 2017-03-31 DIAGNOSIS — K5901 Slow transit constipation: Secondary | ICD-10-CM

## 2017-03-31 DIAGNOSIS — K2900 Acute gastritis without bleeding: Secondary | ICD-10-CM

## 2017-03-31 MED ORDER — ONDANSETRON HCL 4 MG PO TABS: 4.0000 mg | ORAL_TABLET | Freq: Four times a day (QID) | ORAL | 0 refills | Status: DC

## 2017-03-31 NOTE — ED Triage Notes (Signed)
Pt on knot on his left hand for 2 months that makes his middle and index finger numb. Not painful to the touch but does swell up from time to time. Also states he has a rash on his torso after his knot swells. Michela Pitcher he is also unable to keep food down, usually after 5 minutes he is either vomiting or having diarrhea.

## 2017-03-31 NOTE — Discharge Instructions (Signed)
MiraLAX as directed, for your stomach take omeprazole which is Prilosec, Zantac 75 mg twice a day. You have a prescription for Zofran to help with nausea. To have the ganglion cyst removed Will have to see a hand surgeon.

## 2017-03-31 NOTE — ED Provider Notes (Signed)
[place]    CSN: [id number] Arrival date & time: 03/31/17  1502     History   Chief Complaint Chief Complaint  Patient presents with  . Cyst    HPI Darren Little is a 36 y.o. male.   36 year old obese male presents with complaint of a knot near the left wrist. He states it started in the mid forearm and moved to the wrist. Sometimes tender with palpation. Sometimes numbness in the fingers. No known injury.   States the past 2 weeks he has had some nausea and vomiting, he has continued to eat pizza, signs, hamburgers, other fast foods and often causes vomiting. This is associated with epigastric discomfort and pain.  States he has had diarrhea for the past week or 2, everything he eats goes through him.  He has 4 small papules on his chest and upper abdomen and is concerned about these lesions he refers to as a rash.  Denies feeling sickly, fever, chills or myalgias.      Past Medical History:  Diagnosis Date  . Abnormal CT scan 08/2012   R axilla adenopathy  . 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 Abnormal RBC 09/26/12   Elliptocytes  . Hx of cardiovascular stress test    ETT-Myoview 7/14:  Normal, EF 54%, no ischemia  . Hypertension   . LVH (left ventricular hypertrophy)    08/2712 echo-EF 55-60%, moderate LVH and mild LA dilatation.   . Microcytosis    MCV 65  . Mild sleep apnea    a. sleep study 9/14:  mild OSA, AHI 10/hour; O2 desat nadir 88% => recommend weight loss  . Prediabetes    A1C 6.1% 08/2012    Patient Active Problem List   Diagnosis Date Noted  . Accelerated hypertension 11/07/2012  . Hypertensive emergency 09/27/2012  . Microcytosis 09/27/2012  . Abnormal CT of the chest 09/27/2012  . Abnormal platelets ([place]) 09/27/2012  . LVH (left ventricular hypertrophy) 09/27/2012  . Prediabetes 09/27/2012  . Abnormal RBC morphology 09/27/2012  . Pain of left calf 09/27/2012  . Obesity 09/27/2012    Past Surgical  History:  Procedure Laterality Date  . HAND SURGERY Right 2000   Right hand, 5years ago       Home Medications    Prior to Admission medications   Medication Sig Start Date End Date Taking? Authorizing Provider  amLODipine (NORVASC) 5 MG tablet Take 1 tablet (5 mg total) by mouth daily. 04/26/16 07/25/16  Lelon Perla, MD  hydrochlorothiazide (MICROZIDE) 12.5 MG capsule Take 1 capsule (12.5 mg total) by mouth daily. 04/26/16   Lelon Perla, MD  lisinopril (PRINIVIL,ZESTRIL) 40 MG tablet Take 1 tablet (40 mg total) by mouth daily. 04/26/16   Lelon Perla, MD  ondansetron (ZOFRAN) 4 MG tablet Take 1 tablet (4 mg total) by mouth every 6 (six) hours. 03/31/17   Janne Napoleon, NP  predniSONE (STERAPRED UNI-PAK 21 TAB) 10 MG (21) TBPK tablet Take by mouth daily. Take 6 tabs by mouth daily  for 2 days, then 5 tabs for 2 days, then 4 tabs for 2 days, then 3 tabs for 2 days, 2 tabs for 2 days, then 1 tab by mouth daily for 2 days 11/09/16   Barnet Glasgow, NP    Family History Family History  Problem Relation Age of Onset  . Heart disease Mother   . Hypertension Mother   . Hypertension Maternal Grandfather   . Heart attack Neg  Hx     Social History Social History   Tobacco Use  . Smoking status: Never Smoker  . Smokeless tobacco: Never Used  Substance Use Topics  . Alcohol use: No  . Drug use: No     Allergies   Bee venom and Other   Review of Systems Review of Systems  Constitutional: Negative.   Gastrointestinal: Positive for constipation, nausea and vomiting.       Loading  Genitourinary: Negative.   Neurological: Negative.   All other systems reviewed and are negative.    Physical Exam Triage Vital Signs ED Triage Vitals  Enc Vitals Group     BP 03/31/17 1650 (!) 154/106     Pulse Rate 03/31/17 1650 77     Resp 03/31/17 1650 18     Temp 03/31/17 1650 98.6 F (37 C)     Temp Source 03/31/17 1650 Oral     SpO2 03/31/17 1650 98 %     Weight --       Height --      Head Circumference --      Peak Flow --      Pain Score 03/31/17 1653 0     Pain Loc --      Pain Edu? --      Excl. in [place]? --    No data found.  Updated Vital Signs BP (!) 154/106 (BP Location: Left Arm) Comment: Notified CMA, pt states he is out of 2 of his 3 BP medications.  Pulse 77   Temp 98.6 F (37 C) (Oral)   Resp 18   SpO2 98%   Visual Acuity Right Eye Distance:   Left Eye Distance:   Bilateral Distance:    Right Eye Near:   Left Eye Near:    Bilateral Near:     Physical Exam  Constitutional: He is oriented to person, place, and time. He appears well-developed and well-nourished. No distress.  Eyes: EOM are normal.  Neck: Normal range of motion. Neck supple.  Cardiovascular: Normal rate.  Pulmonary/Chest: Effort normal. No respiratory distress.  Abdominal:  Abdomen palpates is firm. Percusses dull. Tenderness to the epigastrium only. No guarding or rebound.  Musculoskeletal: He exhibits no edema.  Ganglion type cyst to the left wrist. Mobile, nontender.  Neurological: He is alert and oriented to person, place, and time. He exhibits normal muscle tone.  Skin: Skin is warm and dry.  Psychiatric: He has a normal mood and affect.  Nursing note and vitals reviewed.    UC Treatments / Results  Labs (all labs ordered are listed, but only abnormal results are displayed) Labs Reviewed - No data to display  EKG  EKG Interpretation None       Radiology Dg Abd 1 View  Result Date: 03/31/2017 CLINICAL DATA:  Epigastric pain and bloating for 2 months EXAM: ABDOMEN - 1 VIEW COMPARISON:  None. FINDINGS: The bowel gas pattern is normal. No definite renal or ureteral calculi is identified. Moderate bowel content is identified throughout colon. IMPRESSION: No bowel obstruction. Moderate bowel content identified throughout colon. Electronically Signed   By: Abelardo Diesel M.D.   On: 03/31/2017 17:38    Procedures Procedures (including critical care  time)  Medications Ordered in UC Medications - No data to display   Initial Impression / Assessment and Plan / UC Course  I have reviewed the triage vital signs and the nursing notes.  Pertinent labs & imaging results that were available during my care of the  patient were reviewed by me and considered in my medical decision making (see chart for details).    MiraLAX as directed, for your stomach take omeprazole which is Prilosec, Zantac 75 mg twice a day. You have a prescription for Zofran to help with nausea. To have the ganglion cyst removed Will have to see a hand surgeon.     Final Clinical Impressions(s) / UC Diagnoses   Final diagnoses:  Slow transit constipation  Other acute gastritis without hemorrhage  Ganglion cyst of dorsum of left wrist    ED Discharge Orders        Ordered    ondansetron (ZOFRAN) 4 MG tablet  Every 6 hours     03/31/17 1818       Controlled Substance Prescriptions [place] Controlled Substance Registry consulted? Not Applicable   Janne Napoleon, NP 03/31/17 2021

## 2017-04-01 ENCOUNTER — Other Ambulatory Visit: Payer: Self-pay | Admitting: Cardiology

## 2017-04-01 DIAGNOSIS — I10 Essential (primary) hypertension: Secondary | ICD-10-CM

## 2017-06-24 ENCOUNTER — Encounter: Payer: Self-pay | Admitting: Podiatry

## 2017-06-28 ENCOUNTER — Other Ambulatory Visit: Payer: Self-pay | Admitting: Cardiovascular Disease

## 2017-06-28 ENCOUNTER — Telehealth: Payer: Self-pay | Admitting: Cardiology

## 2017-06-28 DIAGNOSIS — I10 Essential (primary) hypertension: Secondary | ICD-10-CM

## 2017-06-28 MED ORDER — HYDROCHLOROTHIAZIDE 12.5 MG PO CAPS: 12.5000 mg | ORAL_CAPSULE | Freq: Every day | ORAL | 1 refills | Status: DC

## 2017-06-28 MED ORDER — AMLODIPINE BESYLATE 5 MG PO TABS: 5.0000 mg | ORAL_TABLET | Freq: Every day | ORAL | 1 refills | Status: DC

## 2017-06-28 MED ORDER — LISINOPRIL 40 MG PO TABS: 40.0000 mg | ORAL_TABLET | Freq: Every day | ORAL | 1 refills | Status: DC

## 2017-06-28 NOTE — Telephone Encounter (Signed)
Pt came in stating he needs refills on his medication. 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 He stated he only had 5 pills left in his medicine bottles and was getting ready to go on a trip.   Told pt he could schedule an appointment with Dr. Stanford Breed and I will send in his medication refills.  Pt verbalized thanks.  Refills on Amlodipine, lisinopril, and HCTZ sent to [facility] on [facility] Dr. Abbott Pao appt scheduled for 08/01/17 with Rosaria Ferries, PA.

## 2017-06-28 NOTE — Progress Notes (Signed)
This encounter was created in error - please disregard.

## 2017-08-01 ENCOUNTER — Ambulatory Visit: Payer: BLUE CROSS/BLUE SHIELD | Admitting: Physician Assistant

## 2017-08-01 NOTE — Progress Notes (Deleted)
Cardiology Office Note   Date:  08/01/2017   ID:  Darren Little, DOB Mar 07, 1981, MRN [id number]  PCP:  Patient, No Pcp Per  Cardiologist: Dr. Stanford Breed, 04/26/2016 Rosaria Ferries, PA-C   No chief complaint on file.   History of Present Illness: Darren Little is a 37 y.o. male with a history of HTN, nl MV 2014 w/ EF 54%, LVH, obesity, pre-DM, mild OSA>>wt loss recommended.  Darren Little presents for ***   Past Medical History:  Diagnosis Date  . Abnormal CT scan 08/2012   R axilla adenopathy  . 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 Abnormal RBC 09/26/12   Elliptocytes  . Hx of cardiovascular stress test    ETT-Myoview 7/14:  Normal, EF 54%, no ischemia  . Hypertension   . LVH (left ventricular hypertrophy)    08/2712 echo-EF 55-60%, moderate LVH and mild LA dilatation.   . Microcytosis    MCV 65  . Mild sleep apnea    a. sleep study 9/14:  mild OSA, AHI 10/hour; O2 desat nadir 88% => recommend weight loss  . Prediabetes    A1C 6.1% 08/2012    Past Surgical History:  Procedure Laterality Date  . HAND SURGERY Right 2000   Right hand, 5years ago    Current Outpatient Medications  Medication Sig Dispense Refill  . amLODipine (NORVASC) 5 MG tablet Take 1 tablet (5 mg total) by mouth daily. 90 tablet 1  . hydrochlorothiazide (MICROZIDE) 12.5 MG capsule Take 1 capsule (12.5 mg total) by mouth daily. 90 capsule 1  . lisinopril (PRINIVIL,ZESTRIL) 40 MG tablet Take 1 tablet (40 mg total) by mouth daily. 90 tablet 1  . ondansetron (ZOFRAN) 4 MG tablet Take 1 tablet (4 mg total) by mouth every 6 (six) hours. 12 tablet 0  . predniSONE (STERAPRED UNI-PAK 21 TAB) 10 MG (21) TBPK tablet Take by mouth daily. Take 6 tabs by mouth daily  for 2 days, then 5 tabs for 2 days, then 4 tabs for 2 days, then 3 tabs for 2 days, 2 tabs for 2 days, then 1 tab by mouth daily for 2 days 42 tablet 0   No current facility-administered medications for this visit.      Allergies:   Bee venom and Other    Social History:  The patient  reports that he has never smoked. He has never used smokeless tobacco. He reports that he does not drink alcohol or use drugs.   Family History:  The patient's family history includes Heart disease in his mother; Hypertension in his maternal grandfather and mother.    ROS:  Please see the history of present illness. All other systems are reviewed and negative.    PHYSICAL EXAM: VS:  There were no vitals taken for this visit. , BMI There is no height or weight on file to calculate BMI. GEN: Well nourished, well developed, male in no acute distress  HEENT: normal for age  Neck: no JVD, no carotid bruit, no masses Cardiac: RRR; no murmur, no rubs, or gallops Respiratory:  clear to auscultation bilaterally, normal work of breathing GI: soft, nontender, nondistended, + BS MS: no deformity or atrophy; no edema; distal pulses are 2+ in all 4 extremities   Skin: warm and dry, no rash Neuro:  Strength and sensation are intact Psych: euthymic mood, full affect   EKG:  EKG {ACTION; IS/IS GUY:40347425} ordered today. The ekg ordered today demonstrates ***   Recent Labs:  No results found for requested labs within last 8760 hours.    Lipid Panel No results found for: CHOL, TRIG, HDL, CHOLHDL, VLDL, LDLCALC, LDLDIRECT   Wt Readings from Last 3 Encounters:  04/26/16 275 lb 12.8 oz (125.1 kg)  09/21/15 266 lb (120.7 kg)  08/26/15 262 lb (118.8 kg)     Other studies Reviewed: Additional studies/ records that were reviewed today include: ***.  ASSESSMENT AND PLAN:  1.  ***   Current medicines are reviewed at length with the patient today.  The patient {ACTIONS; HAS/DOES NOT HAVE:19233} concerns regarding medicines.  The following changes have been made:  {PLAN; NO CHANGE:13088:s}  Labs/ tests ordered today include: *** No orders of the defined types were placed in this encounter.    Disposition:   FU with  Dr. Stanford Breed  Signed, Rosaria Ferries, PA-C  08/01/2017 8:02 AM    [place] Phone: [phone]; Fax: [phone]  This note was written with the assistance of speech recognition software. Please excuse any transcriptional errors.

## 2017-08-02 ENCOUNTER — Encounter: Payer: Self-pay | Admitting: *Deleted

## 2017-10-11 ENCOUNTER — Other Ambulatory Visit: Payer: Self-pay | Admitting: Cardiology

## 2017-10-11 DIAGNOSIS — I10 Essential (primary) hypertension: Secondary | ICD-10-CM

## 2017-10-11 MED ORDER — LISINOPRIL 40 MG PO TABS: 40.0000 mg | ORAL_TABLET | Freq: Every day | ORAL | 1 refills | Status: DC

## 2017-10-11 MED ORDER — AMLODIPINE BESYLATE 5 MG PO TABS: 5.0000 mg | ORAL_TABLET | Freq: Every day | ORAL | 1 refills | Status: DC

## 2017-10-11 MED ORDER — HYDROCHLOROTHIAZIDE 12.5 MG PO CAPS: 12.5000 mg | ORAL_CAPSULE | Freq: Every day | ORAL | 1 refills | Status: DC

## 2017-10-11 NOTE — Telephone Encounter (Signed)
e

## 2017-10-14 ENCOUNTER — Other Ambulatory Visit: Payer: Self-pay | Admitting: Cardiology

## 2017-10-14 DIAGNOSIS — I10 Essential (primary) hypertension: Secondary | ICD-10-CM

## 2017-10-15 NOTE — Telephone Encounter (Signed)
Rx sent to pharmacy   

## 2017-11-08 ENCOUNTER — Encounter: Payer: Self-pay | Admitting: Cardiology

## 2017-11-08 ENCOUNTER — Encounter: Payer: Self-pay | Admitting: *Deleted

## 2017-11-08 ENCOUNTER — Ambulatory Visit (INDEPENDENT_AMBULATORY_CARE_PROVIDER_SITE_OTHER): Payer: 59 | Admitting: Cardiology

## 2017-11-08 VITALS — BP 144/94 | HR 75 | Ht 72.0 in | Wt 282.8 lb

## 2017-11-08 DIAGNOSIS — I10 Essential (primary) hypertension: Secondary | ICD-10-CM | POA: Diagnosis not present

## 2017-11-08 DIAGNOSIS — R0789 Other chest pain: Secondary | ICD-10-CM

## 2017-11-08 MED ORDER — HYDROCHLOROTHIAZIDE 12.5 MG PO CAPS: 12.5000 mg | ORAL_CAPSULE | Freq: Every day | ORAL | 3 refills | Status: DC

## 2017-11-08 MED ORDER — LISINOPRIL 40 MG PO TABS: 40.0000 mg | ORAL_TABLET | Freq: Every day | ORAL | 3 refills | Status: DC

## 2017-11-08 MED ORDER — AMLODIPINE BESYLATE 5 MG PO TABS: 5.0000 mg | ORAL_TABLET | Freq: Every day | ORAL | 3 refills | Status: DC

## 2017-11-08 NOTE — Patient Instructions (Signed)
Medication Instructions:   NO CHANGE  Labwork:  Your physician recommends that you HAVE LAB WORK TODAY  Follow-Up:  Your physician wants you to follow-up in: ONE YEAR WITH DR CRENSHAW You will receive a reminder letter in the mail two months in advance. If you don't receive a letter, please call our office to schedule the follow-up appointment.   If you need a refill on your cardiac medications before your next appointment, please call your pharmacy.    

## 2017-11-08 NOTE — Progress Notes (Signed)
HPI: FU hypertension. Echocardiogram 09/26/12: Moderate LVH, EF 16-10%, normal diastolic function, mild LAE. ETT Myoview 7/14 demonstrated EF 54; no ischemia. Stress echocardiogram ordered at last office visit but not performed.  Since last seen, patient denies dyspnea, chest pain, palpitations or syncope.  Current Outpatient Medications  Medication Sig Dispense Refill  . amLODipine (NORVASC) 5 MG tablet Take 1 tablet (5 mg total) by mouth daily. 90 tablet 1  . hydrochlorothiazide (MICROZIDE) 12.5 MG capsule Take 1 capsule (12.5 mg total) by mouth daily. 90 capsule 1  . lisinopril (PRINIVIL,ZESTRIL) 40 MG tablet Take 1 tablet (40 mg total) by mouth daily. 90 tablet 1   No current facility-administered medications for this visit.      Past Medical History:  Diagnosis Date  . Abnormal CT scan 08/2012   R axilla adenopathy  . 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 Abnormal RBC 09/26/12   Elliptocytes  . Hx of cardiovascular stress test    ETT-Myoview 7/14:  Normal, EF 54%, no ischemia  . Hypertension   . LVH (left ventricular hypertrophy)    08/2712 echo-EF 55-60%, moderate LVH and mild LA dilatation.   . Microcytosis    MCV 65  . Mild sleep apnea    a. sleep study 9/14:  mild OSA, AHI 10/hour; O2 desat nadir 88% => recommend weight loss  . Prediabetes    A1C 6.1% 08/2012    Past Surgical History:  Procedure Laterality Date  . HAND SURGERY Right 2000   Right hand, 5years ago    Social History   Socioeconomic History  . Marital status: Single    Spouse name: Not on file  . Number of children: Not on file  . Years of education: Not on file  . Highest education level: Not on file  Occupational History  . Not on file  Social Needs  . Financial resource strain: Not on file  . Food insecurity:    Worry: Not on file    Inability: Not on file  . Transportation needs:    Medical: Not on file    Non-medical: Not on file  Tobacco Use  . Smoking status:  Never Smoker  . Smokeless tobacco: Never Used  Substance and Sexual Activity  . Alcohol use: No  . Drug use: No  . Sexual activity: Not on file  Lifestyle  . Physical activity:    Days per week: Not on file    Minutes per session: Not on file  . Stress: Not on file  Relationships  . Social connections:    Talks on phone: Not on file    Gets together: Not on file    Attends religious service: Not on file    Active member of club or organization: Not on file    Attends meetings of clubs or organizations: Not on file    Relationship status: Not on file  . Intimate partner violence:    Fear of current or ex partner: Not on file    Emotionally abused: Not on file    Physically abused: Not on file    Forced sexual activity: Not on file  Other Topics Concern  . Not on file  Social History Narrative  . Not on file    Family History  Problem Relation Age of Onset  . Heart disease Mother   . Hypertension Mother   . Hypertension Maternal Grandfather   . Heart attack Neg Hx     ROS:  Recent GI illness but no fevers or chills, productive cough, hemoptysis, dysphasia, odynophagia, melena, hematochezia, dysuria, hematuria, rash, seizure activity, orthopnea, PND, pedal edema, claudication. Remaining systems are negative.  Physical Exam: Well-developed well-nourished in no acute distress.  Skin is warm and dry.  HEENT is normal.  Neck is supple.  Chest is clear to auscultation with normal expansion.  Cardiovascular exam is regular rate and rhythm.  Abdominal exam nontender or distended. No masses palpated. Extremities show no edema. neuro grossly intact  ECG-sinus rhythm at a rate of 75.  Nonspecific ST changes.  Left ventricular hypertrophy.  Personally reviewed  A/P  1 hypertension-patient's blood pressure is mildly elevated today.  However he follows this closely at home and he states typically in the 120/80 range.  Continue present medications and follow.  Check potassium and  renal function.  2 chest pain-patient has a history of atypical chest pain.  No recent symptoms.  No plans for further evaluation.  Kirk Ruths, MD

## 2017-11-09 LAB — BASIC METABOLIC PANEL
BUN / CREAT RATIO: 13 (ref 9–20)
BUN: 16 mg/dL (ref 6–20)
CALCIUM: 9.4 mg/dL (ref 8.7–10.2)
CHLORIDE: 103 mmol/L (ref 96–106)
CO2: 22 mmol/L (ref 20–29)
Creatinine, Ser: 1.26 mg/dL (ref 0.76–1.27)
GFR calc non Af Amer: 71 mL/min/{1.73_m2} (ref >59)
GFR, EST AFRICAN AMERICAN: 83 mL/min/{1.73_m2} (ref >59)
Glucose: 107 mg/dL — ABNORMAL HIGH (ref 65–99)
POTASSIUM: 4.4 mmol/L (ref 3.5–5.2)
Sodium: 139 mmol/L (ref 134–144)

## 2017-11-11 ENCOUNTER — Encounter: Payer: Self-pay | Admitting: *Deleted

## 2017-11-14 ENCOUNTER — Encounter: Payer: Self-pay | Admitting: Physician Assistant

## 2017-12-08 ENCOUNTER — Encounter (HOSPITAL_COMMUNITY): Payer: Self-pay

## 2017-12-08 ENCOUNTER — Other Ambulatory Visit: Payer: Self-pay

## 2017-12-08 ENCOUNTER — Emergency Department (HOSPITAL_COMMUNITY)
Admission: EM | Admit: 2017-12-08 | Discharge: 2017-12-09 | Disposition: A | Discharge status: Home / Self Care | Payer: 59 | Attending: Emergency Medicine | Admitting: Emergency Medicine

## 2017-12-08 DIAGNOSIS — Z79899 Other long term (current) drug therapy: Secondary | ICD-10-CM | POA: Insufficient documentation

## 2017-12-08 DIAGNOSIS — R7303 Prediabetes: Secondary | ICD-10-CM | POA: Insufficient documentation

## 2017-12-08 DIAGNOSIS — L02811 Cutaneous abscess of head [any part, except face]: Secondary | ICD-10-CM | POA: Insufficient documentation

## 2017-12-08 DIAGNOSIS — I10 Essential (primary) hypertension: Secondary | ICD-10-CM | POA: Insufficient documentation

## 2017-12-08 DIAGNOSIS — L659 Nonscarring hair loss, unspecified: Secondary | ICD-10-CM | POA: Insufficient documentation

## 2017-12-08 DIAGNOSIS — R51 Headache: Secondary | ICD-10-CM | POA: Insufficient documentation

## 2017-12-08 MED ORDER — ONDANSETRON 4 MG PO TBDP
4.0000 mg | ORAL_TABLET | Freq: Once | ORAL | Status: AC
  Administered 2017-12-08: 4 mg via ORAL
  Filled 2017-12-08: qty 1

## 2017-12-08 MED ORDER — LIDOCAINE HCL (PF) 1 % IJ SOLN
30.0000 mL | Freq: Once | INTRAMUSCULAR | Status: AC
  Administered 2017-12-09: 30 mL
  Filled 2017-12-08: qty 30

## 2017-12-08 MED ORDER — OXYCODONE-ACETAMINOPHEN 5-325 MG PO TABS
2.0000 | ORAL_TABLET | Freq: Once | ORAL | Status: AC
  Administered 2017-12-08: 2 via ORAL
  Filled 2017-12-08: qty 2

## 2017-12-08 NOTE — ED Provider Notes (Signed)
[name] Provider Note  CSN: [id number] Arrival date & time: 12/08/17  2021    History   Chief Complaint Chief Complaint  Patient presents with  . Abscess    HPI Darren Little is a 37 y.o. male with a medical history of HTN who presented to the ED for scalp abscess. He reports an abscess that has been on the back of his head for 1 week. Patient states that it began small and it drained on its own, but that it has gradually gotten larger. Associated symptoms: headache, hair loss, and difficulty sleeping 2/2 pain. He has attempted to pop the abscess on his own without success. Denies fever, chills, vision changes, neck pain, neck stiffness, other skin rashes/lesions, weakness or paresthesias.   Past Medical History:  Diagnosis Date  . Abnormal CT scan 08/2012   R axilla adenopathy  . 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 Abnormal RBC 09/26/12   Elliptocytes  . Hx of cardiovascular stress test    ETT-Myoview 7/14:  Normal, EF 54%, no ischemia  . Hypertension   . LVH (left ventricular hypertrophy)    08/2712 echo-EF 55-60%, moderate LVH and mild LA dilatation.   . Microcytosis    MCV 65  . Mild sleep apnea    a. sleep study 9/14:  mild OSA, AHI 10/hour; O2 desat nadir 88% => recommend weight loss  . Prediabetes    A1C 6.1% 08/2012    Patient Active Problem List   Diagnosis Date Noted  . Accelerated hypertension 11/07/2012  . Hypertensive emergency 09/27/2012  . Microcytosis 09/27/2012  . Abnormal CT of the chest 09/27/2012  . Abnormal platelets ([place]) 09/27/2012  . LVH (left ventricular hypertrophy) 09/27/2012  . Prediabetes 09/27/2012  . Abnormal RBC morphology 09/27/2012  . Pain of left calf 09/27/2012  . Obesity 09/27/2012    Past Surgical History:  Procedure Laterality Date  . HAND SURGERY Right 2000   Right hand, 5years ago        Home Medications    Prior to Admission medications   Medication  Sig Start Date End Date Taking? Authorizing Provider  amLODipine (NORVASC) 5 MG tablet Take 1 tablet (5 mg total) by mouth daily. 11/08/17   Lelon Perla, MD  clindamycin (CLEOCIN) 300 MG capsule Take 1 capsule (300 mg total) by mouth 4 (four) times daily for 7 days. 12/09/17 12/16/17  Sharalee Witman, Alvie Heidelberg I, PA-C  hydrochlorothiazide (MICROZIDE) 12.5 MG capsule Take 1 capsule (12.5 mg total) by mouth daily. 11/08/17   Lelon Perla, MD  lisinopril (PRINIVIL,ZESTRIL) 40 MG tablet Take 1 tablet (40 mg total) by mouth daily. 11/08/17   Lelon Perla, MD    Family History Family History  Problem Relation Age of Onset  . Heart disease Mother   . Hypertension Mother   . Hypertension Maternal Grandfather   . Heart attack Neg Hx     Social History Social History   Tobacco Use  . Smoking status: Never Smoker  . Smokeless tobacco: Never Used  Substance Use Topics  . Alcohol use: No  . Drug use: No     Allergies   Bee venom and Other   Review of Systems Review of Systems  Constitutional: Negative for chills and fever.  HENT: Negative for facial swelling.   Eyes: Negative for pain and visual disturbance.  Musculoskeletal: Negative for neck pain and neck stiffness.  Skin: Negative for rash.  Hair loss. Abscess in posterior occipital region  Neurological: Negative for facial asymmetry, weakness and numbness.   Physical Exam Updated Vital Signs BP (!) 154/91 (BP Location: Right Arm)   Pulse 80   Temp 98.6 F (37 C) (Oral)   Resp 16   SpO2 97%   Physical Exam  Constitutional: He appears well-developed and well-nourished.  HENT:  Head:    Neck: Normal range of motion and full passive range of motion without pain. Neck supple. No spinous process tenderness and no muscular tenderness present. No neck rigidity. Normal range of motion present.  Lymphadenopathy:       Head (right side): No occipital adenopathy present.       Head (left side): No occipital adenopathy  present.    He has no cervical adenopathy.  Neurological: He is alert. No cranial nerve deficit.  Skin: Skin is warm. Capillary refill takes less than 2 seconds.  Nursing note and vitals reviewed.    ED Treatments / Results  Labs (all labs ordered are listed, but only abnormal results are displayed) Labs Reviewed - No data to display  EKG None  Radiology No results found.  Procedures .Marland KitchenIncision and Drainage Date/Time: 12/09/2017 12:47 AM Performed by: Romie Jumper, PA-C Authorized by: Romie Jumper, PA-C   Consent:    Consent obtained:  Verbal   Consent given by:  Patient   Risks discussed:  Bleeding, incomplete drainage, infection and pain   Alternatives discussed:  No treatment Location:    Type:  Abscess   Size:  3cm   Location:  Head   Head location:  Scalp Pre-procedure details:    Skin preparation:  Betadine Anesthesia (see MAR for exact dosages):    Anesthesia method:  Local infiltration   Local anesthetic:  Lidocaine 1% w/o epi Procedure type:    Complexity:  Simple Procedure details:    Incision types:  Single straight   Incision depth:  Subcutaneous   Scalpel blade:  11   Wound management:  Irrigated with saline and probed and deloculated   Drainage:  Purulent and bloody   Drainage amount:  Copious   Wound treatment:  Wound left open   Packing materials:  None Post-procedure details:    Patient tolerance of procedure:  Tolerated well, no immediate complications   (including critical care time)  Medications Ordered in ED Medications  lidocaine (PF) (XYLOCAINE) 1 % injection 30 mL (30 mLs Infiltration Given 12/09/17 0007)  oxyCODONE-acetaminophen (PERCOCET/ROXICET) 5-325 MG per tablet 2 tablet (2 tablets Oral Given 12/08/17 2323)  ondansetron (ZOFRAN-ODT) disintegrating tablet 4 mg (4 mg Oral Given 12/08/17 2324)     Initial Impression / Assessment and Plan / ED Course  Triage vital signs and the nursing notes have been  reviewed.  Pertinent labs & imaging results that were available during care of the patient were reviewed and considered in medical decision making (see chart for details).   Patient presents in no acute distress with an occipital scalp abscess. No systemic s/s to suggest a widespread infection or that there is infection infiltrating the neck or skull that needs to be evaluated with imaging. There is no neck pain, stiffness or neuro abnormalities. Simple scalp abscess of his right occipital region visualized on exam. No surrounding erythema without surrounding erythema or warmth to suggest cellulitis. I&D performed without complications.  Final Clinical Impressions(s) / ED Diagnoses  1. Scalp Abscess. I&D performed without complication. Rx for Clindamycin x7 days. Education provided on wound care, follow-up and  s/s of infection.  Dispo: Home. After thorough clinical evaluation, this patient is determined to be medically stable and can be safely discharged with the previously mentioned treatment and/or outpatient follow-up/referral(s). At this time, there are no other apparent medical conditions that require further screening, evaluation or treatment.   Final diagnoses:  Scalp abscess    ED Discharge Orders         Ordered    clindamycin (CLEOCIN) 300 MG capsule  4 times daily     12/09/17 0042            MortisJonelle Sports, PA-C 12/09/17 1250    Noemi Chapel, MD 12/10/17 [phone]

## 2017-12-08 NOTE — ED Triage Notes (Signed)
Pt states for that past month he has had an abscess on the back of his head with alopecia in that area. Pt states that it is causing his to have migraine headaches with nausea and photosensitivity, upon waking he states he cant see. Pt states it has had drainage several times, some fevers.

## 2017-12-08 NOTE — ED Notes (Signed)
I&D tray and lidocaine at bedside 

## 2017-12-08 NOTE — ED Provider Notes (Signed)
Medical screening examination/treatment/procedure(s) were conducted as a shared visit with non-physician practitioner(s) and myself.  I personally evaluated the patient during the encounter.  Clinical Impression:   Final diagnoses:  Scalp abscess    The patient presents with what appears to be an abscess to the posterior occiput, this may be an infected dermoid cyst, it is soft, there is an area of fluctuance, he will need to be incised and drained.  He reports that recently was draining a clear mucoid material but started to drain pus earlier today.  No fevers.   Noemi Chapel, MD 12/10/17 [phone]

## 2017-12-09 MED ORDER — CLINDAMYCIN HCL 300 MG PO CAPS: 300.0000 mg | ORAL_CAPSULE | Freq: Four times a day (QID) | ORAL | 0 refills | Status: AC

## 2017-12-09 NOTE — ED Notes (Signed)
Patient verbalizes understanding of discharge instructions. Opportunity for questioning and answers were provided. Armband removed by staff, pt discharged from ED home via POV.  

## 2017-12-09 NOTE — Discharge Instructions (Signed)
I have drained the abscess on your scalp today. It may continue to bleed and drain a little over the next 1-2 days which is expected. You may apply warm compresses to help facilitate this. For pain, you can use Tylenol and/or Ibuprofen. You may clean area with normal soap and water.  Take the antibiotic, Clindamycin, for the next 7 days. Take all of it to decrease the chances of the infection returning or getting worse.  Follow-up with a medical provider if you have one or more of the following symptoms: fever; increased redness, warmth or tenderness at the wound site; pain beyond where the initial wound was; unusual discharge.  Take care of yourself!

## 2018-01-03 ENCOUNTER — Ambulatory Visit: Payer: Self-pay | Admitting: Cardiology

## 2018-04-24 ENCOUNTER — Emergency Department (HOSPITAL_COMMUNITY)
Admission: EM | Admit: 2018-04-24 | Discharge: 2018-04-24 | Disposition: A | Discharge status: Home / Self Care | Payer: Self-pay | Attending: Emergency Medicine | Admitting: Emergency Medicine

## 2018-04-24 ENCOUNTER — Emergency Department (HOSPITAL_COMMUNITY): Payer: Self-pay

## 2018-04-24 ENCOUNTER — Encounter (HOSPITAL_COMMUNITY): Payer: Self-pay | Admitting: *Deleted

## 2018-04-24 DIAGNOSIS — Z79899 Other long term (current) drug therapy: Secondary | ICD-10-CM | POA: Insufficient documentation

## 2018-04-24 DIAGNOSIS — M6740 Ganglion, unspecified site: Secondary | ICD-10-CM | POA: Insufficient documentation

## 2018-04-24 MED ORDER — NAPROXEN 250 MG PO TABS
500.0000 mg | ORAL_TABLET | Freq: Once | ORAL | Status: AC
  Administered 2018-04-24: 500 mg via ORAL
  Filled 2018-04-24: qty 2

## 2018-04-24 NOTE — ED Provider Notes (Signed)
[facility] EMERGENC[facility] DEPARTMENT Provider Note   CSN: [id number] Arrival date & time: 04/24/18  1452     History   Chief Complaint Chief Complaint  Patient presents with  . Hand Pain    HPI Darren Little is a 38 y.o. male.  38 y.o mal with a PMH of HTN presents to the ED with a chief complaint of left hand pain x 1 week. Patient reports a cyst growing on his hand and increasing in the size in the past week. He reports pain on the dorsal part of his left hand worst with flexion. He reports taking some aspirin for relieve in symptoms but afraid that cyst keeps growing. 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 He denies any sensory weakness, numbness or tingling.      Past Medical History:  Diagnosis Date  . Abnormal CT scan 08/2012   R axilla adenopathy  . 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 Abnormal RBC 09/26/12   Elliptocytes  . Hx of cardiovascular stress test    ETT-Myoview 7/14:  Normal, EF 54%, no ischemia  . Hypertension   . LVH (left ventricular hypertrophy)    08/2712 echo-EF 55-60%, moderate LVH and mild LA dilatation.   . Microcytosis    MCV 65  . Mild sleep apnea    a. sleep study 9/14:  mild OSA, AHI 10/hour; O2 desat nadir 88% => recommend weight loss  . Prediabetes    A1C 6.1% 08/2012    Patient Active Problem List   Diagnosis Date Noted  . Accelerated hypertension 11/07/2012  . Hypertensive emergency 09/27/2012  . Microcytosis 09/27/2012  . Abnormal CT of the chest 09/27/2012  . Abnormal platelets ([place]) 09/27/2012  . LVH (left ventricular hypertrophy) 09/27/2012  . Prediabetes 09/27/2012  . Abnormal RBC morphology 09/27/2012  . Pain of left calf 09/27/2012  . Obesity 09/27/2012    Past Surgical History:  Procedure Laterality Date  . HAND SURGERY Right 2000   Right hand, 5years ago        Home Medications    Prior to Admission  medications   Medication Sig Start Date End Date Taking? Authorizing Provider  amLODipine (NORVASC) 5 MG tablet Take 1 tablet (5 mg total) by mouth daily. 11/08/17   Lelon Perla, MD  hydrochlorothiazide (MICROZIDE) 12.5 MG capsule Take 1 capsule (12.5 mg total) by mouth daily. 11/08/17   Lelon Perla, MD  lisinopril (PRINIVIL,ZESTRIL) 40 MG tablet Take 1 tablet (40 mg total) by mouth daily. 11/08/17   Lelon Perla, MD    Family History Family History  Problem Relation Age of Onset  . Heart disease Mother   . Hypertension Mother   . Hypertension Maternal Grandfather   . Heart attack Neg Hx     Social History Social History   Tobacco Use  . Smoking status: Never Smoker  . Smokeless tobacco: Never Used  Substance Use Topics  . Alcohol use: No  . Drug use: No     Allergies   Bee venom and Other   Review of Systems Review of Systems  Constitutional: Negative for fever.  Musculoskeletal: Positive for arthralgias and myalgias.     Physical Exam Updated Vital Signs BP (!) 146/91 (BP Location: Right Arm)   Pulse 86   Temp 98.2 F (36.8 C) (Oral)   Resp 18   SpO2 95%  Physical Exam Vitals signs and nursing note reviewed.  Constitutional:      Appearance: He is well-developed.  HENT:     Head: Normocephalic and atraumatic.  Eyes:     General: No scleral icterus.    Pupils: Pupils are equal, round, and reactive to light.  Neck:     Musculoskeletal: Normal range of motion.  Cardiovascular:     Heart sounds: Normal heart sounds.  Pulmonary:     Effort: Pulmonary effort is normal.     Breath sounds: Normal breath sounds. No wheezing.  Chest:     Chest wall: No tenderness.  Abdominal:     General: Bowel sounds are normal. There is no distension.     Palpations: Abdomen is soft.     Tenderness: There is no abdominal tenderness.  Musculoskeletal:        General: No deformity.     Left hand: He exhibits tenderness. Normal sensation noted. Normal  strength noted.       Hands:  Skin:    General: Skin is warm and dry.  Neurological:     Mental Status: He is alert and oriented to person, place, and time.      ED Treatments / Results  Labs (all labs ordered are listed, but only abnormal results are displayed) Labs Reviewed - No data to display  EKG None  Radiology Dg Hand Complete Left  Result Date: 04/24/2018 CLINICAL DATA:  Dorsal lump and pain. EXAM: LEFT HAND - COMPLETE 3+ VIEW COMPARISON:  None. FINDINGS: No acute fracture or dislocation. Joint spaces are preserved. Bone mineralization is normal. Focal soft tissue prominence over the dorsal wrist. IMPRESSION: 1. Focal soft tissue prominence over the dorsal wrist. Consider non-emergent ultrasound or MRI of the wrist with and without contrast for further evaluation. 2.  No acute osseous abnormality. Electronically Signed   By: Titus Dubin M.D.   On: 04/24/2018 16:21    Procedures .Marland KitchenIncision and Drainage Date/Time: 04/24/2018 5:03 PM Performed by: Janeece Fitting, PA-C Authorized by: Janeece Fitting, PA-C   Consent:    Consent obtained:  Verbal   Consent given by:  Patient   Risks discussed:  Incomplete drainage, bleeding and infection   Alternatives discussed:  No treatment Location:    Type:  Ganglion cyst   Size:  2cm   Location:  Upper extremity   Upper extremity location:  Wrist   Wrist location:  L wrist Pre-procedure details:    Skin preparation:  Betadine Anesthesia (see MAR for exact dosages):    Anesthesia method:  None Procedure type:    Complexity:  Simple Procedure details:    Needle aspiration: yes     Needle size:  18 G   Incision types:  Single straight   Incision depth:  Submucosal   Drainage:  Serosanguinous   Drainage amount:  Scant   Wound treatment:  Wound left open   Packing materials:  None Post-procedure details:    Patient tolerance of procedure:  Tolerated well, no immediate complications   (including critical care  time)  Medications Ordered in ED Medications  naproxen (NAPROSYN) tablet 500 mg (500 mg Oral Given 04/24/18 1540)     Initial Impression / Assessment and Plan / ED Course  I have reviewed the triage vital signs and the nursing notes.  Pertinent labs & imaging results that were available during my care of the patient were reviewed by me and considered in my medical decision making (see chart for details).   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  He has tried some ibuprofen but reports no relieving symptoms.  X-ray obtained which showed: 1. Focal soft tissue prominence over the dorsal wrist. Consider  non-emergent ultrasound or MRI of the wrist with and without  contrast for further evaluation.  2. No acute osseous abnormality.    I believe lesion is likely consistent with a ganglion cyst, will aspirate fluid as patient is requesting this because he is unable to drive as this provides him with discomfort.  Patient has stable vital signs, no fever, ganglion cyst has been decreasing size significantly he reports some improvement in feeling.  Will send home with NSAIDs to help with swelling.  Return precautions provided at length and orthopedic referral given for further evaluation if needed.   Final Clinical Impressions(s) / ED Diagnoses   Final diagnoses:  Ganglion cyst    ED Discharge Orders    None       Janeece Fitting, PA-C 04/24/18 [place], Julie, MD 04/25/18 1104

## 2018-04-24 NOTE — ED Notes (Signed)
Pt back from X-ray.  

## 2018-04-24 NOTE — Discharge Instructions (Signed)
I have

## 2018-04-24 NOTE — ED Triage Notes (Signed)
Pt noted to have a mass on his left hand. Reports it is causing numbness and pain in his hand.

## 2018-06-09 ENCOUNTER — Ambulatory Visit
Admission: EM | Admit: 2018-06-09 | Discharge: 2018-06-09 | Disposition: A | Discharge status: Home / Self Care | Payer: Worker's Compensation | Attending: Family Medicine | Admitting: Family Medicine

## 2018-06-09 ENCOUNTER — Encounter: Payer: Self-pay | Admitting: Emergency Medicine

## 2018-06-09 DIAGNOSIS — S61412A Laceration without foreign body of left hand, initial encounter: Secondary | ICD-10-CM

## 2018-06-09 DIAGNOSIS — Z23 Encounter for immunization: Secondary | ICD-10-CM

## 2018-06-09 DIAGNOSIS — W230XXA Caught, crushed, jammed, or pinched between moving objects, initial encounter: Secondary | ICD-10-CM

## 2018-06-09 MED ORDER — LIDOCAINE-EPINEPHRINE-TETRACAINE (LET) SOLUTION: 3.0000 mL | Freq: Once | NASAL | Status: DC

## 2018-06-09 MED ORDER — TETANUS-DIPHTH-ACELL PERTUSSIS 5-2.5-18.5 LF-MCG/0.5 IM SUSP
0.5000 mL | Freq: Once | INTRAMUSCULAR | Status: AC
  Administered 2018-06-09: 0.5 mL via INTRAMUSCULAR

## 2018-06-09 NOTE — ED Notes (Signed)
Patient able to ambulate independently  

## 2018-06-09 NOTE — ED Triage Notes (Signed)
Pt presents to [facility] fro assessment of laceration to pointed finger of left hand, occurred today from a piece of metal on his truck at approx 12pm, States bleeding lasted approx 20 minutes.  Bleeding controlled at this time.  Unknown last tetanus.

## 2018-06-09 NOTE — ED Provider Notes (Signed)
[facility]    CSN: [id number] Arrival date & time: 06/09/18  1649     History   Chief Complaint Chief Complaint  Patient presents with  . Laceration    HPI Darren Little is a 38 y.o. male.   HPI  Patient is a Administrator by trade.  He was closing the rear doors of his truck when the wind blew them shot and closed his hand in between.  He has a laceration to manage.  Tetanus is not up-to-date.  He can move his hand fully.  He is limited to skin   Past Medical History:  Diagnosis Date  . Abnormal CT scan 08/2012   R axilla adenopathy  . 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 Abnormal RBC 09/26/12   Elliptocytes  . Hx of cardiovascular stress test    ETT-Myoview 7/14:  Normal, EF 54%, no ischemia  . Hypertension   . LVH (left ventricular hypertrophy)    08/2712 echo-EF 55-60%, moderate LVH and mild LA dilatation.   . Microcytosis    MCV 65  . Mild sleep apnea    a. sleep study 9/14:  mild OSA, AHI 10/hour; O2 desat nadir 88% => recommend weight loss  . Prediabetes    A1C 6.1% 08/2012    Patient Active Problem List   Diagnosis Date Noted  . Accelerated hypertension 11/07/2012  . Hypertensive emergency 09/27/2012  . Microcytosis 09/27/2012  . Abnormal CT of the chest 09/27/2012  . Abnormal platelets ([place]) 09/27/2012  . LVH (left ventricular hypertrophy) 09/27/2012  . Prediabetes 09/27/2012  . Abnormal RBC morphology 09/27/2012  . Pain of left calf 09/27/2012  . Obesity 09/27/2012    Past Surgical History:  Procedure Laterality Date  . HAND SURGERY Right 2000   Right hand, 5years ago       Home Medications    Prior to Admission medications   Medication Sig Start Date End Date Taking? Authorizing Provider  amLODipine (NORVASC) 5 MG tablet Take 1 tablet (5 mg total) by mouth daily. 11/08/17  Yes Lelon Perla, MD  hydrochlorothiazide (MICROZIDE) 12.5 MG capsule Take 1 capsule (12.5 mg total) by mouth daily. 11/08/17  Yes  Lelon Perla, MD  lisinopril (PRINIVIL,ZESTRIL) 40 MG tablet Take 1 tablet (40 mg total) by mouth daily. 11/08/17  Yes Lelon Perla, MD    Family History Family History  Problem Relation Age of Onset  . Heart disease Mother   . Hypertension Mother   . Hypertension Maternal Grandfather   . Heart attack Neg Hx     Social History Social History   Tobacco Use  . Smoking status: Never Smoker  . Smokeless tobacco: Never Used  Substance Use Topics  . Alcohol use: No  . Drug use: No     Allergies   Bee venom and Other   Review of Systems Review of Systems  Constitutional: Negative for chills and fever.  HENT: Negative for ear pain and sore throat.   Eyes: Negative for pain and visual disturbance.  Respiratory: Negative for cough and shortness of breath.   Cardiovascular: Negative for chest pain and palpitations.  Gastrointestinal: Negative for abdominal pain and vomiting.  Genitourinary: Negative for dysuria and hematuria.  Musculoskeletal: Negative for arthralgias and back pain.  Skin: Positive for wound. Negative for color change and rash.  Neurological: Negative for seizures and syncope.  All other systems reviewed and are negative.    Physical Exam Triage Vital Signs ED  Triage Vitals  Enc Vitals Group     BP 06/09/18 1708 134/90     Pulse Rate 06/09/18 1708 89     Resp 06/09/18 1708 18     Temp 06/09/18 1708 97.9 F (36.6 C)     Temp Source 06/09/18 1708 Oral     SpO2 06/09/18 1708 95 %     Weight --      Height --      Head Circumference --      Peak Flow --      Pain Score 06/09/18 1709 9     Pain Loc --      Pain Edu? --      Excl. in [place]? --    No data found.  Updated Vital Signs BP 134/90 (BP Location: Left Arm)   Pulse 89   Temp 97.9 F (36.6 C) (Oral)   Resp 18   SpO2 95%   Visual Acuity Right Eye Distance:   Left Eye Distance:   Bilateral Distance:    Right Eye Near:   Left Eye Near:    Bilateral Near:     Physical  Exam Constitutional:      General: He is not in acute distress.    Appearance: He is well-developed and normal weight.  HENT:     Head: Normocephalic and atraumatic.  Eyes:     Conjunctiva/sclera: Conjunctivae normal.     Pupils: Pupils are equal, round, and reactive to light.  Neck:     Musculoskeletal: Normal range of motion.  Cardiovascular:     Rate and Rhythm: Normal rate.  Pulmonary:     Effort: Pulmonary effort is normal. No respiratory distress.  Abdominal:     General: There is no distension.     Palpations: Abdomen is soft.  Musculoskeletal: Normal range of motion.  Skin:    General: Skin is warm and dry.     Comments: Laceration dorsum of hand over the index MCP joint area, 4.5 cm, superficial  Neurological:     Mental Status: He is alert.  Psychiatric:        Mood and Affect: Mood normal.        Behavior: Behavior normal.    Area  Soaked and cleansed.  Center of wound scrubbed with q tip to remove grease Closed with skin glue   UC Treatments / Results  Labs (all labs ordered are listed, but only abnormal results are displayed) Labs Reviewed - No data to display  EKG None  Radiology No results found.  Procedures Procedures (including critical care time)  Medications Ordered in UC Medications  Tdap (BOOSTRIX) injection 0.5 mL (0.5 mLs Intramuscular Given 06/09/18 1731)    Initial Impression / Assessment and Plan / UC Course  I have reviewed the triage vital signs and the nursing notes.  Pertinent labs & imaging results that were available during my care of the patient were reviewed by me and considered in my medical decision making (see chart for details).     Discussed wound care Signs of infection. Tetanus given Final Clinical Impressions(s) / UC Diagnoses   Final diagnoses:  Laceration of left hand, foreign body presence unspecified, initial encounter     Discharge Instructions     Watch for infection - redness, pus, increased  pain Leave the glue on until it falls off ,  3 -5 days Return for problems    ED Prescriptions    None     Controlled Substance Prescriptions [place] Controlled  Substance Registry consulted? Not Applicable   Raylene Everts, MD 06/09/18 1753

## 2018-06-09 NOTE — Discharge Instructions (Addendum)
Watch for infection - redness, pus, increased pain Leave the glue on until it falls off ,  3 -5 days Return for problems

## 2018-10-08 ENCOUNTER — Telehealth: Payer: Self-pay | Admitting: *Deleted

## 2018-10-08 NOTE — Telephone Encounter (Signed)
Unable to leave a message, mail box full. 

## 2018-10-13 ENCOUNTER — Other Ambulatory Visit: Payer: Self-pay

## 2018-10-13 ENCOUNTER — Emergency Department (HOSPITAL_COMMUNITY)
Admission: EM | Admit: 2018-10-13 | Discharge: 2018-10-13 | Disposition: A | Discharge status: Home / Self Care | Payer: Self-pay | Attending: Emergency Medicine | Admitting: Emergency Medicine

## 2018-10-13 ENCOUNTER — Encounter (HOSPITAL_COMMUNITY): Payer: Self-pay

## 2018-10-13 DIAGNOSIS — Z79899 Other long term (current) drug therapy: Secondary | ICD-10-CM | POA: Insufficient documentation

## 2018-10-13 DIAGNOSIS — I10 Essential (primary) hypertension: Secondary | ICD-10-CM | POA: Insufficient documentation

## 2018-10-13 DIAGNOSIS — T7840XA Allergy, unspecified, initial encounter: Secondary | ICD-10-CM | POA: Insufficient documentation

## 2018-10-13 MED ORDER — FAMOTIDINE IN NACL 20-0.9 MG/50ML-% IV SOLN
20.0000 mg | Freq: Once | INTRAVENOUS | Status: AC
  Administered 2018-10-13: 20 mg via INTRAVENOUS
  Filled 2018-10-13: qty 50

## 2018-10-13 MED ORDER — DIPHENHYDRAMINE HCL 50 MG/ML IJ SOLN
50.0000 mg | Freq: Once | INTRAMUSCULAR | Status: AC
  Administered 2018-10-13: 09:00:00 50 mg via INTRAVENOUS
  Filled 2018-10-13: qty 1

## 2018-10-13 MED ORDER — DEXAMETHASONE SODIUM PHOSPHATE 10 MG/ML IJ SOLN
10.0000 mg | Freq: Once | INTRAMUSCULAR | Status: AC
  Administered 2018-10-13: 10 mg via INTRAVENOUS
  Filled 2018-10-13: qty 1

## 2018-10-13 MED ORDER — SODIUM CHLORIDE 0.9 % IV BOLUS
1000.0000 mL | Freq: Once | INTRAVENOUS | Status: AC
  Administered 2018-10-13: 1000 mL via INTRAVENOUS

## 2018-10-13 MED ORDER — EPINEPHRINE 0.3 MG/0.3ML IJ SOAJ: 0.3000 mg | INTRAMUSCULAR | 0 refills | Status: DC | PRN

## 2018-10-13 NOTE — ED Triage Notes (Signed)
Pt states he was working on his truck this AM and was attach by insects. Pt has hives to LUE, Back, with dizziness. Pt denies N/V

## 2018-10-13 NOTE — ED Provider Notes (Signed)
[facility] EMERGENCY DEPARTMENT Provider Note   CSN: [id number] Arrival date & time: 10/13/18  0321     History   Chief Complaint Chief Complaint  Patient presents with  . Allergic Reaction    HPI Darren Little is a 38 y.o. male.     HPI  15yM with possible allergic reaction. Says he was bitten several times by insects this morning while working on his vehicle. Rash and diffuse itching. Feels lightheaded. No cough, wheezing or dyspnea. Denies any GI complaints. He is not sure what type of insects. No intervention prior to arrival.   Past Medical History:  Diagnosis Date  . Abnormal CT scan 08/2012   R axilla adenopathy  . 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 Abnormal RBC 09/26/12   Elliptocytes  . Hx of cardiovascular stress test    ETT-Myoview 7/14:  Normal, EF 54%, no ischemia  . Hypertension   . LVH (left ventricular hypertrophy)    08/2712 echo-EF 55-60%, moderate LVH and mild LA dilatation.   . Microcytosis    MCV 65  . Mild sleep apnea    a. sleep study 9/14:  mild OSA, AHI 10/hour; O2 desat nadir 88% => recommend weight loss  . Prediabetes    A1C 6.1% 08/2012    Patient Active Problem List   Diagnosis Date Noted  . Accelerated hypertension 11/07/2012  . Hypertensive emergency 09/27/2012  . Microcytosis 09/27/2012  . Abnormal CT of the chest 09/27/2012  . Abnormal platelets ([place]) 09/27/2012  . LVH (left ventricular hypertrophy) 09/27/2012  . Prediabetes 09/27/2012  . Abnormal RBC morphology 09/27/2012  . Pain of left calf 09/27/2012  . Obesity 09/27/2012    Past Surgical History:  Procedure Laterality Date  . HAND SURGERY Right 2000   Right hand, 5years ago        Home Medications    Prior to Admission medications   Medication Sig Start Date End Date Taking? Authorizing Provider  amLODipine (NORVASC) 5 MG tablet Take 1 tablet (5 mg total) by mouth daily. 11/08/17   Lelon Perla, MD   hydrochlorothiazide (MICROZIDE) 12.5 MG capsule Take 1 capsule (12.5 mg total) by mouth daily. 11/08/17   Lelon Perla, MD  lisinopril (PRINIVIL,ZESTRIL) 40 MG tablet Take 1 tablet (40 mg total) by mouth daily. 11/08/17   Lelon Perla, MD    Family History Family History  Problem Relation Age of Onset  . Heart disease Mother   . Hypertension Mother   . Hypertension Maternal Grandfather   . Heart attack Neg Hx     Social History Social History   Tobacco Use  . Smoking status: Never Smoker  . Smokeless tobacco: Never Used  Substance Use Topics  . Alcohol use: No  . Drug use: No     Allergies   Bee venom and Other   Review of Systems Review of Systems  All systems reviewed and negative, other than as noted in HPI.  Physical Exam Updated Vital Signs Ht 6' (1.829 m)   Wt 127 kg   BMI 37.97 kg/m   Physical Exam Vitals signs and nursing note reviewed.  Constitutional:      General: He is not in acute distress.    Appearance: He is well-developed.  HENT:     Head: Normocephalic and atraumatic.  Eyes:     General:        Right eye: No discharge.        Left  eye: No discharge.     Conjunctiva/sclera: Conjunctivae normal.  Neck:     Musculoskeletal: Neck supple.  Cardiovascular:     Rate and Rhythm: Normal rate and regular rhythm.     Heart sounds: Normal heart sounds. No murmur. No friction rub. No gallop.   Pulmonary:     Effort: Pulmonary effort is normal. No respiratory distress.     Breath sounds: Normal breath sounds.  Abdominal:     General: There is no distension.     Palpations: Abdomen is soft.     Tenderness: There is no abdominal tenderness.  Musculoskeletal:        General: No tenderness.  Skin:    General: Skin is warm and dry.     Findings: Rash present.     Comments: Sparse but diffuse urticarial rash. No oropharyngeal swelling.   Neurological:     Mental Status: He is alert.  Psychiatric:        Behavior: Behavior normal.         Thought Content: Thought content normal.      ED Treatments / Results  Labs (all labs ordered are listed, but only abnormal results are displayed) Labs Reviewed - No data to display  EKG None  Radiology No results found.  Procedures Procedures (including critical care time)  Medications Ordered in ED Medications - No data to display   Initial Impression / Assessment and Plan / ED Course  I have reviewed the triage vital signs and the nursing notes.  Pertinent labs & imaging results that were available during my care of the patient were reviewed by me and considered in my medical decision making (see chart for details).        38 year old male with urticaria and dizziness after being bitten by insects.  He is observed for several hours with improvement of his symptoms.  He seemed medically stable.  No respiratory complaints.  At this time, I feel he is medically appropriate for discharge.  Return precautions were discussed.  DC with EpiPen's.  Continued as needed Benadryl.  Final Clinical Impressions(s) / ED Diagnoses   Final diagnoses:  Allergic reaction, initial encounter    ED Discharge Orders    None       Virgel Manifold, MD 10/17/18 1114

## 2018-11-03 ENCOUNTER — Ambulatory Visit: Payer: Self-pay | Admitting: Cardiology

## 2018-12-29 ENCOUNTER — Telehealth: Payer: Self-pay

## 2018-12-29 NOTE — Telephone Encounter (Signed)
Called patient to do their pre-visit COVID screening.  Call went to voicemail. Unable to do prescreening.  

## 2018-12-30 ENCOUNTER — Other Ambulatory Visit: Payer: Self-pay

## 2018-12-30 ENCOUNTER — Encounter: Payer: Self-pay | Admitting: Internal Medicine

## 2018-12-30 ENCOUNTER — Ambulatory Visit (INDEPENDENT_AMBULATORY_CARE_PROVIDER_SITE_OTHER): Payer: 59 | Admitting: Internal Medicine

## 2018-12-30 VITALS — BP 158/105 | HR 85 | Temp 97.5°F | Resp 17 | Wt 275.8 lb

## 2018-12-30 DIAGNOSIS — R079 Chest pain, unspecified: Secondary | ICD-10-CM

## 2018-12-30 DIAGNOSIS — R7303 Prediabetes: Secondary | ICD-10-CM | POA: Diagnosis not present

## 2018-12-30 DIAGNOSIS — Z2821 Immunization not carried out because of patient refusal: Secondary | ICD-10-CM

## 2018-12-30 DIAGNOSIS — I10 Essential (primary) hypertension: Secondary | ICD-10-CM

## 2018-12-30 DIAGNOSIS — R252 Cramp and spasm: Secondary | ICD-10-CM | POA: Diagnosis not present

## 2018-12-30 DIAGNOSIS — Z6837 Body mass index (BMI) 37.0-37.9, adult: Secondary | ICD-10-CM

## 2018-12-30 LAB — POCT URINALYSIS DIP (CLINITEK)
Bilirubin, UA: NEGATIVE
Blood, UA: NEGATIVE
Glucose, UA: 500 mg/dL — AB
Leukocytes, UA: NEGATIVE
Nitrite, UA: NEGATIVE
POC PROTEIN,UA: 100 — AB
Spec Grav, UA: >=1.03 — AB (ref 1.010–1.025)
Urobilinogen, UA: 0.2 E.U./dL
pH, UA: 5.5 (ref 5.0–8.0)

## 2018-12-30 MED ORDER — AMLODIPINE BESYLATE 10 MG PO TABS: 10.0000 mg | ORAL_TABLET | Freq: Every day | ORAL | 6 refills | Status: DC

## 2018-12-30 NOTE — Progress Notes (Signed)
Here to get established.  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  Also needs a sleep study.

## 2018-12-30 NOTE — Patient Instructions (Signed)
Your blood pressure is not controlled.  Increase amlodipine to 10 mg daily.  Most likely you have progressed to diabetes. We have run some blood test on you to check for this.  We will get back to you once we have the results.   Diabetes Mellitus and Standards of Medical Care Managing diabetes (diabetes mellitus) can be complicated. Your diabetes treatment may be managed by a team of health care providers, including:  A physician who specializes in diabetes (endocrinologist).  A nurse practitioner or physician assistant.  Nurses.  A diet and nutrition specialist (registered dietitian).  A certified diabetes educator (CDE).  An exercise specialist.  A pharmacist.  An eye doctor.  A foot specialist (podiatrist).  A dentist.  A primary care provider.  A mental health provider. Your health care providers follow guidelines to help you get the best quality of care. The following schedule is a general guideline for your diabetes management plan. Your health care providers may give you more specific instructions. Physical exams Upon being diagnosed with diabetes mellitus, and each year after that, your health care provider will ask about your medical and family history. He or she will also do a physical exam. Your exam may include:  Measuring your height, weight, and body mass index (BMI).  Checking your blood pressure. This will be done at every routine medical visit. Your target blood pressure may vary depending on your medical conditions, your age, and other factors.  Thyroid gland exam.  Skin exam.  Screening for damage to your nerves (peripheral neuropathy). This may include checking the pulse in your legs and feet and checking the level of sensation in your hands and feet.  A complete foot exam to inspect the structure and skin of your feet, including checking for cuts, bruises, redness, blisters, sores, or other problems.  Screening for blood vessel (vascular) problems,  which may include checking the pulse in your legs and feet and checking your temperature. Blood tests Depending on your treatment plan and your personal needs, you may have the following tests done:  HbA1c (hemoglobin A1c). This test provides information about blood sugar (glucose) control over the previous 2-3 months. It is used to adjust your treatment plan, if needed. This test will be done: ? At least 2 times a year, if you are meeting your treatment goals. ? 4 times a year, if you are not meeting your treatment goals or if treatment goals have changed.  Lipid testing, including total, LDL, and HDL cholesterol and triglyceride levels. ? The goal for LDL is less than 100 mg/dL (5.5 mmol/L). If you are at high risk for complications, the goal is less than 70 mg/dL (3.9 mmol/L). ? The goal for HDL is 40 mg/dL (2.2 mmol/L) or higher for men and 50 mg/dL (2.8 mmol/L) or higher for women. An HDL cholesterol of 60 mg/dL (3.3 mmol/L) or higher gives some protection against heart disease. ? The goal for triglycerides is less than 150 mg/dL (8.3 mmol/L).  Liver function tests.  Kidney function tests.  Thyroid function tests. Dental and eye exams  Visit your dentist two times a year.  If you have type 1 diabetes, your health care provider may recommend an eye exam 3-5 years after you are diagnosed, and then once a year after your first exam. ? For children with type 1 diabetes, a health care provider may recommend an eye exam when your child is age 71 or older and has had diabetes for 3-5 years. After the  first exam, your child should get an eye exam once a year.  If you have type 2 diabetes, your health care provider may recommend an eye exam as soon as you are diagnosed, and then once a year after your first exam. Immunizations   The yearly flu (influenza) vaccine is recommended for everyone 6 months or older who has diabetes.  The pneumonia (pneumococcal) vaccine is recommended for  everyone 2 years or older who has diabetes. If you are 73 or older, you may get the pneumonia vaccine as a series of two separate shots.  The hepatitis B vaccine is recommended for adults shortly after being diagnosed with diabetes.  Adults and children with diabetes should receive all other vaccines according to age-specific recommendations from the Centers for Disease Control and Prevention (CDC). Mental and emotional health Screening for symptoms of eating disorders, anxiety, and depression is recommended at the time of diagnosis and afterward as needed. If your screening shows that you have symptoms (positive screening result), you may need more evaluation and you may work with a mental health care provider. Treatment plan Your treatment plan will be reviewed at every medical visit. You and your health care provider will discuss:  How you are taking your medicines, including insulin.  Any side effects you are experiencing.  Your blood glucose target goals.  The frequency of your blood glucose monitoring.  Lifestyle habits, such as activity level as well as tobacco, alcohol, and substance use. Diabetes self-management education Your health care provider will assess how well you are monitoring your blood glucose levels and whether you are taking your insulin correctly. He or she may refer you to:  A certified diabetes educator to manage your diabetes throughout your life, starting at diagnosis.  A registered dietitian who can create or review your personal nutrition plan.  An exercise specialist who can discuss your activity level and exercise plan. Summary  Managing diabetes (diabetes mellitus) can be complicated. Your diabetes treatment may be managed by a team of health care providers.  Your health care providers follow guidelines in order to help you get the best quality of care.  Standards of care including having regular physical exams, blood tests, blood pressure monitoring,  immunizations, screening tests, and education about how to manage your diabetes.  Your health care providers may also give you more specific instructions based on your individual health. This information is not intended to replace advice given to you by your health care provider. Make sure you discuss any questions you have with your health care provider. Document Released: 01/14/2009 Document Revised: 12/06/2017 Document Reviewed: 12/16/2015 Elsevier Patient Education  2020 [employer].

## 2018-12-30 NOTE — Progress Notes (Signed)
Patient ID: Darren Little, male    DOB: October 10, 1980  MRN: [id number]  CC: Establish Care, Diabetes, and Hypertension   Subjective: Darren Little is a 38 y.o. male who presents for new pt visit at [facility] His concerns today include:  Pt with hx of accelerated HTN (Dr. 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  Told he has sugar in urine and may need sleep test and stress test.  Patient given until December to have these things done. -dx with pre-DM 1-2 yrs ago.   Fhx of DM in mom  -pt endorses polyuria which he attributes to HCTZ.  Drinks 5-9 bottles water a day -no blurred vision -no wgh loss No numbness.  Some muscle cramps in calf BL with prolong sitting and after he walks 1 mile which he does once a week..   Exercise: walks a mile every Friday.   Eating habits: Does not eat fried foods; eating more vegetables including salads. Does not eat too much beef or chicken   HTN:  Compliant with meds that include amlodipine 5 mg, hydrochlorothiazide 12.5 mg and lisinopril 40 mg daily.  He is followed by cardiologist Dr. Stanford Breed for accelerated hypertension. Limits salt. No device to check BP Notice intermittent tingling and poking pain LT side of chest x 1 yr.  Occurs occasionally when he is very tired.  Felt it last evening.   -no radiation, no SOB -no SOB or CP after walking a mile every Friday.   Wife told him he snores only when tired for first 10 mins when he is falling asleep.  Rest of the night he does not snore.  Feels refreshed when he awakes.  No morning HA No falling asleep easily during the day. Reports having had sleep study done about 5 to 7 years ago which was negative for sleep apnea.  Past medical, social, family history and surgical histories reviewed. Patient Active Problem List   Diagnosis Date Noted  . Accelerated hypertension 11/07/2012  . Hypertensive emergency 09/27/2012  . Microcytosis 09/27/2012  .  Abnormal CT of the chest 09/27/2012  . Abnormal platelets ([place]) 09/27/2012  . LVH (left ventricular hypertrophy) 09/27/2012  . Prediabetes 09/27/2012  . Abnormal RBC morphology 09/27/2012  . Pain of left calf 09/27/2012  . Obesity 09/27/2012     Current Outpatient Medications on File Prior to Visit  Medication Sig Dispense Refill  . EPINEPHrine (EPIPEN 2-PAK) 0.3 mg/0.3 mL IJ SOAJ injection Inject 0.3 mLs (0.3 mg total) into the muscle as needed for anaphylaxis. 2 each 0  . hydrochlorothiazide (MICROZIDE) 12.5 MG capsule Take 1 capsule (12.5 mg total) by mouth daily. 90 capsule 3  . lisinopril (PRINIVIL,ZESTRIL) 40 MG tablet Take 1 tablet (40 mg total) by mouth daily. 90 tablet 3   No current facility-administered medications on file prior to visit.     Allergies  Allergen Reactions  . Bee Venom Swelling  . Other Other (See Comments)    Squash- hives    Social History   Socioeconomic History  . Marital status: Married    Spouse name: Not on file  . Number of children: 2  . Years of education: 12 grade  . Highest education level: Not on file  Occupational History  . Not on file  Social Needs  . Financial resource strain: Not on file  . Food insecurity    Worry: Not on file    Inability: Not on file  .  Transportation needs    Medical: Not on file    Non-medical: Not on file  Tobacco Use  . Smoking status: Never Smoker  . Smokeless tobacco: Never Used  Substance and Sexual Activity  . Alcohol use: No  . Drug use: No  . Sexual activity: Not on file  Lifestyle  . Physical activity    Days per week: Not on file    Minutes per session: Not on file  . Stress: Not on file  Relationships  . Social Herbalist on phone: Not on file    Gets together: Not on file    Attends religious service: Not on file    Active member of club or organization: Not on file    Attends meetings of clubs or organizations: Not on file    Relationship status: Not on file  .  Intimate partner violence    Fear of current or ex partner: Not on file    Emotionally abused: Not on file    Physically abused: Not on file    Forced sexual activity: Not on file  Other Topics Concern  . Not on file  Social History Narrative  . Not on file    Family History  Problem Relation Age of Onset  . Heart disease Mother   . Hypertension Mother   . Hypertension Maternal Grandfather   . Heart attack Neg Hx     Past Surgical History:  Procedure Laterality Date  . HAND SURGERY Right 2000   Right hand, 5years ago    ROS: Review of Systems Negative except as stated above  PHYSICAL EXAM: BP (!) 158/105   Pulse 85   Temp (!) 97.5 F (36.4 C) (Temporal)   Resp 17   Wt 275 lb 12.8 oz (125.1 kg)   SpO2 96%   BMI 37.41 kg/m    Physical Exam  General appearance - alert, well appearing, obese middle-aged African-American male and in no distress Mental status - normal mood, behavior, speech, dress, motor activity, and thought processes Eyes - pupils equal and reactive, extraocular eye movements intact Nose - normal and patent, no erythema, discharge or polyps Mouth - mucous membranes moist, pharynx normal without lesions Neck - supple, no significant adenopathy Chest - clear to auscultation, no wheezes, rales or rhonchi, symmetric air entry Heart - normal rate, regular rhythm, normal S1, S2, no murmurs, rubs, clicks or gallops Extremities - peripheral pulses normal, no pedal edema, no clubbing or cyanosis Diabetic Foot Exam - Simple   Simple Foot Form Visual Inspection No deformities, no ulcerations, no other skin breakdown bilaterally: Yes Sensation Testing Intact to touch and monofilament testing bilaterally: Yes Pulse Check Posterior Tibialis and Dorsalis pulse intact bilaterally: Yes Comments     CMP Latest Ref Rng & Units 11/08/2017 09/06/2015 07/14/2015  Glucose 65 - 99 mg/dL 107(H) 100(H) 102(H)  BUN 6 - 20 mg/dL 16 16 14   Creatinine 0.76 - 1.27 mg/dL  1.26 1.10 1.14  Sodium 134 - 144 mmol/L 139 138 139  Potassium 3.5 - 5.2 mmol/L 4.4 3.9 4.0  Chloride 96 - 106 mmol/L 103 105 103  CO2 20 - 29 mmol/L 22 25 26   Calcium 8.7 - 10.2 mg/dL 9.4 9.1 9.0  Total Protein 6.0 - 8.3 g/dL - - -  Total Bilirubin 0.3 - 1.2 mg/dL - - -  Alkaline Phos 39 - 117 U/L - - -  AST 0 - 37 U/L - - -  ALT 0 - 53 U/L - - -  Lipid Panel  No results found for: CHOL, TRIG, HDL, CHOLHDL, VLDL, LDLCALC, LDLDIRECT  CBC    Component Value Date/Time   WBC 4.8 05/25/2015 2210   RBC 5.78 05/25/2015 2210   HGB 12.6 (L) 05/25/2015 2210   HCT 38.5 (L) 05/25/2015 2210   PLT 217 05/25/2015 2210   MCV 66.6 (L) 05/25/2015 2210   MCH 21.8 (L) 05/25/2015 2210   MCHC 32.7 05/25/2015 2210   RDW 16.3 (H) 05/25/2015 2210   LYMPHSABS 2.0 05/25/2015 2210   MONOABS 0.6 05/25/2015 2210   EOSABS 0.1 05/25/2015 2210   BASOSABS 0.0 05/25/2015 2210   EKG" normal sinus rhythm with some nonspecific T wave changes in the inferior leads  Results for orders placed or performed in visit on 12/30/18  POCT URINALYSIS DIP (CLINITEK)  Result Value Ref Range   Color, UA yellow yellow   Clarity, UA clear clear   Glucose, UA =500 (A) negative mg/dL   Bilirubin, UA negative negative   Ketones, POC UA trace (5) (A) negative mg/dL   Spec Grav, UA >=1.030 (A) 1.010 - 1.025   Blood, UA negative negative   pH, UA 5.5 5.0 - 8.0   POC PROTEIN,UA =100 (A) negative, trace   Urobilinogen, UA 0.2 0.2 or 1.0 E.U./dL   Nitrite, UA Negative Negative   Leukocytes, UA Negative Negative    ASSESSMENT AND PLAN: 1. Prediabetes Patient most likely has progressed to full-blown diabetes if he is spilling glucose in the urine.  We will check A1c and other labs today and further management will be based on results. Dietary counseling given.  Advised to eliminate sugary drinks from his diet.  Cut back on white carbohydrates and incorporate more fresh fruits and vegetables. Encouraged him to get in at  least about 150 minutes/week of moderate intensity exercise. - POCT URINALYSIS DIP (CLINITEK) - CBC - Comprehensive metabolic panel - Lipid panel - Hemoglobin A1c  2. Class 2 severe obesity due to excess calories with serious comorbidity and body mass index (BMI) of 37.0 to 37.9 in adult [facility]) See #1 above  3. Essential hypertension Not at goal.  Increase amlodipine to 10 mg daily.  DASH diet discussed and encouraged - amLODipine (NORVASC) 10 MG tablet; Take 1 tablet (10 mg total) by mouth daily.  Dispense: 30 tablet; Refill: 6 - Ambulatory referral to Cardiology  4. Chest pain, unspecified type Recommend that he sees Dr. Stanford Breed for further evaluation and to determine whether stress test is warranted.  I told him to mention to the cardiologist that the provider that did his.physical in [place] recommended that he have a stress test done. - EKG 12-Lead - Ambulatory referral to Cardiology  5. Muscle cramps Check electrolytes.  Pulses in the extremities today are 3+ so I doubt PAD  6. Influenza vaccination declined  7.  History does not suggest sleep apnea so will not order sleep study at this time.   I spent about 35 minutes with this patient in direct face-to-face evaluation discussing diagnoses, treatment and coordinating care. Patient was given the opportunity to ask questions.  Patient verbalized understanding of the plan and was able to repeat key elements of the plan.   Orders Placed This Encounter  Procedures  . CBC  . Comprehensive metabolic panel  . Lipid panel  . Hemoglobin A1c  . Ambulatory referral to Cardiology  . POCT URINALYSIS DIP (CLINITEK)  . EKG 12-Lead     Requested Prescriptions   Signed Prescriptions Disp Refills  . amLODipine (  NORVASC) 10 MG tablet 30 tablet 6    Sig: Take 1 tablet (10 mg total) by mouth daily.    Return in about 1 month (around 01/29/2019).  Karle Plumber, MD, FACP

## 2018-12-31 ENCOUNTER — Encounter: Payer: Self-pay | Admitting: Internal Medicine

## 2018-12-31 ENCOUNTER — Telehealth: Payer: Self-pay | Admitting: Internal Medicine

## 2018-12-31 DIAGNOSIS — E1129 Type 2 diabetes mellitus with other diabetic kidney complication: Secondary | ICD-10-CM

## 2018-12-31 DIAGNOSIS — E1169 Type 2 diabetes mellitus with other specified complication: Secondary | ICD-10-CM

## 2018-12-31 DIAGNOSIS — IMO0002 Reserved for concepts with insufficient information to code with codable children: Secondary | ICD-10-CM

## 2018-12-31 DIAGNOSIS — E785 Hyperlipidemia, unspecified: Secondary | ICD-10-CM | POA: Insufficient documentation

## 2018-12-31 HISTORY — DX: Reserved for concepts with insufficient information to code with codable children: IMO0002

## 2018-12-31 HISTORY — DX: Type 2 diabetes mellitus with other diabetic kidney complication: E11.29

## 2018-12-31 HISTORY — DX: Type 2 diabetes mellitus with other specified complication: E11.69

## 2018-12-31 LAB — LIPID PANEL
Chol/HDL Ratio: 5.1 ratio — ABNORMAL HIGH (ref 0.0–5.0)
Cholesterol, Total: 149 mg/dL (ref 100–199)
HDL: 29 mg/dL — ABNORMAL LOW (ref >39)
LDL Chol Calc (NIH): 100 mg/dL — ABNORMAL HIGH (ref 0–99)
Triglycerides: 107 mg/dL (ref 0–149)
VLDL Cholesterol Cal: 20 mg/dL (ref 5–40)

## 2018-12-31 LAB — CBC
Hematocrit: 42 % (ref 37.5–51.0)
Hemoglobin: 12.7 g/dL — ABNORMAL LOW (ref 13.0–17.7)
MCH: 20.7 pg — ABNORMAL LOW (ref 26.6–33.0)
MCHC: 30.2 g/dL — ABNORMAL LOW (ref 31.5–35.7)
MCV: 69 fL — ABNORMAL LOW (ref 79–97)
Platelets: 264 10*3/uL (ref 150–450)
RBC: 6.13 x10E6/uL — ABNORMAL HIGH (ref 4.14–5.80)
RDW: 17.3 % — ABNORMAL HIGH (ref 11.6–15.4)
WBC: 4.7 10*3/uL (ref 3.4–10.8)

## 2018-12-31 LAB — COMPREHENSIVE METABOLIC PANEL
ALT: 10 IU/L (ref 0–44)
AST: 14 IU/L (ref 0–40)
Albumin/Globulin Ratio: 1.4 (ref 1.2–2.2)
Albumin: 4.1 g/dL (ref 4.0–5.0)
Alkaline Phosphatase: 105 IU/L (ref 39–117)
BUN/Creatinine Ratio: 12 (ref 9–20)
BUN: 14 mg/dL (ref 6–24)
Bilirubin Total: <0.2 mg/dL (ref 0.0–1.2)
CO2: 21 mmol/L (ref 20–29)
Calcium: 9.3 mg/dL (ref 8.7–10.2)
Chloride: 102 mmol/L (ref 96–106)
Creatinine, Ser: 1.16 mg/dL (ref 0.76–1.27)
GFR calc Af Amer: 91 mL/min/{1.73_m2} (ref >59)
GFR calc non Af Amer: 78 mL/min/{1.73_m2} (ref >59)
Globulin, Total: 2.9 g/dL (ref 1.5–4.5)
Glucose: 258 mg/dL — ABNORMAL HIGH (ref 65–99)
Potassium: 4 mmol/L (ref 3.5–5.2)
Sodium: 136 mmol/L (ref 134–144)
Total Protein: 7 g/dL (ref 6.0–8.5)

## 2018-12-31 LAB — HEMOGLOBIN A1C
Est. average glucose Bld gHb Est-mCnc: 269 mg/dL
Hgb A1c MFr Bld: 11 % — ABNORMAL HIGH (ref 4.8–5.6)

## 2018-12-31 MED ORDER — METFORMIN HCL 500 MG PO TABS: 500.0000 mg | ORAL_TABLET | Freq: Two times a day (BID) | ORAL | 3 refills | Status: DC

## 2018-12-31 MED ORDER — LOVASTATIN 20 MG PO TABS: 20.0000 mg | ORAL_TABLET | Freq: Every day | ORAL | 3 refills | Status: DC

## 2018-12-31 MED ORDER — TRULICITY 0.75 MG/0.5ML ~~LOC~~ SOAJ: 0.7500 mg | SUBCUTANEOUS | 5 refills | Status: DC

## 2018-12-31 NOTE — Telephone Encounter (Signed)
Tried to reach pt twice this a.m to discuss lab results but got VM.  Did not leave message.  Will have CMA try to reach him later. Let pt know that labs confirm that he is diabetic. I would like to start him on two medications - oral med called Metformin and once weekly injection called Trulicity until we see him in f/u. I have sent rxns to his pharmacy.  Please get him in with [facility] within the next wk for teaching on how to do the Trulicity injections and basic DM teaching. Please figure out which DM testing supplies his insurance will cover and send rxn for meter, stripes and lancets for me to sign. Kidney and LFTs nl.  LDL cholesterol is 100 with goal in DM being less than 70.  Besides changing eating habits and regular exercise, I recommend starting med called Lipitor to help lower cholesterol and risk for heart attack and stroke. Rxn sent to his pharmacy.  He has a mild stable anemia compared to 3 yrs ago.  Will discuss further on his next visit.

## 2019-01-02 NOTE — Telephone Encounter (Signed)
Left voice mail to call back 

## 2019-01-07 ENCOUNTER — Telehealth: Payer: Self-pay | Admitting: General Practice

## 2019-01-07 MED ORDER — BLOOD GLUCOSE MONITOR KIT: PACK | 0 refills | Status: AC

## 2019-01-07 NOTE — Telephone Encounter (Signed)
Please see other telephone encounter.

## 2019-01-07 NOTE — Telephone Encounter (Signed)
Patient notified of lab results & recommendations. Expressed understanding. Patient states that [place] so I will send message to Lurena Joiner to have him schedule patient at his convenience.

## 2019-01-07 NOTE — Telephone Encounter (Signed)
Unable to find drug formulary because insurance was not scanned at visit. Called in a blood glucose meter kit with supplies to pharmacy on file. Advised them to dispense based off of insurance preference.

## 2019-01-07 NOTE — Telephone Encounter (Signed)
Patient calling for results.

## 2019-01-13 ENCOUNTER — Other Ambulatory Visit: Payer: Self-pay

## 2019-01-13 ENCOUNTER — Ambulatory Visit (HOSPITAL_BASED_OUTPATIENT_CLINIC_OR_DEPARTMENT_OTHER): Payer: 59 | Admitting: Pharmacist

## 2019-01-13 DIAGNOSIS — Z7189 Other specified counseling: Secondary | ICD-10-CM | POA: Diagnosis not present

## 2019-01-13 DIAGNOSIS — R7303 Prediabetes: Secondary | ICD-10-CM | POA: Diagnosis not present

## 2019-01-13 NOTE — Progress Notes (Signed)
I connected withWarren Little on 01/13/19 @ 1100 AM EDTby telephoneand verified that I am speaking with the correct person using two identifiers.  I discussed the limitations, risks, security and privacy concerns of performing an evaluation and management service by telephone and the availability of in person appointments. I also discussed with the patient that there may be a patient responsible charge related to this service. The patient expressed understanding and agreed to proceed.  Patient Location:car Provider Location:Office at [place]  Patient was educated on the use of the Trulicity pen. Reviewed necessary supplies and operation of the pen. Also reviewed goal blood glucose levels. Patient was able to demonstrate use. All questions and concerns were addressed.  Benard Halsted, PharmD, [place] [phone]

## 2019-01-19 ENCOUNTER — Ambulatory Visit: Payer: 59 | Admitting: Family Medicine

## 2019-01-19 ENCOUNTER — Other Ambulatory Visit: Payer: Self-pay

## 2019-01-19 ENCOUNTER — Encounter: Payer: Self-pay | Admitting: Family Medicine

## 2019-01-19 ENCOUNTER — Ambulatory Visit (INDEPENDENT_AMBULATORY_CARE_PROVIDER_SITE_OTHER): Payer: 59 | Admitting: Family Medicine

## 2019-01-19 VITALS — BP 142/94 | HR 71 | Temp 98.2°F | Ht 72.0 in | Wt 273.4 lb

## 2019-01-19 DIAGNOSIS — E1129 Type 2 diabetes mellitus with other diabetic kidney complication: Secondary | ICD-10-CM

## 2019-01-19 DIAGNOSIS — R809 Proteinuria, unspecified: Secondary | ICD-10-CM

## 2019-01-19 DIAGNOSIS — E1169 Type 2 diabetes mellitus with other specified complication: Secondary | ICD-10-CM | POA: Diagnosis not present

## 2019-01-19 DIAGNOSIS — Z23 Encounter for immunization: Secondary | ICD-10-CM | POA: Diagnosis not present

## 2019-01-19 DIAGNOSIS — E1165 Type 2 diabetes mellitus with hyperglycemia: Secondary | ICD-10-CM | POA: Diagnosis not present

## 2019-01-19 DIAGNOSIS — IMO0002 Reserved for concepts with insufficient information to code with codable children: Secondary | ICD-10-CM

## 2019-01-19 DIAGNOSIS — I10 Essential (primary) hypertension: Secondary | ICD-10-CM

## 2019-01-19 DIAGNOSIS — E785 Hyperlipidemia, unspecified: Secondary | ICD-10-CM

## 2019-01-19 LAB — BASIC METABOLIC PANEL
BUN: 16 mg/dL (ref 6–23)
CO2: 26 mEq/L (ref 19–32)
Calcium: 9.1 mg/dL (ref 8.4–10.5)
Chloride: 104 mEq/L (ref 96–112)
Creatinine, Ser: 1.06 mg/dL (ref 0.40–1.50)
GFR: 93.39 mL/min (ref >60.00)
Glucose, Bld: 114 mg/dL — ABNORMAL HIGH (ref 70–99)
Potassium: 3.8 mEq/L (ref 3.5–5.1)
Sodium: 138 mEq/L (ref 135–145)

## 2019-01-19 LAB — LIPID PANEL
Cholesterol: 137 mg/dL (ref 0–200)
HDL: 27.9 mg/dL — ABNORMAL LOW (ref >39.00)
LDL Cholesterol: 98 mg/dL (ref 0–99)
NonHDL: 109.12
Total CHOL/HDL Ratio: 5
Triglycerides: 55 mg/dL (ref 0.0–149.0)
VLDL: 11 mg/dL (ref 0.0–40.0)

## 2019-01-19 MED ORDER — METFORMIN HCL ER 750 MG PO TB24: 750.0000 mg | ORAL_TABLET | Freq: Every day | ORAL | 1 refills | Status: DC

## 2019-01-19 NOTE — Progress Notes (Signed)
Darren Little - 38 y.o. male MRN [id number]  Date of birth: November 02, 1980  Subjective Chief Complaint  Patient presents with  . Establish Care    physical    HPI DMARIUS Little is a 38 y.o. male here today for initial visit with new PCP.  He has history of metabolic syndrome with [id number], HTN and  HLD.    -T2DM:  He was diagnosed with diabetes fairly recently but has had prediabetes for several years.  His most recent a1c was 11.0%.  His current treatment includes metformin and trulicity.  He has only recently started with this but  is doing well. He does often forget his PM dose of metformin. He has a CDL and wants to avoid insulin.  He denies symptoms related to his diabetes and denies symptoms of hypoglycemia.  He is not checking glucose levels at home regularly.  He has never had any education regarding diabetes and doesn't feel confident about following a proper diabetic diet.    -HTN:  Current treatment with amlodipine, hctz and lisinopril.  He reports he is doing well with these current medications.  He is not checking BP at home.  He has not taken amlodipine yet today.  He denies chest pain, palpitations, headache or vision changes.    -HLD:  He is treated with lovastatin currently due to costs.  He would be open to trying something different if LDL is increasing in the future.  Lab Results  Component Value Date   LDLCALC 98 01/19/2019   ROS:  A comprehensive ROS was completed and negative except as noted per HPI  Allergies  Allergen Reactions  . Bee Venom Swelling  . Other Other (See Comments)    Squash- hives    Past Medical History:  Diagnosis Date  . Abnormal CT scan 08/2012   R axilla adenopathy  . 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 Abnormal RBC 09/26/12   Elliptocytes  . Hx of cardiovascular stress test    ETT-Myoview 7/14:  Normal, EF 54%, no ischemia  . Hypertension   . LVH (left ventricular hypertrophy)    08/2712 echo-EF 55-60%, moderate  LVH and mild LA dilatation.   . Microcytosis    MCV 65  . Mild sleep apnea    a. sleep study 9/14:  mild OSA, AHI 10/hour; O2 desat nadir 88% => recommend weight loss  . Prediabetes    A1C 6.1% 08/2012  . Uncontrolled type 2 diabetes mellitus with microalbuminuria ([place]) 12/31/2018    Past Surgical History:  Procedure Laterality Date  . HAND SURGERY Right 2000   Right hand, 5years ago    Social History   Socioeconomic History  . Marital status: Married    Spouse name: Not on file  . Number of children: 2  . Years of education: 12 grade  . Highest education level: Not on file  Occupational History  . Not on file  Social Needs  . Financial resource strain: Not on file  . Food insecurity    Worry: Not on file    Inability: Not on file  . Transportation needs    Medical: Not on file    Non-medical: Not on file  Tobacco Use  . Smoking status: Never Smoker  . Smokeless tobacco: Never Used  Substance and Sexual Activity  . Alcohol use: No  . Drug use: No  . Sexual activity: Not on file  Lifestyle  . Physical activity    Days per week:  Not on file    Minutes per session: Not on file  . Stress: Not on file  Relationships  . Social Herbalist on phone: Not on file    Gets together: Not on file    Attends religious service: Not on file    Active member of club or organization: Not on file    Attends meetings of clubs or organizations: Not on file    Relationship status: Not on file  Other Topics Concern  . Not on file  Social History Narrative  . Not on file    Family History  Problem Relation Age of Onset  . Heart disease Mother   . Hypertension Mother   . Hypertension Maternal Grandfather   . Heart attack Neg Hx     Health Maintenance  Topic Date Due  . PNEUMOCOCCAL POLYSACCHARIDE VACCINE AGE 36-64 HIGH RISK  07/15/1980  . FOOT EXAM  07/15/1988  . OPHTHALMOLOGY EXAM  07/15/1988  . HIV Screening  07/15/1993  . INFLUENZA VACCINE  07/01/2019  (Originally 11/01/2018)  . HEMOGLOBIN A1C  06/29/2019  . TETANUS/TDAP  06/08/2028    ----------------------------------------------------------------------------------------------------------------------------------------------------------------------------------------------------------------- Physical Exam BP (!) 142/94   Pulse 71   Temp 98.2 F (36.8 C) (Oral)   Ht 6' (1.829 m)   Wt 273 lb 6.4 oz (124 kg)   SpO2 99%   BMI 37.08 kg/m   Physical Exam Constitutional:      Appearance: Normal appearance.  HENT:     Head: Normocephalic and atraumatic.     Right Ear: Tympanic membrane normal.     Left Ear: Tympanic membrane normal.     Mouth/Throat:     Mouth: Mucous membranes are moist.  Eyes:     General: No scleral icterus. Neck:     Musculoskeletal: Neck supple.  Cardiovascular:     Rate and Rhythm: Normal rate and regular rhythm.  Pulmonary:     Effort: Pulmonary effort is normal.     Breath sounds: Normal breath sounds.  Skin:    General: Skin is warm and dry.  Neurological:     General: No focal deficit present.     Mental Status: He is alert.  Psychiatric:        Mood and Affect: Mood normal.        Behavior: Behavior normal.     ------------------------------------------------------------------------------------------------------------------------------------------------------------------------------------------------------------------- Assessment and Plan  Essential hypertension -BP is not at goal, however has not had daily dose of amlodipine.  -Discussed importance of taking medication at consistent time each day.   -Recommend low salt diet.     Uncontrolled type 2 diabetes mellitus with microalbuminuria (HCC) -Check fructosamine to assess recently glycemic control with current medications -Has fairly poor understanding of diabetes  -Counseled on low carb diet and regular exercise.  -Referral entered to diabetes education/nutrition.  -Change metformin  to extended release formulation for simplicity as patient is often missing PM dose.    Hyperlipidemia associated with type 2 diabetes mellitus ([place]) -Doing well with lovastatin, will continue for now.

## 2019-01-19 NOTE — Patient Instructions (Signed)
IT was very nice to meet you today.  I have entered a referral to a nutritionist.  Once out of metformin, start metformin ER once per day.   Continue trulicity.  Use the copay card to reduce the cost of this.  We'll be in touch with lab results.    Diabetes Mellitus and Nutrition, Adult When you have diabetes (diabetes mellitus), it is very important to have healthy eating habits because your blood sugar (glucose) levels are greatly affected by what you eat and drink. Eating healthy foods in the appropriate amounts, at about the same times every day, can help you:  Control your blood glucose.  Lower your risk of heart disease.  Improve your blood pressure.  Reach or maintain a healthy weight. Every person with diabetes is different, and each person has different needs for a meal plan. Your health care provider may recommend that you work with a diet and nutrition specialist (dietitian) to make a meal plan that is best for you. Your meal plan may vary depending on factors such as:  The calories you need.  The medicines you take.  Your weight.  Your blood glucose, blood pressure, and cholesterol levels.  Your activity level.  Other health conditions you have, such as heart or kidney disease. How do carbohydrates affect me? Carbohydrates, also called carbs, affect your blood glucose level more than any other type of food. Eating carbs naturally raises the amount of glucose in your blood. Carb counting is a method for keeping track of how many carbs you eat. Counting carbs is important to keep your blood glucose at a healthy level, especially if you use insulin or take certain oral diabetes medicines. It is important to know how many carbs you can safely have in each meal. This is different for every person. Your dietitian can help you calculate how many carbs you should have at each meal and for each snack. Foods that contain carbs include:  Bread, cereal, rice, pasta, and crackers.   Potatoes and corn.  Peas, beans, and lentils.  Milk and yogurt.  Fruit and juice.  Desserts, such as cakes, cookies, ice cream, and candy. How does alcohol affect me? Alcohol can cause a sudden decrease in blood glucose (hypoglycemia), especially if you use insulin or take certain oral diabetes medicines. Hypoglycemia can be a life-threatening condition. Symptoms of hypoglycemia (sleepiness, dizziness, and confusion) are similar to symptoms of having too much alcohol. If your health care provider says that alcohol is safe for you, follow these guidelines:  Limit alcohol intake to no more than 1 drink per day for nonpregnant women and 2 drinks per day for men. One drink equals 12 oz of beer, 5 oz of wine, or 1 oz of hard liquor.  Do not drink on an empty stomach.  Keep yourself hydrated with water, diet soda, or unsweetened iced tea.  Keep in mind that regular soda, juice, and other mixers may contain a lot of sugar and must be counted as carbs. What are tips for following this plan?  Reading food labels  Start by checking the serving size on the "Nutrition Facts" label of packaged foods and drinks. The amount of calories, carbs, fats, and other nutrients listed on the label is based on one serving of the item. Many items contain more than one serving per package.  Check the total grams (g) of carbs in one serving. You can calculate the number of servings of carbs in one serving by dividing the total  carbs by 15. For example, if a food has 30 g of total carbs, it would be equal to 2 servings of carbs.  Check the number of grams (g) of saturated and trans fats in one serving. Choose foods that have low or no amount of these fats.  Check the number of milligrams (mg) of salt (sodium) in one serving. Most people should limit total sodium intake to less than 2,300 mg per day.  Always check the nutrition information of foods labeled as "low-fat" or "nonfat". These foods may be higher in  added sugar or refined carbs and should be avoided.  Talk to your dietitian to identify your daily goals for nutrients listed on the label. Shopping  Avoid buying canned, premade, or processed foods. These foods tend to be high in fat, sodium, and added sugar.  Shop around the outside edge of the grocery store. This includes fresh fruits and vegetables, bulk grains, fresh meats, and fresh dairy. Cooking  Use low-heat cooking methods, such as baking, instead of high-heat cooking methods like deep frying.  Cook using healthy oils, such as olive, canola, or sunflower oil.  Avoid cooking with butter, cream, or high-fat meats. Meal planning  Eat meals and snacks regularly, preferably at the same times every day. Avoid going long periods of time without eating.  Eat foods high in fiber, such as fresh fruits, vegetables, beans, and whole grains. Talk to your dietitian about how many servings of carbs you can eat at each meal.  Eat 4-6 ounces (oz) of lean protein each day, such as lean meat, chicken, fish, eggs, or tofu. One oz of lean protein is equal to: ? 1 oz of meat, chicken, or fish. ? 1 egg. ?  cup of tofu.  Eat some foods each day that contain healthy fats, such as avocado, nuts, seeds, and fish. Lifestyle  Check your blood glucose regularly.  Exercise regularly as told by your health care provider. This may include: ? 150 minutes of moderate-intensity or vigorous-intensity exercise each week. This could be brisk walking, biking, or water aerobics. ? Stretching and doing strength exercises, such as yoga or weightlifting, at least 2 times a week.  Take medicines as told by your health care provider.  Do not use any products that contain nicotine or tobacco, such as cigarettes and e-cigarettes. If you need help quitting, ask your health care provider.  Work with a Social worker or diabetes educator to identify strategies to manage stress and any emotional and social challenges.  Questions to ask a health care provider  Do I need to meet with a diabetes educator?  Do I need to meet with a dietitian?  What number can I call if I have questions?  When are the best times to check my blood glucose? Where to find more information:  American Diabetes Association: diabetes.org  Academy of Nutrition and Dietetics: www.eatright.[employer] of Diabetes and Digestive and Kidney Diseases (NIH): [employer] Summary  A healthy meal plan will help you control your blood glucose and maintain a healthy lifestyle.  Working with a diet and nutrition specialist (dietitian) can help you make a meal plan that is best for you.  Keep in mind that carbohydrates (carbs) and alcohol have immediate effects on your blood glucose levels. It is important to count carbs and to use alcohol carefully. This information is not intended to replace advice given to you by your health care provider. Make sure you discuss any questions you have with your health  care provider. Document Released: 12/14/2004 Document Revised: 03/01/2017 Document Reviewed: 04/23/2016 Elsevier Patient Education  2020 [employer].

## 2019-01-22 NOTE — Assessment & Plan Note (Addendum)
-  Doing well with lovastatin, will continue for now.  -Update lipids

## 2019-01-22 NOTE — Assessment & Plan Note (Signed)
-  Check fructosamine to assess recently glycemic control with current medications -Has fairly poor understanding of diabetes  -Counseled on low carb diet and regular exercise.  -Referral entered to diabetes education/nutrition.  -Change metformin to extended release formulation for simplicity as patient is often missing PM dose.

## 2019-01-22 NOTE — Assessment & Plan Note (Signed)
-  BP is not at goal, however has not had daily dose of amlodipine.  -Discussed importance of taking medication at consistent time each day.   -Recommend low salt diet.

## 2019-01-25 LAB — FRUCTOSAMINE: Fructosamine: 324 umol/L — ABNORMAL HIGH (ref 205–285)

## 2019-01-27 ENCOUNTER — Ambulatory Visit: Payer: 59

## 2019-01-28 ENCOUNTER — Other Ambulatory Visit: Payer: Self-pay

## 2019-01-28 DIAGNOSIS — G4730 Sleep apnea, unspecified: Secondary | ICD-10-CM

## 2019-01-30 NOTE — Progress Notes (Signed)
    HPI: FU hypertension. Echocardiogram 09/26/12: Moderate LVH, EF 55-60%, normal diastolic function, mild LAE. ETT Myoview 7/14 demonstrated EF 54; no ischemia. Stress echocardiogram ordered at last office visit but not performed.  Since last seen,  denies dyspnea on exertion.  No pedal edema.  Patient continues to have occasional chest pain.  It is not clearly exertional.  It is described as spinning sensations and also occasional tightness.  Current Outpatient Medications  Medication Sig Dispense Refill  . Accu-Chek FastClix Lancets MISC USE 1 TO CHECK GLUCOSE TWICE DAILY    . ACCU-CHEK GUIDE test strip USE 1 TWICE DAILY    . amLODipine (NORVASC) 5 MG tablet Take 5 mg by mouth daily.    . blood glucose meter kit and supplies KIT Use to check FSBS BID. (Dx: E11.29, E11.65, R80.9). 1 each 0  . Dulaglutide (TRULICITY) 0.75 MG/0.5ML SOPN Inject 0.75 mg into the skin once a week. 4 pen 5  . EPINEPHrine (EPIPEN 2-PAK) 0.3 mg/0.3 mL IJ SOAJ injection Inject 0.3 mLs (0.3 mg total) into the muscle as needed for anaphylaxis. 2 each 0  . hydrochlorothiazide (MICROZIDE) 12.5 MG capsule Take 1 capsule (12.5 mg total) by mouth daily. 90 capsule 3  . lisinopril (PRINIVIL,ZESTRIL) 40 MG tablet Take 1 tablet (40 mg total) by mouth daily. 90 tablet 3  . lovastatin (MEVACOR) 20 MG tablet Take 1 tablet (20 mg total) by mouth at bedtime. 30 tablet 3  . metFORMIN (GLUCOPHAGE-XR) 750 MG 24 hr tablet Take 1 tablet (750 mg total) by mouth daily with breakfast. 90 tablet 1   No current facility-administered medications for this visit.      Past Medical History:  Diagnosis Date  . Abnormal CT scan 08/2012   R axilla adenopathy  . 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 Abnormal RBC 09/26/12   Elliptocytes  . Hx of cardiovascular stress test    ETT-Myoview 7/14:  Normal, EF 54%, no ischemia  . Hypertension   . LVH (left ventricular hypertrophy)    08/2712 echo-EF 55-60%, moderate LVH and mild  LA dilatation.   . Microcytosis    MCV 65  . Mild sleep apnea    a. sleep study 9/14:  mild OSA, AHI 10/hour; O2 desat nadir 88% => recommend weight loss  . Prediabetes    A1C 6.1% 08/2012  . Uncontrolled type 2 diabetes mellitus with microalbuminuria (HCC) 12/31/2018    Past Surgical History:  Procedure Laterality Date  . HAND SURGERY Right 2000   Right hand, 5years ago    Social History   Socioeconomic History  . Marital status: Married    Spouse name: Not on file  . Number of children: 2  . Years of education: 12 grade  . Highest education level: Not on file  Occupational History  . Not on file  Social Needs  . Financial resource strain: Not on file  . Food insecurity    Worry: Not on file    Inability: Not on file  . Transportation needs    Medical: Not on file    Non-medical: Not on file  Tobacco Use  . Smoking status: Never Smoker  . Smokeless tobacco: Never Used  Substance and Sexual Activity  . Alcohol use: No  . Drug use: No  . Sexual activity: Not on file  Lifestyle  . Physical activity    Days per week: Not on file    Minutes per session: Not on file  .   Stress: Not on file  Relationships  . Social connections    Talks on phone: Not on file    Gets together: Not on file    Attends religious service: Not on file    Active member of club or organization: Not on file    Attends meetings of clubs or organizations: Not on file    Relationship status: Not on file  . Intimate partner violence    Fear of current or ex partner: Not on file    Emotionally abused: Not on file    Physically abused: Not on file    Forced sexual activity: Not on file  Other Topics Concern  . Not on file  Social History Narrative  . Not on file    Family History  Problem Relation Age of Onset  . Heart disease Mother   . Hypertension Mother   . Hypertension Maternal Grandfather   . Heart attack Neg Hx     ROS: no fevers or chills, productive cough, hemoptysis,  dysphasia, odynophagia, melena, hematochezia, dysuria, hematuria, rash, seizure activity, orthopnea, PND, pedal edema, claudication. Remaining systems are negative.  Physical Exam: Well-developed well-nourished in no acute distress.  Skin is warm and dry.  HEENT is normal.  Neck is supple.  Chest is clear to auscultation with normal expansion.  Cardiovascular exam is regular rate and rhythm.  Abdominal exam nontender or distended. No masses palpated. Extremities show no edema. neuro grossly intact   A/P  1 hypertension-patient's blood pressure is controlled.  Continue present medications and follow.  2 history of atypical chest pain-patient continues to have occasional atypical chest pain.  We will arrange a CTA to further assess.   Brian Crenshaw, MD    

## 2019-02-02 ENCOUNTER — Ambulatory Visit (INDEPENDENT_AMBULATORY_CARE_PROVIDER_SITE_OTHER): Payer: 59 | Admitting: Cardiology

## 2019-02-02 ENCOUNTER — Other Ambulatory Visit: Payer: Self-pay

## 2019-02-02 ENCOUNTER — Encounter: Payer: Self-pay | Admitting: *Deleted

## 2019-02-02 ENCOUNTER — Encounter: Payer: Self-pay | Admitting: Cardiology

## 2019-02-02 VITALS — BP 120/80 | HR 70 | Ht 72.0 in | Wt 270.4 lb

## 2019-02-02 DIAGNOSIS — R0789 Other chest pain: Secondary | ICD-10-CM

## 2019-02-02 DIAGNOSIS — I10 Essential (primary) hypertension: Secondary | ICD-10-CM

## 2019-02-02 MED ORDER — METOPROLOL TARTRATE 100 MG PO TABS: ORAL_TABLET | ORAL | 0 refills | Status: DC

## 2019-02-02 NOTE — Patient Instructions (Signed)
Medication Instructions:  NO CHANGE *If you need a refill on your cardiac medications before your next appointment, please call your pharmacy*  Lab Work: If you have labs (blood work) drawn today and your tests are completely normal, you will receive your results only by: Marland Kitchen MyChart Message (if you have MyChart) OR . A paper copy in the mail If you have any lab test that is abnormal or we need to change your treatment, we will call you to review the results.  Testing/Procedures: Your cardiac CT will be scheduled at one of the below locations:   [facility] [address] [place], [place] [address] [phone]  If scheduled at [facility], please arrive at the [facility] main entrance of [facility] 30-45 minutes prior to test start time. Proceed to the [facility] Radiology Department (first floor) to check-in and test prep.  Please follow these instructions carefully (unless otherwise directed):  Hold all erectile dysfunction medications at least 3 days (72 hrs) prior to test.  On the Night Before the Test: . Be sure to Drink plenty of water. . Do not consume any caffeinated/decaffeinated beverages or chocolate 12 hours prior to your test. . Do not take any antihistamines 12 hours prior to your test.   On the Day of the Test: . Drink plenty of water. Do not drink any water within one hour of the test. . Do not eat any food 4 hours prior to the test. . You may take your regular medications prior to the test.  . Take metoprolol (Lopressor) 100 MG two hours prior to test. . HOLD Furosemide/Hydrochlorothiazide morning of the test. . FEMALES- please wear underwire-free bra if available      After the Test: . Drink plenty of water. . After receiving IV contrast, you may experience a mild flushed feeling. This is normal. . On occasion, you may experience a mild rash up to 24 hours after the test. This is not dangerous. If this occurs, you can take  Benadryl 25 mg and increase your fluid intake. . If you experience trouble breathing, this can be serious. If it is severe call 911 IMMEDIATELY. If it is mild, please call our office. . If you take any of these medications: Glipizide/Metformin, Avandament, Glucavance, please do not take 48 hours after completing test unless otherwise instructed.   Once we have confirmed authorization from your insurance company, we will call you to set up a date and time for your test.   For non-scheduling related questions, please contact the cardiac imaging nurse navigator should you have any questions/concerns: Marchia Bond, RN Navigator Cardiac Imaging [facility] [phone] Office     Follow-Up: At [facility], you and your health needs are our priority.  As part of our continuing mission to provide you with exceptional heart care, we have created designated Provider Care Teams.  These Care Teams include your primary Cardiologist (physician) and Advanced Practice Providers (APPs -  Physician Assistants and Nurse Practitioners) who all work together to provide you with the care you need, when you need it.  Your next appointment:   12 months  The format for your next appointment:   In Person  Provider:   Kirk Ruths, MD

## 2019-02-09 ENCOUNTER — Telehealth: Payer: Self-pay | Admitting: Family Medicine

## 2019-02-09 NOTE — Telephone Encounter (Signed)
Medication: Alternative medication for  Trulicity (Patient is requesting that an alternative medication be sent to pharmacy due to insurance not covering the prescribed medication. Patient would like a callback in regards to new medication being sent to pharmacy. Please advise.)

## 2019-02-09 NOTE — Telephone Encounter (Signed)
Copied from [place] [phone]. Topic: Quick Communication - Rx Refill/Question >> Feb 09, 2019  9:17 AM Rainey Pines A wrote: Medication: Alternative medication for  Trulicity (Patient is requesting that an alternative medication be sent to pharmacy due to insurance not covering the prescribed medication. Patient would like a callback in regards to new medication being sent to pharmacy. Please advise.)  Has the patient contacted their pharmacy? Yes (Agent: If no, request that the patient contact the pharmacy for the refill.) (Agent: If yes, when and what did the pharmacy advise?)Contact PCP  Preferred Pharmacy (with phone number or street name): [facility] ([address]), [place] [id number] (Phone) [phone] (Fax)    Agent: Please be advised that RX refills may take up to 3 business days. We ask that you follow-up with your pharmacy.

## 2019-02-11 ENCOUNTER — Other Ambulatory Visit: Payer: Self-pay | Admitting: Family Medicine

## 2019-02-11 MED ORDER — OZEMPIC (0.25 OR 0.5 MG/DOSE) 2 MG/1.5ML ~~LOC~~ SOPN: 0.5000 mg | PEN_INJECTOR | SUBCUTANEOUS | 3 refills | Status: DC

## 2019-02-11 NOTE — Telephone Encounter (Signed)
Rx for ozempic sent in

## 2019-02-12 ENCOUNTER — Telehealth: Payer: Self-pay

## 2019-02-12 NOTE — Telephone Encounter (Signed)
Trulicity is not preferred on pt's ins.  Victoza is preferred-if appropriate can you change therapy and send script to [facility]?

## 2019-02-15 ENCOUNTER — Other Ambulatory Visit: Payer: Self-pay | Admitting: Cardiology

## 2019-02-15 DIAGNOSIS — I10 Essential (primary) hypertension: Secondary | ICD-10-CM

## 2019-02-16 ENCOUNTER — Ambulatory Visit (INDEPENDENT_AMBULATORY_CARE_PROVIDER_SITE_OTHER): Payer: 59 | Admitting: Neurology

## 2019-02-16 ENCOUNTER — Other Ambulatory Visit: Payer: Self-pay

## 2019-02-16 ENCOUNTER — Encounter: Payer: Self-pay | Admitting: Neurology

## 2019-02-16 VITALS — BP 144/95 | HR 76 | Temp 97.9°F | Ht 73.5 in | Wt 266.0 lb

## 2019-02-16 DIAGNOSIS — R718 Other abnormality of red blood cells: Secondary | ICD-10-CM | POA: Diagnosis not present

## 2019-02-16 DIAGNOSIS — D691 Qualitative platelet defects: Secondary | ICD-10-CM | POA: Diagnosis not present

## 2019-02-16 DIAGNOSIS — IMO0002 Reserved for concepts with insufficient information to code with codable children: Secondary | ICD-10-CM

## 2019-02-16 DIAGNOSIS — R809 Proteinuria, unspecified: Secondary | ICD-10-CM

## 2019-02-16 DIAGNOSIS — E1165 Type 2 diabetes mellitus with hyperglycemia: Secondary | ICD-10-CM

## 2019-02-16 DIAGNOSIS — E1129 Type 2 diabetes mellitus with other diabetic kidney complication: Secondary | ICD-10-CM | POA: Diagnosis not present

## 2019-02-16 DIAGNOSIS — I10 Essential (primary) hypertension: Secondary | ICD-10-CM

## 2019-02-16 DIAGNOSIS — Z024 Encounter for examination for driving license: Secondary | ICD-10-CM

## 2019-02-16 NOTE — Progress Notes (Signed)
SLEEP MEDICINE CLINIC    Provider:  Larey Seat, MD  Primary Care Physician:  Luetta Nutting, DO [place] [address]     Referring Provider: Luetta Nutting, Do [address] [place],  [place] [address]          Chief Complaint according to patient   Patient presents with:    . New Patient (Initial Visit)           HISTORY OF PRESENT ILLNESS:  Darren Little is a 38 y.o. year old 39 or [place] American male patient seen here as a referral on 02/16/2019 from  for a DOT related sleep test.  Chief concern according to patient : " I have no symptoms of apnea, not being sleepy, no snoring"   I have the pleasure of seeing Darren Little today, a right-handed [place] or [place] American male with a possible sleep disorder.  He has a past medical history of  Abnormal platelets ([facility]) (09/26/12), Abnormal RBC (09/26/12), cardiovascular stress test, Hypertension, LVH (left ventricular hypertrophy), Microcytosis, Mild sleep apnea, Prediabetes, and Uncontrolled type 2 diabetes mellitus with microalbuminuria ([facility]) (12/31/2018), obesity. He lost wright over the last 3 weeks,    The patient had the first sleep study in the year 2010 without a diagnosis of apnea.     Sleep relevant medical history: Nocturia - once.     Family medical /sleep history: No other family member on CPAP with OSA.   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 Family status is wife , with 2 sons.  The patient currently works as a Geophysicist/field seismologist, nights and days. Pets are not present. Tobacco use_ none .  ETOH use ; none . Caffeine intake in form of Coffee( none ) Soda( rare ) Tea ( none  or energy drinks.      Sleep habits are as follows: The patient's dinner time is between 6 PM. The patient goes to bed depending on the assignments of the day- and continues to sleep for several hours, wakes for one time  bathroom break,  The preferred sleep position is not reported, with the support of 1 pillow.  Dreams are reportedly frequent.   He  reports  feeling refreshed / restored at 2  AM, without symptoms such as dry mouth, no morning headaches. He reports having medication side effects on metformin.  Naps are taken in frequently.   Review of Systems: Out of a complete 14 system review, the patient complains of only the following symptoms, and all other reviewed systems are negative.:  Fatigue, sleepiness , snoring, fragmented sleep, no insomnia.     How likely are you to doze in the following situations: 0 = not likely, 1 = slight chance, 2 = moderate chance, 3 = high chance   Sitting and Reading? Watching Television? Sitting inactive in a public place (theater or meeting)? As a passenger in a car for an hour without a break? Lying down in the afternoon when circumstances permit? Sitting and talking to someone? Sitting quietly after lunch without alcohol? In a car, while stopped for a few minutes in traffic?   Total = 0/ 24 points   FSS endorsed at NA/ 63 points.   Social History   Socioeconomic History  . Marital status: Married    Spouse name: Not on file  . Number of children: 2  . Years of education: 12 grade  .  Highest education level: Not on file  Occupational History  . Not on file  Social Needs  . Financial resource strain: Not on file  . Food insecurity    Worry: Not on file    Inability: Not on file  . Transportation needs    Medical: Not on file    Non-medical: Not on file  Tobacco Use  . Smoking status: Never Smoker  . Smokeless tobacco: Never Used  Substance and Sexual Activity  . Alcohol use: No  . Drug use: No  . Sexual activity: Not on file  Lifestyle  . Physical activity    Days per week: Not on file    Minutes per session: Not on file  . Stress: Not on file  Relationships  . Social Herbalist on phone: Not on file    Gets together: Not on file     Attends religious service: Not on file    Active member of club or organization: Not on file    Attends meetings of clubs or organizations: Not on file    Relationship status: Not on file  Other Topics Concern  . Not on file  Social History Narrative  . Not on file    Family History  Problem Relation Age of Onset  . Heart disease Mother   . Hypertension Mother   . Hypertension Maternal Grandfather   . Heart attack Neg Hx     Past Medical History:  Diagnosis Date  . Abnormal CT scan 08/2012   R axilla adenopathy  . 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 Abnormal RBC 09/26/12   Elliptocytes  . Hx of cardiovascular stress test    ETT-Myoview 7/14:  Normal, EF 54%, no ischemia  . Hypertension   . LVH (left ventricular hypertrophy)    08/2712 echo-EF 55-60%, moderate LVH and mild LA dilatation.   . Microcytosis    MCV 65  . Mild sleep apnea    a. sleep study 9/14:  mild OSA, AHI 10/hour; O2 desat nadir 88% => recommend weight loss  . Prediabetes    A1C 6.1% 08/2012  . Uncontrolled type 2 diabetes mellitus with microalbuminuria ([facility]) 12/31/2018    Past Surgical History:  Procedure Laterality Date  . HAND SURGERY Right 2000   Right hand, 5years ago     Current Outpatient Medications on File Prior to Visit  Medication Sig Dispense Refill  . Accu-Chek FastClix Lancets MISC USE 1 TO CHECK GLUCOSE TWICE DAILY    . ACCU-CHEK GUIDE test strip USE 1 TWICE DAILY    . amLODipine (NORVASC) 10 MG tablet Take 10 mg by mouth daily.     . blood glucose meter kit and supplies KIT Use to check FSBS BID. (Dx: E11.29, E11.65, R80.9). 1 each 0  . Dulaglutide (TRULICITY) 6.54 YT/0.3TW SOPN Inject 0.75 mg into the skin once a week. 4 pen 5  . EPINEPHrine (EPIPEN 2-PAK) 0.3 mg/0.3 mL IJ SOAJ injection Inject 0.3 mLs (0.3 mg total) into the muscle as needed for anaphylaxis. 2 each 0  . hydrochlorothiazide (MICROZIDE) 12.5 MG capsule Take 1 capsule (12.5 mg total) by mouth daily. 90  capsule 3  . lisinopril (PRINIVIL,ZESTRIL) 40 MG tablet Take 1 tablet (40 mg total) by mouth daily. 90 tablet 3  . lovastatin (MEVACOR) 20 MG tablet Take 1 tablet (20 mg total) by mouth at bedtime. 30 tablet 3  . metFORMIN (GLUCOPHAGE) 500 MG tablet Take 500 mg by mouth 2 (two)  times daily.    . metFORMIN (GLUCOPHAGE-XR) 750 MG 24 hr tablet Take 1 tablet (750 mg total) by mouth daily with breakfast. 90 tablet 1  . metoprolol tartrate (LOPRESSOR) 100 MG tablet TAKE 2 HOURS PRIOR TO SCAN 1 tablet 0  . Semaglutide,0.25 or 0.5MG/DOS, (OZEMPIC, 0.25 OR 0.5 MG/DOSE,) 2 MG/1.5ML SOPN Inject 0.5 mg into the skin once a week. Inject 0.22m into the skin weekly x4 weeks then increase to 0.535mweekly. 4 pen 3   No current facility-administered medications on file prior to visit.     Allergies  Allergen Reactions  . Bee Venom Swelling  . Other Other (See Comments)    Squash- hives    Physical exam:  Today's Vitals   02/16/19 1001  BP: (!) 144/95  Pulse: 76  Temp: 97.9 F (36.6 C)  Weight: 266 lb (120.7 kg)  Height: 6' 1.5" (1.867 m)   Body mass index is 34.62 kg/m.   Wt Readings from Last 3 Encounters:  02/16/19 266 lb (120.7 kg)  02/02/19 270 lb 6.4 oz (122.7 kg)  01/19/19 273 lb 6.4 oz (124 kg)     Ht Readings from Last 3 Encounters:  02/16/19 6' 1.5" (1.867 m)  02/02/19 6' (1.829 m)  01/19/19 6' (1.829 m)      General: The patient is awake, alert and appears not in acute distress. The patient is well groomed. Head: Normocephalic, atraumatic. Neck is supple. Mallampati 3,  neck circumference:18 inches . Nasal airflow  patent.  Retrognathia is  seen.  Dental status:  Cardiovascular:  Regular rate and cardiac rhythm by pulse,  without distended neck veins. Respiratory: Lungs are clear to auscultation.  Skin:  Without evidence of ankle edema, or rash. Trunk: The patient's posture is erect.   Neurologic exam : The patient is awake and alert, oriented to place and time.    Memory subjective described as intact.  Attention span & concentration ability appears normal.  Speech is fluent,  without  dysarthria, dysphonia or aphasia.  Mood and affect are appropriate.   Cranial nerves: no loss of smell or taste reported  Pupils are equal and briskly reactive to light. Funduscopic exam deferred.  Extraocular movements in vertical and horizontal planes were intact and without nystagmus. No Diplopia. Visual fields by finger perimetry are intact. Hearing was intact to soft voice and finger rubbing. Facial sensation intact to fine touch. Facial motor strength is symmetric and tongue and uvula move midline.  Neck ROM : rotation, tilt and flexion extension were normal for age and shoulder shrug was symmetrical.    Motor exam:  Symmetric bulk, tone and ROM.   Normal tone without cog wheeling, symmetric grip strength . Strong grip.    Sensory:  Fine touch, pinprick and vibration were tested  and  normal.  Proprioception tested in the upper extremities was normal.   Coordination: Rapid alternating movements in the fingers/hands were of normal speed.  The Finger-to-nose maneuver was intact without evidence of ataxia, dysmetria or tremor.   Gait and station: Patient could rise unassisted from a seated position, walked without assistive device.  Toe and heel walk were deferred.  Deep tendon reflexes: in the  upper and lower extremities are symmetric and intact.  Babinski response was deferred .       After spending a total time of  35 minutes face to face and additional time for physical and neurologic examination, review of laboratory studies,  personal review of imaging studies, reports and results of  other testing and review of referral information / records as far as provided in visit, I have established the following assessments:  1) Mr. Demeyer reports not snoring, not having apnea and no EDS- he is frustrated to have to be here.   2) he doubts his diagnosis of DM  , insists it was a mistake.   3) he lost weight in order to get rid of the diagnosis of DM- .    My Plan is to proceed with:  1) Home sleep test.  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   I would like to thank Luetta Nutting, DO and Luetta Nutting, Do [place],  [place] [address] for allowing me to meet with and to take care of this pleasant patient.   I plan to follow up either personally or through our NP within 2 month.   CC: I will share my notes with PCP.  Electronically signed by: Larey Seat, MD 02/16/2019 10:19 AM  [facility] Board certified by The [employer] of Sleep Medicine and Diplomate of the [employer] of Sleep Medicine. Board certified In Neurology through the [employer], Fellow of the [employer] of Neurology. Medical Director of [facility].

## 2019-02-16 NOTE — Patient Instructions (Signed)

## 2019-02-18 ENCOUNTER — Other Ambulatory Visit: Payer: Self-pay | Admitting: Family Medicine

## 2019-02-18 MED ORDER — AMLODIPINE BESYLATE 10 MG PO TABS: 10.0000 mg | ORAL_TABLET | Freq: Every day | ORAL | 1 refills | Status: DC

## 2019-02-18 NOTE — Telephone Encounter (Signed)
Requested medication (s) are due for refill today: yes  Requested medication (s) are on the active medication list: yes  Last refill: 02/02/2019  Future visit scheduled:no  Notes to clinic: historical provider    Requested Prescriptions  Pending Prescriptions Disp Refills   amLODipine (NORVASC) 10 MG tablet       Sig: Take 1 tablet (10 mg total) by mouth daily.     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

## 2019-02-22 ENCOUNTER — Other Ambulatory Visit: Payer: Self-pay | Admitting: Internal Medicine

## 2019-03-07 ENCOUNTER — Ambulatory Visit (INDEPENDENT_AMBULATORY_CARE_PROVIDER_SITE_OTHER): Payer: 59 | Admitting: Neurology

## 2019-03-07 DIAGNOSIS — E1129 Type 2 diabetes mellitus with other diabetic kidney complication: Secondary | ICD-10-CM

## 2019-03-07 DIAGNOSIS — R718 Other abnormality of red blood cells: Secondary | ICD-10-CM

## 2019-03-07 DIAGNOSIS — G4733 Obstructive sleep apnea (adult) (pediatric): Secondary | ICD-10-CM

## 2019-03-07 DIAGNOSIS — Z024 Encounter for examination for driving license: Secondary | ICD-10-CM

## 2019-03-07 DIAGNOSIS — I10 Essential (primary) hypertension: Secondary | ICD-10-CM

## 2019-03-07 DIAGNOSIS — IMO0002 Reserved for concepts with insufficient information to code with codable children: Secondary | ICD-10-CM

## 2019-03-07 DIAGNOSIS — D691 Qualitative platelet defects: Secondary | ICD-10-CM

## 2019-03-16 ENCOUNTER — Other Ambulatory Visit: Payer: Self-pay | Admitting: *Deleted

## 2019-03-16 ENCOUNTER — Telehealth: Payer: Self-pay | Admitting: Neurology

## 2019-03-16 DIAGNOSIS — Z114 Encounter for screening for human immunodeficiency virus [HIV]: Secondary | ICD-10-CM

## 2019-03-16 DIAGNOSIS — R0789 Other chest pain: Secondary | ICD-10-CM

## 2019-03-16 DIAGNOSIS — Z024 Encounter for examination for driving license: Secondary | ICD-10-CM | POA: Insufficient documentation

## 2019-03-16 HISTORY — DX: Encounter for screening for human immunodeficiency virus (HIV): Z11.4

## 2019-03-16 NOTE — Telephone Encounter (Signed)
-----   Message from Larey Seat, MD sent at 03/16/2019 10:02 AM EST ----- Summary & Diagnosis:   Moderate Sleep apnea at AHI of 23.6/h, not accentuated in REM  sleep. RDI is higher than AHI, indicating snoring activity. AHI  and snoring both are strongly dependent on supine sleep position.   There was no hypoxia and no tachy-bradycardia associated with  sleep apnea.   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 I recommend a setting of auto- titration CPAP between 6-16  cm water pressure and 1 cm EPR and mask of his choice and  comfort.

## 2019-03-16 NOTE — Telephone Encounter (Signed)
I called pt. I advised pt that Dr. Brett Fairy reviewed their sleep study results and found that pt has moderate sleep apnea. Dr. Brett Fairy recommends that pt should start auto CPAP 6-16. I reviewed PAP compliance expectations with the pt. Pt is agreeable to starting a CPAP. I advised pt that an order will be sent to a DME, [facility], and [facility] will call the pt within about one week after they file with the pt's insurance. [facility] will show the pt how to use the machine, fit for masks, and troubleshoot the CPAP if needed. A follow up appt will need to made for insurance purposes with Dr. Brett Fairy or NP 31-90 days after starting the machine Pt verbalized understanding to arrive 15 minutes early and bring their CPAP. A letter with all of this information in it will be mailed to the pt as a reminder. I verified with the pt that the address we have on file is correct. Pt verbalized understanding of results. Pt had no questions at this time but was encouraged to call back if questions arise. I have sent the order to [facility] and have received confirmation that they have received the order.  **pt is coming to pick sleep study up in the office and states he will schedule the apt at that time. Please schedule around end of Feb/1st of march with NP (Megan M or Amy L) or Dr Brett Fairy.

## 2019-03-16 NOTE — Progress Notes (Signed)
Summary & Diagnosis:   Moderate Sleep apnea at AHI of 23.6/h, not accentuated in REM  sleep. RDI is higher than AHI, indicating snoring activity. AHI  and snoring both are strongly dependent on supine sleep position.   There was no hypoxia and no tachy-bradycardia associated with  sleep apnea.   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 I recommend a setting of auto- titration CPAP between 6-16  cm water pressure and 1 cm EPR and mask of his choice and  comfort.

## 2019-03-16 NOTE — Telephone Encounter (Signed)
Called pt and reviewed.

## 2019-03-16 NOTE — Telephone Encounter (Signed)
Pt completed HST on 03/07/19. Dr Brett Fairy has not read his study yet. We can typically take up to 2 wks sometimes to get results back. 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

## 2019-03-16 NOTE — Addendum Note (Signed)
Addended by: Larey Seat on: 03/16/2019 10:02 AM   Modules accepted: Orders

## 2019-03-16 NOTE — Procedures (Signed)
Patient Information     First Name: Darren Last Name: Little ID: [id number]  Birth Date: 05-24-1980 Age: 38 Gender: Male  Referring Provider: Rosanne Gutting:                     BMI:   34.2                                                                                                                                  Neck Circ.:  18 ''    Sleep Study Information    Study Date: Mar 08, 2019 S/H/A Version: 001.001.001.001 / 4.1.1528 / 59  History:    [name] is a 38 y.o. year old 67 or [place] American male patient seen here as a referral on 02/16/2019 for a DOT related sleep test.  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 He lost weight over the last 3 weeks. The patient had his first sleep study in the year 2010, at the time without a diagnosis of apnea.     Summary & Diagnosis:    Moderate Sleep apnea at AHI of 23.6/h, not accentuated in REM sleep. RDI is higher than AHI, indicating snoring activity. AHI and snoring both are strongly dependent on supine sleep position.  There was no hypoxia and no tachy-bradycardia associated with sleep apnea.   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 I recommend a setting of auto- titration CPAP between 6-16 cm water pressure and 1 cm EPR and mask of his choice and comfort.   Interpreting Physician: Larey Seat, MD              Sleep Summary  Oxygen Saturation Statistics   Start Study Time: End Study Time: Total Recording Time:  1:08:49 AM  7:57:59 AM 6 hrs, 15min  Total Sleep Time % REM of Sleep Time:  6 hrs, 19 min 20.3    Mean: 96 Minimum: 86 Maximum: 99  Mean of Desaturations Nadirs (%):   93  Oxygen Desatur. %:   4-9 10-20 >20 Total  Events Number Total    67  1 98.5 1.5  0 0.0  68 100.0  Oxygen Saturation: <90 <=88 <85 <80 <70  Duration (minutes): Sleep % 0.0 0.0  0.0 0.0  0.0 0.0 0.0 0.0 0.0 0.0     Respiratory Indices      Total Events REM NREM All Night  pRDI:  155  pAHI:  144 ODI:  68  pAHIc:  9  % CSR: 0.0 19.8 18.9 4.9 0.0 26.8 24.8 12.7 1.8 25.4 23.6 11.2 1.5       Pulse Rate Statistics during Sleep (BPM)      Mean:  66 Minimum: 50 Maximum: 93    Indices are calculated using technically valid sleep time of  6 h, 5 min. pRDI/pAHI are calculated using o2 desaturations ? 3% Sit Body Position Statistics  Position Supine Prone Right Left Non-Supine  Sleep (min) 86.7 51.5 200.0 41.0 292.5  Sleep % 22.9 13.6 52.7 10.8 77.1  pRDI 52.0 13.8 16.9 26.8 17.8  pAHI 50.6 13.8 14.1 26.8 15.9  ODI 34.4 6.3 2.8 10.4 4.4     Snoring Statistics Snoring Level (dB) >40 >50 >60 >70 >80 >Threshold (45)  Sleep (min) 18.7 2.5 0.9 0.0 0.0 4.4  Sleep % 4.9 0.7 0.2 0.0 0.0 1.2    Mean: 40 dB Sleep Stages Chart                                    pAHI=23.6                                                Mild              Moderate                    Severe

## 2019-03-16 NOTE — Telephone Encounter (Signed)
Pt called wanting to know if his sleep test results are in and if he can pick up a copy of the results so that he can take them to work. Please advise.

## 2019-04-06 NOTE — Telephone Encounter (Signed)
Received notification from [place] in regards to the patient.  "So FYI, we got an order for auto-CPAP and supplies for this pt on 03/17/19. He lost his insurance on 03/02/19 and was not aware of it, so he delayed being set up. Pt came in today having purchase a machine from a pawn shop - it's an old [place] One unit that can do an auto-PAP trial for 30 days, but then MUST have a pressure set. Wanted to make you aware - his machine cannot be monitored remotely, he has an SD card that needs to be manually downloaded. Let me know if you need anything else from me!"

## 2019-04-07 ENCOUNTER — Other Ambulatory Visit: Payer: Self-pay | Admitting: Internal Medicine

## 2019-04-17 ENCOUNTER — Telehealth (HOSPITAL_COMMUNITY): Payer: Self-pay | Admitting: Emergency Medicine

## 2019-04-17 NOTE — Telephone Encounter (Signed)
unable to leave VM (full)

## 2019-04-20 ENCOUNTER — Ambulatory Visit (HOSPITAL_COMMUNITY)
Admission: RE | Admit: 2019-04-20 | Discharge: 2019-04-20 | Disposition: A | Discharge status: Home / Self Care | Payer: 59 | Source: Ambulatory Visit | Attending: Cardiology | Admitting: Cardiology

## 2019-04-20 MED ORDER — METOPROLOL TARTRATE 5 MG/5ML IV SOLN: 5.0000 mg | INTRAVENOUS | Status: DC | PRN

## 2019-04-20 MED ORDER — NITROGLYCERIN 0.4 MG SL SUBL: 0.8000 mg | SUBLINGUAL_TABLET | SUBLINGUAL | Status: DC | PRN

## 2019-04-29 ENCOUNTER — Encounter (HOSPITAL_COMMUNITY): Payer: Self-pay | Admitting: Emergency Medicine

## 2019-04-29 ENCOUNTER — Emergency Department (HOSPITAL_COMMUNITY): Payer: Worker's Compensation

## 2019-04-29 ENCOUNTER — Emergency Department (HOSPITAL_COMMUNITY)
Admission: EM | Admit: 2019-04-29 | Discharge: 2019-04-29 | Disposition: A | Discharge status: Home / Self Care | Payer: Worker's Compensation | Attending: Emergency Medicine | Admitting: Emergency Medicine

## 2019-04-29 DIAGNOSIS — S59911A Unspecified injury of right forearm, initial encounter: Secondary | ICD-10-CM | POA: Diagnosis present

## 2019-04-29 DIAGNOSIS — I10 Essential (primary) hypertension: Secondary | ICD-10-CM | POA: Diagnosis not present

## 2019-04-29 DIAGNOSIS — Y990 Civilian activity done for income or pay: Secondary | ICD-10-CM | POA: Insufficient documentation

## 2019-04-29 DIAGNOSIS — Z7984 Long term (current) use of oral hypoglycemic drugs: Secondary | ICD-10-CM | POA: Diagnosis not present

## 2019-04-29 DIAGNOSIS — Y929 Unspecified place or not applicable: Secondary | ICD-10-CM | POA: Diagnosis not present

## 2019-04-29 DIAGNOSIS — S41111A Laceration without foreign body of right upper arm, initial encounter: Secondary | ICD-10-CM

## 2019-04-29 DIAGNOSIS — S51811A Laceration without foreign body of right forearm, initial encounter: Secondary | ICD-10-CM | POA: Insufficient documentation

## 2019-04-29 DIAGNOSIS — Z23 Encounter for immunization: Secondary | ICD-10-CM | POA: Insufficient documentation

## 2019-04-29 DIAGNOSIS — W228XXA Striking against or struck by other objects, initial encounter: Secondary | ICD-10-CM | POA: Insufficient documentation

## 2019-04-29 DIAGNOSIS — Z79899 Other long term (current) drug therapy: Secondary | ICD-10-CM | POA: Insufficient documentation

## 2019-04-29 DIAGNOSIS — Y9389 Activity, other specified: Secondary | ICD-10-CM | POA: Insufficient documentation

## 2019-04-29 MED ORDER — CEPHALEXIN 500 MG PO CAPS: 500.0000 mg | ORAL_CAPSULE | Freq: Two times a day (BID) | ORAL | 0 refills | Status: AC

## 2019-04-29 MED ORDER — TETANUS-DIPHTH-ACELL PERTUSSIS 5-2.5-18.5 LF-MCG/0.5 IM SUSP
0.5000 mL | Freq: Once | INTRAMUSCULAR | Status: AC
  Administered 2019-04-29: 0.5 mL via INTRAMUSCULAR
  Filled 2019-04-29: qty 0.5

## 2019-04-29 MED ORDER — LIDOCAINE-EPINEPHRINE (PF) 2 %-1:200000 IJ SOLN
10.0000 mL | Freq: Once | INTRAMUSCULAR | Status: AC
  Administered 2019-04-29: 10 mL
  Filled 2019-04-29: qty 20

## 2019-04-29 MED ORDER — HYDROCODONE-ACETAMINOPHEN 5-325 MG PO TABS
1.0000 | ORAL_TABLET | Freq: Once | ORAL | Status: DC
  Filled 2019-04-29: qty 1

## 2019-04-29 MED ORDER — ACETAMINOPHEN 500 MG PO TABS
1000.0000 mg | ORAL_TABLET | Freq: Once | ORAL | Status: AC
  Administered 2019-04-29: 1000 mg via ORAL
  Filled 2019-04-29: qty 2

## 2019-04-29 NOTE — ED Notes (Signed)
Patient verbalizes understanding of discharge instructions. Opportunity for questioning and answers were provided. Pt discharged from ED. 
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
## 2019-04-29 NOTE — ED Notes (Signed)
Pt transported to xray 

## 2019-04-29 NOTE — ED Triage Notes (Signed)
Pt was at work and states he turned around hitting his right forearm on a wooden pallet and was stuck by a piece of wood, bleeding is controlled appears to be a puncture wound.

## 2019-05-14 ENCOUNTER — Other Ambulatory Visit: Payer: Self-pay | Admitting: Internal Medicine

## 2019-05-15 ENCOUNTER — Telehealth: Payer: Self-pay | Admitting: Neurology

## 2019-05-15 NOTE — Telephone Encounter (Signed)
Pt has called stating that he has had the CPAP for over 30 days, he is now needing the settings to be done on his CPAP.  Pt is asking for a call to discuss

## 2019-05-18 ENCOUNTER — Other Ambulatory Visit: Payer: Self-pay

## 2019-05-18 ENCOUNTER — Ambulatory Visit (INDEPENDENT_AMBULATORY_CARE_PROVIDER_SITE_OTHER): Payer: 59 | Admitting: Family Medicine

## 2019-05-18 ENCOUNTER — Encounter: Payer: Self-pay | Admitting: Family Medicine

## 2019-05-18 VITALS — BP 122/82 | HR 73 | Temp 95.9°F | Ht 71.0 in | Wt 258.2 lb

## 2019-05-18 DIAGNOSIS — I10 Essential (primary) hypertension: Secondary | ICD-10-CM

## 2019-05-18 DIAGNOSIS — E1169 Type 2 diabetes mellitus with other specified complication: Secondary | ICD-10-CM | POA: Diagnosis not present

## 2019-05-18 DIAGNOSIS — IMO0002 Reserved for concepts with insufficient information to code with codable children: Secondary | ICD-10-CM

## 2019-05-18 DIAGNOSIS — E66812 Obesity, class 2: Secondary | ICD-10-CM

## 2019-05-18 DIAGNOSIS — E1129 Type 2 diabetes mellitus with other diabetic kidney complication: Secondary | ICD-10-CM

## 2019-05-18 DIAGNOSIS — R809 Proteinuria, unspecified: Secondary | ICD-10-CM

## 2019-05-18 DIAGNOSIS — Z6837 Body mass index (BMI) 37.0-37.9, adult: Secondary | ICD-10-CM

## 2019-05-18 DIAGNOSIS — E785 Hyperlipidemia, unspecified: Secondary | ICD-10-CM

## 2019-05-18 DIAGNOSIS — E1165 Type 2 diabetes mellitus with hyperglycemia: Secondary | ICD-10-CM

## 2019-05-18 LAB — COMPREHENSIVE METABOLIC PANEL
ALT: 13 U/L (ref 0–53)
AST: 13 U/L (ref 0–37)
Albumin: 4.3 g/dL (ref 3.5–5.2)
Alkaline Phosphatase: 96 U/L (ref 39–117)
BUN: 17 mg/dL (ref 6–23)
CO2: 30 mEq/L (ref 19–32)
Calcium: 9.5 mg/dL (ref 8.4–10.5)
Chloride: 99 mEq/L (ref 96–112)
Creatinine, Ser: 1.18 mg/dL (ref 0.40–1.50)
GFR: 82.38 mL/min (ref >60.00)
Glucose, Bld: 112 mg/dL — ABNORMAL HIGH (ref 70–99)
Potassium: 4 mEq/L (ref 3.5–5.1)
Sodium: 135 mEq/L (ref 135–145)
Total Bilirubin: 0.5 mg/dL (ref 0.2–1.2)
Total Protein: 7.6 g/dL (ref 6.0–8.3)

## 2019-05-18 LAB — MICROALBUMIN / CREATININE URINE RATIO
Creatinine,U: 115.9 mg/dL
Microalb Creat Ratio: 3.8 mg/g (ref 0.0–30.0)
Microalb, Ur: 4.3 mg/dL — ABNORMAL HIGH (ref 0.0–1.9)

## 2019-05-18 LAB — URINALYSIS, ROUTINE W REFLEX MICROSCOPIC
Bilirubin Urine: NEGATIVE
Hgb urine dipstick: NEGATIVE
Ketones, ur: NEGATIVE
Leukocytes,Ua: NEGATIVE
Nitrite: NEGATIVE
RBC / HPF: NONE SEEN (ref 0–?)
Specific Gravity, Urine: 1.025 (ref 1.000–1.030)
Total Protein, Urine: NEGATIVE
Urine Glucose: NEGATIVE
Urobilinogen, UA: 0.2 (ref 0.0–1.0)
pH: 5.5 (ref 5.0–8.0)

## 2019-05-18 LAB — LIPID PANEL
Cholesterol: 131 mg/dL (ref 0–200)
HDL: 28.2 mg/dL — ABNORMAL LOW (ref >39.00)
LDL Cholesterol: 88 mg/dL (ref 0–99)
NonHDL: 102.33
Total CHOL/HDL Ratio: 5
Triglycerides: 74 mg/dL (ref 0.0–149.0)
VLDL: 14.8 mg/dL (ref 0.0–40.0)

## 2019-05-18 LAB — CBC
HCT: 42.3 % (ref 39.0–52.0)
Hemoglobin: 13.7 g/dL (ref 13.0–17.0)
MCHC: 32.4 g/dL (ref 30.0–36.0)
MCV: 64.4 fl — ABNORMAL LOW (ref 78.0–100.0)
Platelets: 248 10*3/uL (ref 150.0–400.0)
RBC: 6.56 Mil/uL — ABNORMAL HIGH (ref 4.22–5.81)
RDW: 16.6 % — ABNORMAL HIGH (ref 11.5–15.5)
WBC: 4 10*3/uL (ref 4.0–10.5)

## 2019-05-18 LAB — LDL CHOLESTEROL, DIRECT: Direct LDL: 88 mg/dL

## 2019-05-18 LAB — HEMOGLOBIN A1C: Hgb A1c MFr Bld: 7.1 % — ABNORMAL HIGH (ref 4.6–6.5)

## 2019-05-18 NOTE — Progress Notes (Signed)
Established Patient Office Visit  Subjective:  Patient ID: Darren Little, male    DOB: Jan 26, 1981  Age: 39 y.o. MRN: [id number]  CC:  Chief Complaint  Patient presents with  . Transitions Of Care    annual exan, toc from Dr. Zigmund Daniel, no concerns.     HPI Darren Little presents for follow-up of his hypertension, diabetes, elevated cholesterol and obesity.  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  He has been taking all of his medicines and losing weight on a low-carb diet.  Feeling much better.  Vision has improved.  He is using his CPAP machine nightly.  Past Medical History:  Diagnosis Date  . Abnormal CT scan 08/2012   R axilla adenopathy  . 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 Abnormal RBC 09/26/12   Elliptocytes  . Hx of cardiovascular stress test    ETT-Myoview 7/14:  Normal, EF 54%, no ischemia  . Hypertension   . LVH (left ventricular hypertrophy)    08/2712 echo-EF 55-60%, moderate LVH and mild LA dilatation.   . Microcytosis    MCV 65  . Mild sleep apnea    a. sleep study 9/14:  mild OSA, AHI 10/hour; O2 desat nadir 88% => recommend weight loss  . Prediabetes    A1C 6.1% 08/2012  . Uncontrolled type 2 diabetes mellitus with microalbuminuria ([place]) 12/31/2018    Past Surgical History:  Procedure Laterality Date  . HAND SURGERY Right 2000   Right hand, 5years ago    Family History  Problem Relation Age of Onset  . Heart disease Mother   . Hypertension Mother   . Hypertension Maternal Grandfather   . Heart attack Neg Hx     Social History   Socioeconomic History  . Marital status: Married    Spouse name: Not on file  . Number of children: 2  . Years of education: 12 grade  . Highest education level: Not on file  Occupational History  . Not on file  Tobacco Use  . Smoking status: Never Smoker  . Smokeless tobacco: Never Used  Substance and Sexual Activity  . Alcohol use:  No  . Drug use: No  . Sexual activity: Not on file  Other Topics Concern  . Not on file  Social History Narrative  . Not on file   Social Determinants of Health   Financial Resource Strain:   . Difficulty of Paying Living Expenses: Not on file  Food Insecurity:   . Worried About Charity fundraiser in the Last Year: Not on file  . Ran Out of Food in the Last Year: Not on file  Transportation Needs:   . Lack of Transportation (Medical): Not on file  . Lack of Transportation (Non-Medical): Not on file  Physical Activity:   . Days of Exercise per Week: Not on file  . Minutes of Exercise per Session: Not on file  Stress:   . Feeling of Stress : Not on file  Social Connections:   . Frequency of Communication with Friends and Family: Not on file  . Frequency of Social Gatherings with Friends and Family: Not on file  . Attends Religious Services: Not on file  . Active Member of Clubs or Organizations: Not on file  . Attends Archivist Meetings: Not on file  . Marital Status: Not on file  Intimate Partner Violence:   . Fear of Current  or Ex-Partner: Not on file  . Emotionally Abused: Not on file  . Physically Abused: Not on file  . Sexually Abused: Not on file    Outpatient Medications Prior to Visit  Medication Sig Dispense Refill  . Accu-Chek FastClix Lancets MISC USE 1 TO CHECK GLUCOSE TWICE DAILY    . ACCU-CHEK GUIDE test strip USE 1 TWICE DAILY    . amLODipine (NORVASC) 10 MG tablet Take 1 tablet (10 mg total) by mouth daily. 90 tablet 1  . blood glucose meter kit and supplies KIT Use to check FSBS BID. (Dx: E11.29, E11.65, R80.9). 1 each 0  . EPINEPHrine (EPIPEN 2-PAK) 0.3 mg/0.3 mL IJ SOAJ injection Inject 0.3 mLs (0.3 mg total) into the muscle as needed for anaphylaxis. 2 each 0  . hydrochlorothiazide (MICROZIDE) 12.5 MG capsule Take 1 capsule (12.5 mg total) by mouth daily. 90 capsule 3  . lisinopril (ZESTRIL) 40 MG tablet Take 1 tablet (40 mg total) by mouth  daily. 90 tablet 3  . lovastatin (MEVACOR) 20 MG tablet Take 1 tablet (20 mg total) by mouth at bedtime. 30 tablet 3  . metFORMIN (GLUCOPHAGE) 500 MG tablet Take 500 mg by mouth 2 (two) times daily.    . Semaglutide,0.25 or 0.5MG/DOS, (OZEMPIC, 0.25 OR 0.5 MG/DOSE,) 2 MG/1.5ML SOPN Inject 0.5 mg into the skin once a week. Inject 0.93m into the skin weekly x4 weeks then increase to 0.567mweekly. 4 pen 3  . Dulaglutide (TRULICITY) 0.6.59GDJ/5.7SVOPN Inject 0.75 mg into the skin once a week. (Patient not taking: Reported on 05/18/2019) 4 pen 5  . metFORMIN (GLUCOPHAGE-XR) 750 MG 24 hr tablet Take 1 tablet (750 mg total) by mouth daily with breakfast. (Patient not taking: Reported on 05/18/2019) 90 tablet 1  . metoprolol tartrate (LOPRESSOR) 100 MG tablet TAKE 2 HOURS PRIOR TO SCAN (Patient not taking: Reported on 05/18/2019) 1 tablet 0   No facility-administered medications prior to visit.    Allergies  Allergen Reactions  . Bee Venom Swelling  . Other Other (See Comments)    Squash- hives    ROS Review of Systems  Constitutional: Negative.   HENT: Negative.   Eyes: Negative for photophobia and visual disturbance.  Respiratory: Negative.   Cardiovascular: Negative.   Gastrointestinal: Negative.   Endocrine: Negative for polyphagia and polyuria.  Genitourinary: Negative.   Musculoskeletal: Negative for gait problem and joint swelling.  Skin: Negative for color change and pallor.  Allergic/Immunologic: Negative for immunocompromised state.  Neurological: Negative for seizures and numbness.  Hematological: Does not bruise/bleed easily.   [facility] 05/18/2019 05/18/2019 01/19/2019  Decreased Interest 0 0 0  Down, Depressed, Hopeless 0 0 0  PHQ - 2 Score 0 0 0  Altered sleeping 0 - -  Tired, decreased energy 0 - -  Change in appetite 0 - -  Feeling bad or failure about yourself  0 - -  Trouble concentrating 0 - -  Moving slowly or fidgety/restless 0 - -  Suicidal  thoughts 0 - -  PHQ-9 Score 0 - -       Objective:    Physical Exam  Constitutional: He is oriented to person, place, and time. He appears well-developed and well-nourished. No distress.  HENT:  Head: Normocephalic and atraumatic.  Right Ear: External ear normal.  Left Ear: External ear normal.  Eyes: Conjunctivae are normal. Right eye exhibits no discharge. Left eye exhibits no discharge. No scleral icterus.  Neck: No JVD present. No tracheal deviation  present. No thyromegaly present.  Cardiovascular: Normal rate, regular rhythm and normal heart sounds.  Pulmonary/Chest: Effort normal and breath sounds normal. No stridor.  Abdominal: Bowel sounds are normal.  Musculoskeletal:        General: No edema.  Lymphadenopathy:    He has no cervical adenopathy.  Neurological: He is alert and oriented to person, place, and time.  Skin: Skin is warm and dry. He is not diaphoretic.  Psychiatric: He has a normal mood and affect. His behavior is normal.   Diabetic Foot Exam - Simple   Simple Foot Form Diabetic Foot exam was performed with the following findings: Yes 05/18/2019 10:56 AM  Visual Inspection See comments: Yes Sensation Testing Intact to touch and monofilament testing bilaterally: Yes Pulse Check Posterior Tibialis and Dorsalis pulse intact bilaterally: Yes Comments  Feet are pes planus bilaterally.  Skin is dry.  Left great toenail is oncotic.  There is some scaling about the feet.    BP 122/82   Pulse 73   Temp (!) 95.9 F (35.5 C) (Tympanic)   Ht 5' 11"  (1.803 m)   Wt 258 lb 3.2 oz (117.1 kg)   SpO2 99%   BMI 36.01 kg/m  Wt Readings from Last 3 Encounters:  05/18/19 258 lb 3.2 oz (117.1 kg)  02/16/19 266 lb (120.7 kg)  02/02/19 270 lb 6.4 oz (122.7 kg)     Health Maintenance Due  Topic Date Due  . FOOT EXAM  07/15/1988  . OPHTHALMOLOGY EXAM  07/15/1988  . HIV Screening  07/15/1993    There are no preventive care reminders to display for this  patient.  Lab Results  Component Value Date   TSH 1.162 09/26/2012   Lab Results  Component Value Date   WBC 4.7 12/30/2018   HGB 12.7 (L) 12/30/2018   HCT 42.0 12/30/2018   MCV 69 (L) 12/30/2018   PLT 264 12/30/2018   Lab Results  Component Value Date   NA 138 01/19/2019   K 3.8 01/19/2019   CO2 26 01/19/2019   GLUCOSE 114 (H) 01/19/2019   BUN 16 01/19/2019   CREATININE 1.06 01/19/2019   BILITOT <0.2 12/30/2018   ALKPHOS 105 12/30/2018   AST 14 12/30/2018   ALT 10 12/30/2018   PROT 7.0 12/30/2018   ALBUMIN 4.1 12/30/2018   CALCIUM 9.1 01/19/2019   ANIONGAP 10 05/25/2015   GFR 93.39 01/19/2019   Lab Results  Component Value Date   CHOL 137 01/19/2019   Lab Results  Component Value Date   HDL 27.90 (L) 01/19/2019   Lab Results  Component Value Date   LDLCALC 98 01/19/2019   Lab Results  Component Value Date   TRIG 55.0 01/19/2019   Lab Results  Component Value Date   CHOLHDL 5 01/19/2019   Lab Results  Component Value Date   HGBA1C 11.0 (H) 12/30/2018      Assessment & Plan:   Problem List Items Addressed This Visit      Cardiovascular and Mediastinum   Essential hypertension - Primary   Relevant Orders   CBC   Comprehensive metabolic panel   Urinalysis, Routine w reflex microscopic   Microalbumin / creatinine urine ratio     Endocrine   Uncontrolled type 2 diabetes mellitus with microalbuminuria (HCC)   Relevant Orders   Comprehensive metabolic panel   Hyperlipidemia associated with type 2 diabetes mellitus ([place])   Relevant Orders   Comprehensive metabolic panel   LDL cholesterol, direct   Lipid panel  Hemoglobin A1c   Microalbumin / creatinine urine ratio     Other   Class 2 severe obesity due to excess calories with serious comorbidity and body mass index (BMI) of 37.0 to 37.9 in adult [facility])      No orders of the defined types were placed in this encounter.   Follow-up: Return in about 3 months (around 08/15/2019), or keep up  the good work.  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  He was given information on general diabetic care.  We discussed skin care with using gentle soaps and moisturizing creams.  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 Libby Maw, MD

## 2019-05-18 NOTE — Patient Instructions (Signed)
Type 2 Diabetes Mellitus, Self Care, Adult When you have type 2 diabetes (type 2 diabetes mellitus), you must make sure your blood sugar (glucose) stays in a healthy range. You can do this with:  Nutrition.  Exercise.  Lifestyle changes.  Medicines or insulin, if needed.  Support from your doctors and others. How to stay aware of blood sugar   Check your blood sugar level every day, as often as told.  Have your A1c (hemoglobin A1c) level checked two or more times a year. Have it checked more often if your doctor tells you to. Your doctor will set personal treatment goals for you. Generally, you should have these blood sugar levels:  Before meals (preprandial): 80-130 mg/dL (4.4-7.2 mmol/L).  After meals (postprandial): below 180 mg/dL (10 mmol/L).  A1c level: less than 7%. How to manage high and low blood sugar Signs of high blood sugar High blood sugar is called hyperglycemia. Know the signs of high blood sugar. Signs may include:  Feeling: ? Thirsty. ? Hungry. ? Very tired.  Needing to pee (urinate) more than usual.  Blurry vision. Signs of low blood sugar Low blood sugar is called hypoglycemia. This is when blood sugar is at or below 70 mg/dL (3.9 mmol/L). Signs may include:  Feeling: ? Hungry. ? Worried or nervous (anxious). ? Sweaty and clammy. ? Confused. ? Dizzy. ? Sleepy. ? Sick to your stomach (nauseous).  Having: ? A fast heartbeat. ? A headache. ? A change in your vision. ? Jerky movements that you cannot control (seizure). ? Tingling or no feeling (numbness) around your mouth, lips, or tongue.  Having trouble with: ? Moving (coordination). ? Sleeping. ? Passing out (fainting). ? Getting upset easily (irritability). Treating low blood sugar To treat low blood sugar, eat or drink something sugary right away. If you can think clearly and swallow safely, follow the 15:15 rule:  Take 15 grams of a fast-acting carb (carbohydrate). Talk with your  doctor about how much you should take.  Some fast-acting carbs are: ? Sugar tablets (glucose pills). Take 3-4 pills. ? 6-8 pieces of hard candy. ? 4-6 oz (120-150 mL) of fruit juice. ? 4-6 oz (120-150 mL) of regular (not diet) soda. ? 1 Tbsp (15 mL) honey or sugar.  Check your blood sugar 15 minutes after you take the carb.  If your blood sugar is still at or below 70 mg/dL (3.9 mmol/L), take 15 grams of a carb again.  If your blood sugar does not go above 70 mg/dL (3.9 mmol/L) after 3 tries, get help right away.  After your blood sugar goes back to normal, eat a meal or a snack within 1 hour. Treating very low blood sugar If your blood sugar is at or below 54 mg/dL (3 mmol/L), you have very low blood sugar (severe hypoglycemia). This is an emergency. Do not wait to see if the symptoms will go away. Get medical help right away. Call your local emergency services (911 in the [place] If you have very low blood sugar and you cannot eat or drink, you may need a glucagon shot (injection). A family member or friend should learn how to check your blood sugar and how to give you a glucagon shot. Ask your doctor if you need to have a glucagon shot kit at home. Follow these instructions at home: Medicine  Take insulin and diabetes medicines as told.  If your doctor says you should take more or less insulin and medicines, do this exactly as told.  Do not run out of insulin or medicines. Having diabetes can raise your risk for other long-term conditions. These include heart disease and kidney disease. Your doctor may prescribe medicines to help you not have these problems. Food   Make healthy food choices. These include: ? Chicken, fish, egg whites, and beans. ? Oats, whole wheat, bulgur, brown rice, quinoa, and millet. ? Fresh fruits and vegetables. ? Low-fat dairy products. ? Nuts, avocado, olive oil, and canola oil.  Meet with a food specialist (dietitian). He or she can help you make an  eating plan that is right for you.  Follow instructions from your doctor about what you cannot eat or drink.  Drink enough fluid to keep your pee (urine) pale yellow.  Keep track of carbs that you eat. Do this by reading food labels and learning food serving sizes.  Follow your sick day plan when you cannot eat or drink normally. Make this plan with your doctor so it is ready to use. Activity  Exercise 3 or more times a week.  Do not go more than 2 days without exercising.  Talk with your doctor before you start a new exercise. Your doctor may need to tell you to change: ? How much insulin or medicines you take. ? How much food you eat. Lifestyle  Do not use any tobacco products. These include cigarettes, chewing tobacco, and e-cigarettes. If you need help quitting, ask your doctor.  Ask your doctor how much alcohol is safe for you.  Learn to deal with stress. If you need help with this, ask your doctor. Body care   Stay up to date with your shots (immunizations).  Have your eyes and feet checked by a doctor as often as told.  Check your skin and feet every day. Check for cuts, bruises, redness, blisters, or sores.  Brush your teeth and gums two times a day. Floss one or more times a day.  Go to the dentist one or more times every 6 months.  Stay at a healthy weight. General instructions  Take over-the-counter and prescription medicines only as told by your doctor.  Share your diabetes care plan with: ? Your work or school. ? People you live with.  Carry a card or wear jewelry that says you have diabetes.  Keep all follow-up visits as told by your doctor. This is important. Questions to ask your doctor  Do I need to meet with a diabetes educator?  Where can I find a support group for people with diabetes? Where to find more information To learn more about diabetes, visit:  American Diabetes Association: www.diabetes.org  American Association of Diabetes  Educators: www.diabeteseducator.org Summary  When you have type 2 diabetes, you must make sure your blood sugar (glucose) stays in a healthy range.  Check your blood sugar every day, as often as told.  Having diabetes can raise your risk for other conditions. Your doctor may prescribe medicines to help you not have these problems.  Keep all follow-up visits as told by your doctor. This is important. This information is not intended to replace advice given to you by your health care provider. Make sure you discuss any questions you have with your health care provider. Document Revised: 09/09/2017 Document Reviewed: 04/22/2015 Elsevier Patient Education  2020 Elsevier Inc.  

## 2019-05-18 NOTE — Telephone Encounter (Signed)
Called the patient back and there was no answer. Left a detailed message advising the patient that he should reach out to [facility]. If I am not mistaken the patient can take the machine there and have them complete a download of the machine that was a auto CPAP. This will allow them to get the download and forward the information to [place]. Dr Dohmeier then can prescribe the setting to have his CPAP machine to based off that download. We then would need to schedule a follow up visit here 31-90 days after the patient has started to use the machine at the current pressure Setting. PT can also bring his machine to [place] and we download the information if he would like he would just need to know he may have depending on if I am rooming a patient when he comes up here. The download itself doesn't take to long tho.  Left a message with this information. Will await call back or for [facility] to send [place] the download.

## 2019-06-02 ENCOUNTER — Other Ambulatory Visit: Payer: Self-pay | Admitting: Internal Medicine

## 2019-08-17 ENCOUNTER — Ambulatory Visit: Payer: 59 | Admitting: Family Medicine

## 2019-09-01 ENCOUNTER — Other Ambulatory Visit: Payer: Self-pay

## 2019-09-01 ENCOUNTER — Encounter: Payer: Self-pay | Admitting: Family Medicine

## 2019-09-01 ENCOUNTER — Ambulatory Visit (INDEPENDENT_AMBULATORY_CARE_PROVIDER_SITE_OTHER): Payer: 59 | Admitting: Family Medicine

## 2019-09-01 VITALS — BP 120/90 | HR 97 | Temp 96.0°F | Ht 71.0 in | Wt 260.8 lb

## 2019-09-01 DIAGNOSIS — I10 Essential (primary) hypertension: Secondary | ICD-10-CM

## 2019-09-01 DIAGNOSIS — E1169 Type 2 diabetes mellitus with other specified complication: Secondary | ICD-10-CM

## 2019-09-01 DIAGNOSIS — E785 Hyperlipidemia, unspecified: Secondary | ICD-10-CM | POA: Diagnosis not present

## 2019-09-01 LAB — LDL CHOLESTEROL, DIRECT: Direct LDL: 84 mg/dL

## 2019-09-01 LAB — BASIC METABOLIC PANEL
BUN: 18 mg/dL (ref 6–23)
CO2: 28 mEq/L (ref 19–32)
Calcium: 9.4 mg/dL (ref 8.4–10.5)
Chloride: 99 mEq/L (ref 96–112)
Creatinine, Ser: 1.16 mg/dL (ref 0.40–1.50)
GFR: 83.9 mL/min (ref >60.00)
Glucose, Bld: 73 mg/dL (ref 70–99)
Potassium: 4.1 mEq/L (ref 3.5–5.1)
Sodium: 135 mEq/L (ref 135–145)

## 2019-09-01 LAB — HEMOGLOBIN A1C: Hgb A1c MFr Bld: 6.6 % — ABNORMAL HIGH (ref 4.6–6.5)

## 2019-09-01 MED ORDER — METFORMIN HCL 500 MG PO TABS: 500.0000 mg | ORAL_TABLET | Freq: Two times a day (BID) | ORAL | 1 refills | Status: DC

## 2019-09-01 MED ORDER — LOVASTATIN 20 MG PO TABS: 20.0000 mg | ORAL_TABLET | Freq: Every day | ORAL | 3 refills | Status: DC

## 2019-09-01 MED ORDER — OZEMPIC (0.25 OR 0.5 MG/DOSE) 2 MG/1.5ML ~~LOC~~ SOPN: 0.5000 mg | PEN_INJECTOR | SUBCUTANEOUS | 3 refills | Status: DC

## 2019-09-01 MED ORDER — AMLODIPINE BESYLATE 10 MG PO TABS: 10.0000 mg | ORAL_TABLET | Freq: Every day | ORAL | 1 refills | Status: DC

## 2019-09-01 MED ORDER — LISINOPRIL 40 MG PO TABS: 40.0000 mg | ORAL_TABLET | Freq: Every day | ORAL | 3 refills | Status: DC

## 2019-09-01 MED ORDER — HYDROCHLOROTHIAZIDE 25 MG PO TABS: 25.0000 mg | ORAL_TABLET | Freq: Every day | ORAL | 3 refills | Status: DC

## 2019-09-01 NOTE — Progress Notes (Signed)
Established Patient Office Visit  Subjective:  Patient ID: Darren Little, male    DOB: 01-27-1981  Age: 39 y.o. MRN: [id number]  CC:  Chief Complaint  Patient presents with  . Follow-up    3 month follow for DM.  Pt c/o feeling like something is burning in his chest after he takes the metformin.  Pt also c/o not feeling good after her takes the Ozempic.    HPI Darren Little presents for follow-up of his hypertension, diabetes, elevated lipids.  Continues to work out any lose weight.  Is having a strange feeling sometimes after he injects the semaglutide but it is tolerable.  Sometimes feels nausea with the Metformin but again it is tolerated but tolerable.  Blood pressure has been well controlled.  Continues to take Mevacor without issue for his cholesterol.  Eye exam scheduled for July.  Has a dental check this coming week.  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  Past Medical History:  Diagnosis Date  . Abnormal CT scan 08/2012   R axilla adenopathy  . 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 Abnormal RBC 09/26/12   Elliptocytes  . Hx of cardiovascular stress test    ETT-Myoview 7/14:  Normal, EF 54%, no ischemia  . Hypertension   . LVH (left ventricular hypertrophy)    08/2712 echo-EF 55-60%, moderate LVH and mild LA dilatation.   . Microcytosis    MCV 65  . Mild sleep apnea    a. sleep study 9/14:  mild OSA, AHI 10/hour; O2 desat nadir 88% => recommend weight loss  . Prediabetes    A1C 6.1% 08/2012  . Uncontrolled type 2 diabetes mellitus with microalbuminuria ([place]) 12/31/2018    Past Surgical History:  Procedure Laterality Date  . HAND SURGERY Right 2000   Right hand, 5years ago    Family History  Problem Relation Age of Onset  . Heart disease Mother   . Hypertension Mother   . Hypertension Maternal Grandfather   . Heart attack Neg Hx     Social History   Socioeconomic History  . Marital status: Married    Spouse name: Not on file  .  Number of children: 2  . Years of education: 12 grade  . Highest education level: Not on file  Occupational History  . Not on file  Tobacco Use  . Smoking status: Never Smoker  . Smokeless tobacco: Never Used  Substance and Sexual Activity  . Alcohol use: No  . Drug use: No  . Sexual activity: Not on file  Other Topics Concern  . Not on file  Social History Narrative  . Not on file   Social Determinants of Health   Financial Resource Strain:   . Difficulty of Paying Living Expenses:   Food Insecurity:   . Worried About Charity fundraiser in the Last Year:   . Arboriculturist in the Last Year:   Transportation Needs:   . Film/video editor (Medical):   Marland Kitchen Lack of Transportation (Non-Medical):   Physical Activity:   . Days of Exercise per Week:   . Minutes of Exercise per Session:   Stress:   . Feeling of Stress :   Social Connections:   . Frequency of Communication with Friends and Family:   . Frequency of Social Gatherings with Friends and Family:   . Attends Religious Services:   . Active Member of Clubs or Organizations:   . Attends Club or  Organization Meetings:   Marland Kitchen Marital Status:   Intimate Partner Violence:   . Fear of Current or Ex-Partner:   . Emotionally Abused:   Marland Kitchen Physically Abused:   . Sexually Abused:     Outpatient Medications Prior to Visit  Medication Sig Dispense Refill  . Accu-Chek FastClix Lancets MISC USE 1 TO CHECK GLUCOSE TWICE DAILY    . ACCU-CHEK GUIDE test strip USE 1 TWICE DAILY    . blood glucose meter kit and supplies KIT Use to check FSBS BID. (Dx: E11.29, E11.65, R80.9). 1 each 0  . EPINEPHrine (EPIPEN 2-PAK) 0.3 mg/0.3 mL IJ SOAJ injection Inject 0.3 mLs (0.3 mg total) into the muscle as needed for anaphylaxis. 2 each 0  . amLODipine (NORVASC) 10 MG tablet Take 1 tablet (10 mg total) by mouth daily. 90 tablet 1  . hydrochlorothiazide (MICROZIDE) 12.5 MG capsule Take 1 capsule (12.5 mg total) by mouth daily. 90 capsule 3  .  lisinopril (ZESTRIL) 40 MG tablet Take 1 tablet (40 mg total) by mouth daily. 90 tablet 3  . lovastatin (MEVACOR) 20 MG tablet Take 1 tablet (20 mg total) by mouth at bedtime. 30 tablet 3  . metFORMIN (GLUCOPHAGE) 500 MG tablet Take 500 mg by mouth 2 (two) times daily.    . Semaglutide,0.25 or 0.5MG/DOS, (OZEMPIC, 0.25 OR 0.5 MG/DOSE,) 2 MG/1.5ML SOPN Inject 0.5 mg into the skin once a week. Inject 0.75m into the skin weekly x4 weeks then increase to 0.587mweekly. 4 pen 3   No facility-administered medications prior to visit.    Allergies  Allergen Reactions  . Bee Venom Swelling  . Other Other (See Comments)    Squash- hives    ROS Review of Systems  Constitutional: Negative.   HENT: Negative.   Eyes: Negative for photophobia and visual disturbance.  Respiratory: Negative.   Cardiovascular: Negative.   Gastrointestinal: Negative.   Endocrine: Negative for polyphagia and polyuria.  Genitourinary: Negative.   Musculoskeletal: Negative for gait problem and joint swelling.  Skin: Negative for pallor and rash.  Allergic/Immunologic: Negative for immunocompromised state.  Neurological: Negative for light-headedness and headaches.  Hematological: Does not bruise/bleed easily.  Psychiatric/Behavioral: Negative.       Objective:    Physical Exam  Constitutional: He is oriented to person, place, and time. He appears well-developed and well-nourished. No distress.  HENT:  Head: Normocephalic and atraumatic.  Right Ear: External ear normal.  Left Ear: External ear normal.  Mouth/Throat: Oropharynx is clear and moist.  Eyes: Conjunctivae are normal. Right eye exhibits no discharge. Left eye exhibits no discharge. No scleral icterus.  Neck: No JVD present. No tracheal deviation present. No thyromegaly present.  Cardiovascular: Normal rate, regular rhythm and normal heart sounds.  Pulmonary/Chest: Effort normal and breath sounds normal. No stridor.  Musculoskeletal:        General:  No edema.  Lymphadenopathy:    He has no cervical adenopathy.  Neurological: He is alert and oriented to person, place, and time.  Skin: Skin is warm and dry. He is not diaphoretic.  Psychiatric: He has a normal mood and affect. His behavior is normal.    BP 120/90 (BP Location: Left Arm, Patient Position: Sitting, Cuff Size: Normal)   Pulse 97   Temp (!) 96 F (35.6 C) (Temporal)   Ht _0  (1.803 m)   Wt 260 lb 12.8 oz (118.3 kg)   SpO2 98%   BMI 36.37 kg/m  Wt Readings from Last 3 Encounters:  09/01/19 260  lb 12.8 oz (118.3 kg)  05/18/19 258 lb 3.2 oz (117.1 kg)  02/16/19 266 lb (120.7 kg)     Health Maintenance Due  Topic Date Due  . OPHTHALMOLOGY EXAM  Never done  . COVID-19 Vaccine (1) Never done  . HIV Screening  Never done    There are no preventive care reminders to display for this patient.  Lab Results  Component Value Date   TSH 1.162 09/26/2012   Lab Results  Component Value Date   WBC 4.0 05/18/2019   HGB 13.7 05/18/2019   HCT 42.3 05/18/2019   MCV 64.4 Repeated and verified X2. (L) 05/18/2019   PLT 248.0 05/18/2019   Lab Results  Component Value Date   NA 135 05/18/2019   K 4.0 05/18/2019   CO2 30 05/18/2019   GLUCOSE 112 (H) 05/18/2019   BUN 17 05/18/2019   CREATININE 1.18 05/18/2019   BILITOT 0.5 05/18/2019   ALKPHOS 96 05/18/2019   AST 13 05/18/2019   ALT 13 05/18/2019   PROT 7.6 05/18/2019   ALBUMIN 4.3 05/18/2019   CALCIUM 9.5 05/18/2019   ANIONGAP 10 05/25/2015   GFR 82.38 05/18/2019   Lab Results  Component Value Date   CHOL 131 05/18/2019   Lab Results  Component Value Date   HDL 28.20 (L) 05/18/2019   Lab Results  Component Value Date   LDLCALC 88 05/18/2019   Lab Results  Component Value Date   TRIG 74.0 05/18/2019   Lab Results  Component Value Date   CHOLHDL 5 05/18/2019   Lab Results  Component Value Date   HGBA1C 7.1 (H) 05/18/2019      Assessment & Plan:   Problem List Items Addressed This Visit       Cardiovascular and Mediastinum   Essential hypertension - Primary   Relevant Medications   amLODipine (NORVASC) 10 MG tablet   lisinopril (ZESTRIL) 40 MG tablet   lovastatin (MEVACOR) 20 MG tablet   hydrochlorothiazide (HYDRODIURIL) 25 MG tablet   Other Relevant Orders   Basic metabolic panel     Endocrine   Hyperlipidemia associated with type 2 diabetes mellitus (HCC)   Relevant Medications   lisinopril (ZESTRIL) 40 MG tablet   lovastatin (MEVACOR) 20 MG tablet   metFORMIN (GLUCOPHAGE) 500 MG tablet   Semaglutide,0.25 or 0.'5MG'$ /DOS, (OZEMPIC, 0.25 OR 0.5 MG/DOSE,) 2 MG/1.5ML SOPN   Other Relevant Orders   Basic metabolic panel   Hemoglobin A1c   LDL cholesterol, direct      Meds ordered this encounter  Medications  . amLODipine (NORVASC) 10 MG tablet    Sig: Take 1 tablet (10 mg total) by mouth daily.    Dispense:  90 tablet    Refill:  1  . lisinopril (ZESTRIL) 40 MG tablet    Sig: Take 1 tablet (40 mg total) by mouth daily.    Dispense:  90 tablet    Refill:  3  . lovastatin (MEVACOR) 20 MG tablet    Sig: Take 1 tablet (20 mg total) by mouth at bedtime.    Dispense:  30 tablet    Refill:  3  . metFORMIN (GLUCOPHAGE) 500 MG tablet    Sig: Take 1 tablet (500 mg total) by mouth 2 (two) times daily with a meal.    Dispense:  180 tablet    Refill:  1  . Semaglutide,0.25 or 0.'5MG'$ /DOS, (OZEMPIC, 0.25 OR 0.5 MG/DOSE,) 2 MG/1.5ML SOPN    Sig: Inject 0.5 mg into the skin once a week.  Inject 0.75m into the skin weekly x4 weeks then increase to 0.535mweekly.    Dispense:  4 pen    Refill:  3  . hydrochlorothiazide (HYDRODIURIL) 25 MG tablet    Sig: Take 1 tablet (25 mg total) by mouth daily.    Dispense:  90 tablet    Refill:  3    Follow-up: Return in about 6 months (around 03/02/2020), or keep up the good work! good to see you!. Libby MawMD

## 2019-09-11 ENCOUNTER — Telehealth: Payer: Self-pay | Admitting: Family Medicine

## 2019-09-11 NOTE — Telephone Encounter (Signed)
Patient is calling and wanted to speak to someone regarding his blood sugars. CB is [phone]

## 2019-09-15 NOTE — Telephone Encounter (Signed)
Patient calling states that his blood sugar levels have been running lower than normal per patient around 76-80 and 130 after dinner.Patient states that he just recently started on a OTC vitamin from [facility] he's not sure if this is the reason for the low blood sugars but he will stop taking this for a while and see if the numbers change. Patient will give [place] a call back and let us know what his sugars been running. Agree with patient will be waiting on phone call.

## 2019-09-17 NOTE — Telephone Encounter (Signed)
Yes, let us know what some fasting sugars have been and 2 hour post prandial.

## 2019-11-30 ENCOUNTER — Ambulatory Visit (INDEPENDENT_AMBULATORY_CARE_PROVIDER_SITE_OTHER): Payer: 59 | Admitting: Podiatry

## 2019-11-30 ENCOUNTER — Other Ambulatory Visit: Payer: Self-pay

## 2019-11-30 DIAGNOSIS — B351 Tinea unguium: Secondary | ICD-10-CM | POA: Diagnosis not present

## 2019-11-30 DIAGNOSIS — Z79899 Other long term (current) drug therapy: Secondary | ICD-10-CM | POA: Diagnosis not present

## 2019-11-30 DIAGNOSIS — B353 Tinea pedis: Secondary | ICD-10-CM

## 2019-11-30 NOTE — Patient Instructions (Addendum)
Terbinafine oral granules What is this medicine? TERBINAFINE (TER bin a feen) is an antifungal medicine. It is used to treat certain kinds of fungal or yeast infections. This medicine may be used for other purposes; ask your health care provider or pharmacist if you have questions. COMMON BRAND NAME(S): Lamisil What should I tell my health care provider before I take this medicine? They need to know if you have any of these conditions:  drink alcoholic beverages  kidney disease  liver disease  an unusual or allergic reaction to Terbinafine, other medicines, foods, dyes, or preservatives  pregnant or trying to get pregnant  breast-feeding How should I use this medicine? Take this medicine by mouth. Follow the directions on the prescription label. Hold packet with cut line on top. Shake packet gently to settle contents. Tear packet open along cut line, or use scissors to cut across line. Carefully pour the entire contents of packet onto a spoonful of a soft food, such as pudding or other soft, non-acidic food such as mashed potatoes (do NOT use applesauce or a fruit-based food). If two packets are required for each dose, you may either sprinkle the content of both packets on one spoonful of non-acidic food, or sprinkle the contents of both packets on two spoonfuls of non-acidic food. Make sure that no granules remain in the packet. Swallow the mxiture of the food and granules without chewing. Take your medicine at regular intervals. Do not take it more often than directed. Take all of your medicine as directed even if you think you are better. Do not skip doses or stop your medicine early. Contact your pediatrician or health care professional regarding the use of this medicine in children. While this medicine may be prescribed for children as young as 4 years for selected conditions, precautions do apply. Overdosage: If you think you have taken too much of this medicine contact a poison control  center or emergency room at once. NOTE: This medicine is only for you. Do not share this medicine with others. What if I miss a dose? If you miss a dose, take it as soon as you can. If it is almost time for your next dose, take only that dose. Do not take double or extra doses. What may interact with this medicine? Do not take this medicine with any of the following medications:  thioridazine This medicine may also interact with the following medications:  beta-blockers  caffeine  cimetidine  cyclosporine  MAOIs like Carbex, Eldepryl, Marplan, Nardil, and Parnate  medicines for fungal infections like fluconazole and ketoconazole  medicines for irregular heartbeat like amiodarone, flecainide and propafenone  rifampin  SSRIs like citalopram, escitalopram, fluoxetine, fluvoxamine, paroxetine and sertraline  tricyclic antidepressants like amitriptyline, clomipramine, desipramine, imipramine, nortriptyline, and others  warfarin This list may not describe all possible interactions. Give your health care provider a list of all the medicines, herbs, non-prescription drugs, or dietary supplements you use. Also tell them if you smoke, drink alcohol, or use illegal drugs. Some items may interact with your medicine. What should I watch for while using this medicine? Your doctor may monitor your liver function. Tell your doctor right away if you have nausea or vomiting, loss of appetite, stomach pain on your right upper side, yellow skin, dark urine, light stools, or are over tired. This medicine may cause serious skin reactions. They can happen weeks to months after starting the medicine. Contact your health care provider right away if you notice fevers or flu-like symptoms   with a rash. The rash may be red or purple and then turn into blisters or peeling of the skin. Or, you might notice a red rash with swelling of the face, lips or lymph nodes in your neck or under your arms. You need to take  this medicine for 6 weeks or longer to cure the fungal infection. Take your medicine regularly for as long as your doctor or health care provider tells you to. What side effects may I notice from receiving this medicine? Side effects that you should report to your doctor or health care professional as soon as possible:  allergic reactions like skin rash or hives, swelling of the face, lips, or tongue  change in vision  dark urine  fever or infection  general ill feeling or flu-like symptoms  light-colored stools  loss of appetite, nausea  rash, fever, and swollen lymph nodes  redness, blistering, peeling or loosening of the skin, including inside the mouth  right upper belly pain  unusually weak or tired  yellowing of the eyes or skin Side effects that usually do not require medical attention (report to your doctor or health care professional if they continue or are bothersome):  changes in taste  diarrhea  hair loss  muscle or joint pain  stomach upset This list may not describe all possible side effects. Call your doctor for medical advice about side effects. You may report side effects to FDA at 1-800-FDA-1088. Where should I keep my medicine? Keep out of the reach of children. Store at room temperature between 15 and 30 degrees C (59 and 86 degrees F). Throw away any unused medicine after the expiration date. NOTE: This sheet is a summary. It may not cover all possible information. If you have questions about this medicine, talk to your doctor, pharmacist, or health care provider.  2020 Elsevier/Gold Standard (2018-06-27 15:35:11)   Diabetes Mellitus and Foot Care Foot care is an important part of your health, especially when you have diabetes. Diabetes may cause you to have problems because of poor blood flow (circulation) to your feet and legs, which can cause your skin to:  Become thinner and drier.  Break more easily.  Heal more slowly.  Peel and  crack. You may also have nerve damage (neuropathy) in your legs and feet, causing decreased feeling in them. This means that you may not notice minor injuries to your feet that could lead to more serious problems. Noticing and addressing any potential problems early is the best way to prevent future foot problems. How to care for your feet Foot hygiene  Wash your feet daily with warm water and mild soap. Do not use hot water. Then, pat your feet and the areas between your toes until they are completely dry. Do not soak your feet as this can dry your skin.  Trim your toenails straight across. Do not dig under them or around the cuticle. File the edges of your nails with an emery board or nail file.  Apply a moisturizing lotion or petroleum jelly to the skin on your feet and to dry, brittle toenails. Use lotion that does not contain alcohol and is unscented. Do not apply lotion between your toes. Shoes and socks  Wear clean socks or stockings every day. Make sure they are not too tight. Do not wear knee-high stockings since they may decrease blood flow to your legs.  Wear shoes that fit properly and have enough cushioning. Always look in your shoes before you put  them on to be sure there are no objects inside.  To break in new shoes, wear them for just a few hours a day. This prevents injuries on your feet. Wounds, scrapes, corns, and calluses  Check your feet daily for blisters, cuts, bruises, sores, and redness. If you cannot see the bottom of your feet, use a mirror or ask someone for help.  Do not cut corns or calluses or try to remove them with medicine.  If you find a minor scrape, cut, or break in the skin on your feet, keep it and the skin around it clean and dry. You may clean these areas with mild soap and water. Do not clean the area with peroxide, alcohol, or iodine.  If you have a wound, scrape, corn, or callus on your foot, look at it several times a day to make sure it is  healing and not infected. Check for: ? Redness, swelling, or pain. ? Fluid or blood. ? Warmth. ? Pus or a bad smell. General instructions  Do not cross your legs. This may decrease blood flow to your feet.  Do not use heating pads or hot water bottles on your feet. They may burn your skin. If you have lost feeling in your feet or legs, you may not know this is happening until it is too late.  Protect your feet from hot and cold by wearing shoes, such as at the beach or on hot pavement.  Schedule a complete foot exam at least once a year (annually) or more often if you have foot problems. If you have foot problems, report any cuts, sores, or bruises to your health care provider immediately. Contact a health care provider if:  You have a medical condition that increases your risk of infection and you have any cuts, sores, or bruises on your feet.  You have an injury that is not healing.  You have redness on your legs or feet.  You feel burning or tingling in your legs or feet.  You have pain or cramps in your legs and feet.  Your legs or feet are numb.  Your feet always feel cold.  You have pain around a toenail. Get help right away if:  You have a wound, scrape, corn, or callus on your foot and: ? You have pain, swelling, or redness that gets worse. ? You have fluid or blood coming from the wound, scrape, corn, or callus. ? Your wound, scrape, corn, or callus feels warm to the touch. ? You have pus or a bad smell coming from the wound, scrape, corn, or callus. ? You have a fever. ? You have a red line going up your leg. Summary  Check your feet every day for cuts, sores, red spots, swelling, and blisters.  Moisturize feet and legs daily.  Wear shoes that fit properly and have enough cushioning.  If you have foot problems, report any cuts, sores, or bruises to your health care provider immediately.  Schedule a complete foot exam at least once a year (annually) or more  often if you have foot problems. This information is not intended to replace advice given to you by your health care provider. Make sure you discuss any questions you have with your health care provider. Document Revised: 12/10/2018 Document Reviewed: 04/20/2016 Elsevier Patient Education  [place].

## 2019-12-01 ENCOUNTER — Other Ambulatory Visit: Payer: Self-pay | Admitting: Podiatry

## 2019-12-01 LAB — HEPATIC FUNCTION PANEL
AG Ratio: 1.4 (calc) (ref 1.0–2.5)
ALT: 11 U/L (ref 9–46)
AST: 21 U/L (ref 10–40)
Albumin: 4.2 g/dL (ref 3.6–5.1)
Alkaline phosphatase (APISO): 72 U/L (ref 36–130)
Bilirubin, Direct: 0.1 mg/dL (ref 0.0–0.2)
Globulin: 3 g/dL (calc) (ref 1.9–3.7)
Indirect Bilirubin: 0.3 mg/dL (calc) (ref 0.2–1.2)
Total Bilirubin: 0.4 mg/dL (ref 0.2–1.2)
Total Protein: 7.2 g/dL (ref 6.1–8.1)

## 2019-12-01 LAB — CBC WITH DIFFERENTIAL/PLATELET
Absolute Monocytes: 710 cells/uL (ref 200–950)
Basophils Absolute: 42 cells/uL (ref 0–200)
Basophils Relative: 0.8 %
Eosinophils Absolute: 201 cells/uL (ref 15–500)
Eosinophils Relative: 3.8 %
HCT: 41.4 % (ref 38.5–50.0)
Hemoglobin: 12.3 g/dL — ABNORMAL LOW (ref 13.2–17.1)
Lymphs Abs: 2094 cells/uL (ref 850–3900)
MCH: 20.3 pg — ABNORMAL LOW (ref 27.0–33.0)
MCHC: 29.7 g/dL — ABNORMAL LOW (ref 32.0–36.0)
MCV: 68.4 fL — ABNORMAL LOW (ref 80.0–100.0)
MPV: 9.8 fL (ref 7.5–12.5)
Monocytes Relative: 13.4 %
Neutro Abs: 2253 cells/uL (ref 1500–7800)
Neutrophils Relative %: 42.5 %
Platelets: 235 10*3/uL (ref 140–400)
RBC: 6.05 10*6/uL — ABNORMAL HIGH (ref 4.20–5.80)
RDW: 17.8 % — ABNORMAL HIGH (ref 11.0–15.0)
Total Lymphocyte: 39.5 %
WBC: 5.3 10*3/uL (ref 3.8–10.8)

## 2019-12-01 LAB — CBC MORPHOLOGY

## 2019-12-01 MED ORDER — TERBINAFINE HCL 250 MG PO TABS: 250.0000 mg | ORAL_TABLET | Freq: Every day | ORAL | 0 refills | Status: DC

## 2019-12-03 NOTE — Progress Notes (Signed)
Subjective:   Patient ID: Darren Little, male   DOB: 39 y.o.   MRN: [id number]   HPI 39 year old male presents the office today for concerns of athlete's foot, peeling skin, thick, discolored toenails.  Tried over-the-counter lotions and creams with insignificant improvement.  He states that his feet do itch.  He will get a white colorimetry in his toes at times as well.  His feet do sweat and he tried changing shoes and socks.  He had no other treatment.  Is no other concerns.   Review of Systems  All other systems reviewed and are negative.  Past Medical History:  Diagnosis Date  . Abnormal CT scan 08/2012   R axilla adenopathy  . 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 Abnormal RBC 09/26/12   Elliptocytes  . Hx of cardiovascular stress test    ETT-Myoview 7/14:  Normal, EF 54%, no ischemia  . Hypertension   . LVH (left ventricular hypertrophy)    08/2712 echo-EF 55-60%, moderate LVH and mild LA dilatation.   . Microcytosis    MCV 65  . Mild sleep apnea    a. sleep study 9/14:  mild OSA, AHI 10/hour; O2 desat nadir 88% => recommend weight loss  . Prediabetes    A1C 6.1% 08/2012  . Uncontrolled type 2 diabetes mellitus with microalbuminuria ([facility]) 12/31/2018    Past Surgical History:  Procedure Laterality Date  . HAND SURGERY Right 2000   Right hand, 5years ago     Current Outpatient Medications:  .  Accu-Chek FastClix Lancets MISC, USE 1 TO CHECK GLUCOSE TWICE DAILY, Disp: , Rfl:  .  ACCU-CHEK GUIDE test strip, USE 1 TWICE DAILY, Disp: , Rfl:  .  amLODipine (NORVASC) 10 MG tablet, Take 1 tablet (10 mg total) by mouth daily., Disp: 90 tablet, Rfl: 1 .  blood glucose meter kit and supplies KIT, Use to check FSBS BID. (Dx: E11.29, E11.65, R80.9)., Disp: 1 each, Rfl: 0 .  EPINEPHrine (EPIPEN 2-PAK) 0.3 mg/0.3 mL IJ SOAJ injection, Inject 0.3 mLs (0.3 mg total) into the muscle as needed for anaphylaxis., Disp: 2 each, Rfl: 0 .  hydrochlorothiazide (HYDRODIURIL)  25 MG tablet, Take 1 tablet (25 mg total) by mouth daily., Disp: 90 tablet, Rfl: 3 .  lisinopril (ZESTRIL) 40 MG tablet, Take 1 tablet (40 mg total) by mouth daily., Disp: 90 tablet, Rfl: 3 .  lovastatin (MEVACOR) 20 MG tablet, Take 1 tablet (20 mg total) by mouth at bedtime., Disp: 30 tablet, Rfl: 3 .  metFORMIN (GLUCOPHAGE) 500 MG tablet, Take 1 tablet (500 mg total) by mouth 2 (two) times daily with a meal., Disp: 180 tablet, Rfl: 1 .  Semaglutide,0.25 or 0.5MG/DOS, (OZEMPIC, 0.25 OR 0.5 MG/DOSE,) 2 MG/1.5ML SOPN, Inject 0.5 mg into the skin once a week. Inject 0.49m into the skin weekly x4 weeks then increase to 0.573mweekly., Disp: 4 pen, Rfl: 3 .  terbinafine (LAMISIL) 250 MG tablet, Take 1 tablet (250 mg total) by mouth daily., Disp: 90 tablet, Rfl: 0  Allergies  Allergen Reactions  . Bee Venom Swelling  . Other Other (See Comments)    Squash- hives        Objective:  Physical Exam  General: AAO x3, NAD  Dermatological: Nails appear to be hypertrophic, dystrophic with yellow, brown discoloration.  There is no hyperpigmentation around the nail.  Dry, peeling, erythematous skin interdigitally and there is macerated tissue on the fourth interspaces bilaterally.  Dry, peeling skin present the  plantar aspect the foot without any skin breakdown, pustules or any drainage or pus.  Vascular: Dorsalis Pedis artery and Posterior Tibial artery pedal pulses are 2/4 bilateral with immedate capillary fill time. There is no pain with calf compression, swelling, warmth, erythema.   Neruologic: Grossly intact via light touch bilateral.  Musculoskeletal: No gross boney pedal deformities bilateral. No pain, crepitus, or limitation noted with foot and ankle range of motion bilateral. Muscular strength 5/5 in all groups tested bilateral.  Gait: Unassisted, Nonantalgic.       Assessment:   Onychomycosis, tinea pedis     Plan:  -Treatment options discussed including all alternatives, risks, and  complications -Etiology of symptoms were discussed -Discussed various treatment options for fungus.  Elects proceed with oral Lamisil discussed side effects of the medication.  We will check a CBC and LFT prior to starting the medication.  Also discussed with him shoe, sock modifications.  He can use antiperspirant spray to help with sweat.  Also discussed warm water with a small amount of vinegar soaks.  Trula Slade DPM

## 2019-12-24 ENCOUNTER — Telehealth: Payer: Self-pay | Admitting: Podiatry

## 2019-12-24 NOTE — Telephone Encounter (Signed)
I called the patient to see how he was doing. When he first started the Lamisil he had a headache but then noticed that he felt like his heart felt "funny" and was having pain to the left side of the body. He also noticed that both legs swelled. He stopped the medication about 2 days ago. Symptoms seem to be better but still having a headache. I strongly encouraged him to go immediately to the ER. He was currently driving a truck. He is going to go to the ER.   Barbaraann Rondo- can you please follow up with him later to make sure he went.

## 2019-12-24 NOTE — Telephone Encounter (Signed)
Patient was prescribed Lamisil and now he is having numbness on the left side of his body. Both legs are swelling now. He is also having problems with his heart. Patient did stop taking the medication 2 days ago but he is still having really bad headaches.

## 2020-01-21 ENCOUNTER — Ambulatory Visit: Payer: 59 | Admitting: Podiatry

## 2020-01-25 ENCOUNTER — Encounter: Payer: Self-pay | Admitting: Family Medicine

## 2020-01-25 ENCOUNTER — Other Ambulatory Visit: Payer: Self-pay

## 2020-01-25 ENCOUNTER — Ambulatory Visit (INDEPENDENT_AMBULATORY_CARE_PROVIDER_SITE_OTHER): Payer: 59 | Admitting: Family Medicine

## 2020-01-25 VITALS — BP 138/82 | HR 71 | Temp 97.8°F | Wt 273.2 lb

## 2020-01-25 DIAGNOSIS — I10 Essential (primary) hypertension: Secondary | ICD-10-CM

## 2020-01-25 DIAGNOSIS — E1165 Type 2 diabetes mellitus with hyperglycemia: Secondary | ICD-10-CM | POA: Insufficient documentation

## 2020-01-25 DIAGNOSIS — M222X1 Patellofemoral disorders, right knee: Secondary | ICD-10-CM

## 2020-01-25 DIAGNOSIS — M222X2 Patellofemoral disorders, left knee: Secondary | ICD-10-CM | POA: Insufficient documentation

## 2020-01-25 DIAGNOSIS — E1169 Type 2 diabetes mellitus with other specified complication: Secondary | ICD-10-CM | POA: Diagnosis not present

## 2020-01-25 DIAGNOSIS — Z91038 Other insect allergy status: Secondary | ICD-10-CM | POA: Insufficient documentation

## 2020-01-25 DIAGNOSIS — M25562 Pain in left knee: Secondary | ICD-10-CM

## 2020-01-25 DIAGNOSIS — M25561 Pain in right knee: Secondary | ICD-10-CM

## 2020-01-25 DIAGNOSIS — E119 Type 2 diabetes mellitus without complications: Secondary | ICD-10-CM

## 2020-01-25 DIAGNOSIS — D649 Anemia, unspecified: Secondary | ICD-10-CM

## 2020-01-25 DIAGNOSIS — M65341 Trigger finger, right ring finger: Secondary | ICD-10-CM

## 2020-01-25 DIAGNOSIS — E785 Hyperlipidemia, unspecified: Secondary | ICD-10-CM | POA: Diagnosis not present

## 2020-01-25 HISTORY — DX: Other insect allergy status: Z91.038

## 2020-01-25 HISTORY — DX: Trigger finger, right ring finger: M65.341

## 2020-01-25 HISTORY — DX: Patellofemoral disorders, right knee: M22.2X1

## 2020-01-25 HISTORY — DX: Type 2 diabetes mellitus without complications: E11.9

## 2020-01-25 HISTORY — DX: Anemia, unspecified: D64.9

## 2020-01-25 MED ORDER — AMLODIPINE BESYLATE 10 MG PO TABS: 10.0000 mg | ORAL_TABLET | Freq: Every day | ORAL | 1 refills | Status: DC

## 2020-01-25 MED ORDER — EPINEPHRINE 0.3 MG/0.3ML IJ SOAJ: 0.3000 mg | INTRAMUSCULAR | 0 refills | Status: DC | PRN

## 2020-01-25 MED ORDER — LISINOPRIL 40 MG PO TABS: 40.0000 mg | ORAL_TABLET | Freq: Every day | ORAL | 3 refills | Status: DC

## 2020-01-25 MED ORDER — LOVASTATIN 20 MG PO TABS: 20.0000 mg | ORAL_TABLET | Freq: Every day | ORAL | 3 refills | Status: DC

## 2020-01-25 MED ORDER — METFORMIN HCL 500 MG PO TABS: 500.0000 mg | ORAL_TABLET | Freq: Two times a day (BID) | ORAL | 1 refills | Status: DC

## 2020-01-25 NOTE — Progress Notes (Signed)
Established Patient Office Visit  Subjective:  Patient ID: Darren Little, male    DOB: 01/10/1981  Age: 39 y.o. MRN: [id number]  CC:  Chief Complaint  Patient presents with  . Follow-up    follow up on medication. Patient states that sugar levels are running low, would like knot on right hand checked, legs swollen sometimes.     HPI Darren Little presents for follow-up of hypertension, diabetes, elevated cholesterol, hand and leg Pain and anemia.  Patient is seen no blood in his urine or stool.  Blood pressures been controlled with amlodipine and lisinopril.  He does experience some swelling in his lower extremities that reduces at night.  Diabetes has been well controlled but he has gained 14 pounds since her last visit.  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  He has been having pain in his right fourth finger.  History of traumatic injury to the flexor tendon of this finger.  There is pain when the finger is flexed.  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  Is also been having pain in his kneecaps when he gets up.  Walking up the stairs is more of a problem walking down.  Eye exam is rescheduled for next month.  Was unable to tolerate terbinafine for onychomycosis.  Past Medical History:  Diagnosis Date  . Abnormal CT scan 08/2012   R axilla adenopathy  . 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 Abnormal RBC 09/26/12   Elliptocytes  . Hx of cardiovascular stress test    ETT-Myoview 7/14:  Normal, EF 54%, no ischemia  . Hypertension   . LVH (left ventricular hypertrophy)    08/2712 echo-EF 55-60%, moderate LVH and mild LA dilatation.   . Microcytosis    MCV 65  . Mild sleep apnea    a. sleep study 9/14:  mild OSA, AHI 10/hour; O2 desat nadir 88% => recommend weight loss  . Prediabetes    A1C 6.1% 08/2012  . Uncontrolled type 2 diabetes mellitus with microalbuminuria ([place]) 12/31/2018    Past Surgical History:  Procedure Laterality Date  . HAND SURGERY Right 2000   Right  hand, 5years ago    Family History  Problem Relation Age of Onset  . Heart disease Mother   . Hypertension Mother   . Hypertension Maternal Grandfather   . Heart attack Neg Hx     Social History   Socioeconomic History  . Marital status: Married    Spouse name: Not on file  . Number of children: 2  . Years of education: 12 grade  . Highest education level: Not on file  Occupational History  . Not on file  Tobacco Use  . Smoking status: Never Smoker  . Smokeless tobacco: Never Used  Vaping Use  . Vaping Use: Never used  Substance and Sexual Activity  . Alcohol use: No  . Drug use: No  . Sexual activity: Not on file  Other Topics Concern  . Not on file  Social History Narrative  . Not on file   Social Determinants of Health   Financial Resource Strain:   . Difficulty of Paying Living Expenses: Not on file  Food Insecurity:   . Worried About Charity fundraiser in the Last Year: Not on file  . Ran Out of Food in the Last Year: Not on file  Transportation Needs:   . Lack of Transportation (Medical): Not on file  . Lack of Transportation (Non-Medical): Not  on file  Physical Activity:   . Days of Exercise per Week: Not on file  . Minutes of Exercise per Session: Not on file  Stress:   . Feeling of Stress : Not on file  Social Connections:   . Frequency of Communication with Friends and Family: Not on file  . Frequency of Social Gatherings with Friends and Family: Not on file  . Attends Religious Services: Not on file  . Active Member of Clubs or Organizations: Not on file  . Attends Archivist Meetings: Not on file  . Marital Status: Not on file  Intimate Partner Violence:   . Fear of Current or Ex-Partner: Not on file  . Emotionally Abused: Not on file  . Physically Abused: Not on file  . Sexually Abused: Not on file    Outpatient Medications Prior to Visit  Medication Sig Dispense Refill  . Accu-Chek FastClix Lancets MISC USE 1 TO CHECK GLUCOSE  TWICE DAILY    . ACCU-CHEK GUIDE test strip USE 1 TWICE DAILY    . blood glucose meter kit and supplies KIT Use to check FSBS BID. (Dx: E11.29, E11.65, R80.9). 1 each 0  . hydrochlorothiazide (HYDRODIURIL) 25 MG tablet Take 1 tablet (25 mg total) by mouth daily. 90 tablet 3  . amLODipine (NORVASC) 10 MG tablet Take 1 tablet (10 mg total) by mouth daily. 90 tablet 1  . EPINEPHrine (EPIPEN 2-PAK) 0.3 mg/0.3 mL IJ SOAJ injection Inject 0.3 mLs (0.3 mg total) into the muscle as needed for anaphylaxis. 2 each 0  . lisinopril (ZESTRIL) 40 MG tablet Take 1 tablet (40 mg total) by mouth daily. 90 tablet 3  . lovastatin (MEVACOR) 20 MG tablet Take 1 tablet (20 mg total) by mouth at bedtime. 30 tablet 3  . metFORMIN (GLUCOPHAGE) 500 MG tablet Take 1 tablet (500 mg total) by mouth 2 (two) times daily with a meal. 180 tablet 1  . Semaglutide,0.25 or 0.5MG /DOS, (OZEMPIC, 0.25 OR 0.5 MG/DOSE,) 2 MG/1.5ML SOPN Inject 0.5 mg into the skin once a week. Inject 0.25mg  into the skin weekly x4 weeks then increase to 0.5mg  weekly. (Patient not taking: Reported on 01/25/2020) 4 pen 3  . terbinafine (LAMISIL) 250 MG tablet Take 1 tablet (250 mg total) by mouth daily. (Patient not taking: Reported on 01/25/2020) 90 tablet 0   No facility-administered medications prior to visit.    Allergies  Allergen Reactions  . Bee Venom Swelling  . Other Other (See Comments)    Squash- hives    ROS Review of Systems  Constitutional: Negative.   HENT: Negative.   Eyes: Negative for photophobia and visual disturbance.  Respiratory: Negative.   Cardiovascular: Negative.   Gastrointestinal: Negative.   Endocrine: Negative for polyphagia and polyuria.  Genitourinary: Negative for difficulty urinating and hematuria.  Musculoskeletal: Positive for arthralgias.  Skin: Negative for pallor and rash.  Allergic/Immunologic: Negative for immunocompromised state.  Neurological: Negative for speech difficulty and light-headedness.    Hematological: Does not bruise/bleed easily.  Psychiatric/Behavioral: Negative.       Objective:    Physical Exam Vitals and nursing note reviewed.  Constitutional:      General: He is not in acute distress.    Appearance: Normal appearance. He is obese. He is not ill-appearing, toxic-appearing or diaphoretic.  HENT:     Head: Normocephalic and atraumatic.     Right Ear: Tympanic membrane, ear canal and external ear normal.     Left Ear: Tympanic membrane, ear canal and external  ear normal.  Eyes:     General: No scleral icterus.       Right eye: No discharge.        Left eye: No discharge.     Conjunctiva/sclera: Conjunctivae normal.     Pupils: Pupils are equal, round, and reactive to light.  Cardiovascular:     Rate and Rhythm: Normal rate and regular rhythm.  Pulmonary:     Effort: Pulmonary effort is normal.     Breath sounds: Normal breath sounds.  Abdominal:     General: Bowel sounds are normal.  Musculoskeletal:     Right hand: Deformity present. Normal range of motion. Normal strength.       Arms:     Cervical back: No rigidity or tenderness.     Right lower leg: No edema.     Left lower leg: No edema.  Lymphadenopathy:     Cervical: No cervical adenopathy.  Skin:    General: Skin is warm and dry.  Neurological:     Mental Status: He is alert and oriented to person, place, and time.  Psychiatric:        Mood and Affect: Mood normal.        Behavior: Behavior normal.     BP 138/82   Pulse 71   Temp 97.8 F (36.6 C) (Tympanic)   Wt 273 lb 3.2 oz (123.9 kg)   SpO2 98%   BMI 38.10 kg/m  Wt Readings from Last 3 Encounters:  01/25/20 273 lb 3.2 oz (123.9 kg)  09/01/19 260 lb 12.8 oz (118.3 kg)  05/18/19 258 lb 3.2 oz (117.1 kg)     Health Maintenance Due  Topic Date Due  . Hepatitis C Screening  Never done  . OPHTHALMOLOGY EXAM  Never done  . COVID-19 Vaccine (1) Never done  . HIV Screening  Never done  . INFLUENZA VACCINE  Never done     There are no preventive care reminders to display for this patient.  Lab Results  Component Value Date   TSH 1.162 09/26/2012   Lab Results  Component Value Date   WBC 5.3 11/30/2019   HGB 12.3 (L) 11/30/2019   HCT 41.4 11/30/2019   MCV 68.4 (L) 11/30/2019   PLT 235 11/30/2019   Lab Results  Component Value Date   NA 135 09/01/2019   K 4.1 09/01/2019   CO2 28 09/01/2019   GLUCOSE 73 09/01/2019   BUN 18 09/01/2019   CREATININE 1.16 09/01/2019   BILITOT 0.4 11/30/2019   ALKPHOS 96 05/18/2019   AST 21 11/30/2019   ALT 11 11/30/2019   PROT 7.2 11/30/2019   ALBUMIN 4.3 05/18/2019   CALCIUM 9.4 09/01/2019   ANIONGAP 10 05/25/2015   GFR 83.90 09/01/2019   Lab Results  Component Value Date   CHOL 131 05/18/2019   Lab Results  Component Value Date   HDL 28.20 (L) 05/18/2019   Lab Results  Component Value Date   LDLCALC 88 05/18/2019   Lab Results  Component Value Date   TRIG 74.0 05/18/2019   Lab Results  Component Value Date   CHOLHDL 5 05/18/2019   Lab Results  Component Value Date   HGBA1C 6.6 (H) 09/01/2019      Assessment & Plan:   Problem List Items Addressed This Visit      Cardiovascular and Mediastinum   Essential hypertension - Primary   Relevant Medications   EPINEPHrine (EPIPEN 2-PAK) 0.3 mg/0.3 mL IJ SOAJ injection   amLODipine (NORVASC)  10 MG tablet   lisinopril (ZESTRIL) 40 MG tablet   lovastatin (MEVACOR) 20 MG tablet   Other Relevant Orders   CBC   Comprehensive metabolic panel   Urinalysis, Routine w reflex microscopic   Microalbumin / creatinine urine ratio     Endocrine   Hyperlipidemia associated with type 2 diabetes mellitus (HCC)   Relevant Medications   lisinopril (ZESTRIL) 40 MG tablet   lovastatin (MEVACOR) 20 MG tablet   metFORMIN (GLUCOPHAGE) 500 MG tablet   Other Relevant Orders   Comprehensive metabolic panel   LDL cholesterol, direct   Type 2 diabetes mellitus without complication, without long-term  current use of insulin (HCC)   Relevant Medications   lisinopril (ZESTRIL) 40 MG tablet   lovastatin (MEVACOR) 20 MG tablet   metFORMIN (GLUCOPHAGE) 500 MG tablet   Other Relevant Orders   Comprehensive metabolic panel   Hemoglobin A1c   Urinalysis, Routine w reflex microscopic   Microalbumin / creatinine urine ratio     Musculoskeletal and Integument   Trigger ring finger of right hand   Relevant Orders   Ambulatory referral to Sports Medicine     Other   Hymenoptera allergy   Relevant Medications   EPINEPHrine (EPIPEN 2-PAK) 0.3 mg/0.3 mL IJ SOAJ injection   Anemia   Relevant Orders   CBC   Iron, TIBC and Ferritin Panel   B12 and Folate Panel   Pain in both knees   Relevant Orders   Ambulatory referral to Sports Medicine      Meds ordered this encounter  Medications  . EPINEPHrine (EPIPEN 2-PAK) 0.3 mg/0.3 mL IJ SOAJ injection    Sig: Inject 0.3 mg into the muscle as needed for anaphylaxis.    Dispense:  2 each    Refill:  0  . amLODipine (NORVASC) 10 MG tablet    Sig: Take 1 tablet (10 mg total) by mouth daily.    Dispense:  90 tablet    Refill:  1  . lisinopril (ZESTRIL) 40 MG tablet    Sig: Take 1 tablet (40 mg total) by mouth daily.    Dispense:  90 tablet    Refill:  3  . lovastatin (MEVACOR) 20 MG tablet    Sig: Take 1 tablet (20 mg total) by mouth at bedtime.    Dispense:  30 tablet    Refill:  3  . metFORMIN (GLUCOPHAGE) 500 MG tablet    Sig: Take 1 tablet (500 mg total) by mouth 2 (two) times daily with a meal.    Dispense:  180 tablet    Refill:  1    Follow-up: Return in about 6 months (around 07/25/2020).   Stressed the importance of weight loss for control of his blood pressure diabetes and elevated cholesterol.  He said that he would do so.  Agrees to go for sports medicine referral for his hand and knees.  Small no edema today do not believe the swelling in his legs is due to the amlodipine at this point.  We will keep that in mind.  His legs  are in a dependent position throughout the day for his job.  Encouraged him to continue to wear his kneelength socks. Libby Maw, MD

## 2020-01-26 LAB — MICROALBUMIN / CREATININE URINE RATIO
Creatinine,U: 149.2 mg/dL
Microalb Creat Ratio: 7.9 mg/g (ref 0.0–30.0)
Microalb, Ur: 11.8 mg/dL — ABNORMAL HIGH (ref 0.0–1.9)

## 2020-01-26 LAB — URINALYSIS, ROUTINE W REFLEX MICROSCOPIC
Bilirubin Urine: NEGATIVE
Hgb urine dipstick: NEGATIVE
Ketones, ur: NEGATIVE
Leukocytes,Ua: NEGATIVE
Nitrite: NEGATIVE
RBC / HPF: NONE SEEN (ref 0–?)
Specific Gravity, Urine: >=1.03 — AB (ref 1.000–1.030)
Total Protein, Urine: NEGATIVE
Urine Glucose: NEGATIVE
Urobilinogen, UA: 0.2 (ref 0.0–1.0)
pH: 5.5 (ref 5.0–8.0)

## 2020-01-26 LAB — LDL CHOLESTEROL, DIRECT: Direct LDL: 77 mg/dL

## 2020-01-26 LAB — COMPREHENSIVE METABOLIC PANEL
ALT: 12 U/L (ref 0–53)
AST: 16 U/L (ref 0–37)
Albumin: 4.1 g/dL (ref 3.5–5.2)
Alkaline Phosphatase: 92 U/L (ref 39–117)
BUN: 11 mg/dL (ref 6–23)
CO2: 27 mEq/L (ref 19–32)
Calcium: 9.2 mg/dL (ref 8.4–10.5)
Chloride: 103 mEq/L (ref 96–112)
Creatinine, Ser: 1.07 mg/dL (ref 0.40–1.50)
GFR: 86.18 mL/min (ref >60.00)
Glucose, Bld: 103 mg/dL — ABNORMAL HIGH (ref 70–99)
Potassium: 3.7 mEq/L (ref 3.5–5.1)
Sodium: 137 mEq/L (ref 135–145)
Total Bilirubin: 0.4 mg/dL (ref 0.2–1.2)
Total Protein: 6.9 g/dL (ref 6.0–8.3)

## 2020-01-26 LAB — CBC
HCT: 40.6 % (ref 39.0–52.0)
Hemoglobin: 13 g/dL (ref 13.0–17.0)
MCHC: 31.9 g/dL (ref 30.0–36.0)
MCV: 65 fl — ABNORMAL LOW (ref 78.0–100.0)
Platelets: 235 10*3/uL (ref 150.0–400.0)
RBC: 6.25 Mil/uL — ABNORMAL HIGH (ref 4.22–5.81)
RDW: 16.7 % — ABNORMAL HIGH (ref 11.5–15.5)
WBC: 5 10*3/uL (ref 4.0–10.5)

## 2020-01-26 LAB — IRON,TIBC AND FERRITIN PANEL
%SAT: 28 % (calc) (ref 20–48)
Ferritin: 193 ng/mL (ref 38–380)
Iron: 101 ug/dL (ref 50–180)
TIBC: 366 mcg/dL (calc) (ref 250–425)

## 2020-01-26 LAB — B12 AND FOLATE PANEL
Folate: 12.1 ng/mL (ref >5.9)
Vitamin B-12: 474 pg/mL (ref 211–911)

## 2020-01-26 LAB — HEMOGLOBIN A1C: Hgb A1c MFr Bld: 7.6 % — ABNORMAL HIGH (ref 4.6–6.5)

## 2020-02-01 ENCOUNTER — Ambulatory Visit: Payer: 59 | Admitting: Family Medicine

## 2020-02-01 ENCOUNTER — Ambulatory Visit: Payer: 59 | Admitting: Podiatry

## 2020-02-08 ENCOUNTER — Encounter: Payer: Self-pay | Admitting: Family Medicine

## 2020-02-08 ENCOUNTER — Other Ambulatory Visit: Payer: Self-pay

## 2020-02-08 ENCOUNTER — Ambulatory Visit: Payer: Self-pay

## 2020-02-08 ENCOUNTER — Ambulatory Visit (INDEPENDENT_AMBULATORY_CARE_PROVIDER_SITE_OTHER): Payer: 59 | Admitting: Family Medicine

## 2020-02-08 VITALS — BP 143/90 | HR 77 | Ht 74.0 in | Wt 273.0 lb

## 2020-02-08 DIAGNOSIS — M222X2 Patellofemoral disorders, left knee: Secondary | ICD-10-CM | POA: Diagnosis not present

## 2020-02-08 DIAGNOSIS — M65341 Trigger finger, right ring finger: Secondary | ICD-10-CM | POA: Diagnosis not present

## 2020-02-08 DIAGNOSIS — M222X1 Patellofemoral disorders, right knee: Secondary | ICD-10-CM

## 2020-02-08 MED ORDER — DICLOFENAC SODIUM 1 % EX GEL: 4.0000 g | Freq: Four times a day (QID) | CUTANEOUS | 11 refills | Status: DC

## 2020-02-08 MED ORDER — TRIAMCINOLONE ACETONIDE 40 MG/ML IJ SUSP
40.0000 mg | Freq: Once | INTRAMUSCULAR | Status: AC
  Administered 2020-02-08: 40 mg via INTRA_ARTICULAR

## 2020-02-08 NOTE — Patient Instructions (Signed)
Nice to meet you Please try the splint at night on the hand. Please try the exercises  Please try the voltaren  Please send me a message in MyChart with any questions or updates.  Please see me back in 4 weeks.   --Dr. Raeford Razor

## 2020-02-08 NOTE — Progress Notes (Signed)
Darren Little - 39 y.o. male MRN [id number]  Date of birth: 08/17/1980  SUBJECTIVE:  Including CC & ROS.  Chief Complaint  Patient presents with  . Knee Pain    bilateral  . Hand Pain    right ring    [name] is a 39 y.o. male that is with right hand pain and bilateral knee pain.  He has a history of a transmission falling on his right hand.  Now he currently has triggering of the right ring finger.  Symptoms seem to be worse more with activity.  Is also having bilateral knee pain.  These have been occurring for several years.  No history of surgery.  It is worse when getting up from a seated position.   Review of Systems See HPI   HISTORY: Past Medical, Surgical, Social, and Family History Reviewed & Updated per EMR.   Pertinent Historical Findings include:  Past Medical History:  Diagnosis Date  . Abnormal CT scan 08/2012   R axilla adenopathy  . 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 Abnormal RBC 09/26/12   Elliptocytes  . Hx of cardiovascular stress test    ETT-Myoview 7/14:  Normal, EF 54%, no ischemia  . Hypertension   . LVH (left ventricular hypertrophy)    08/2712 echo-EF 55-60%, moderate LVH and mild LA dilatation.   . Microcytosis    MCV 65  . Mild sleep apnea    a. sleep study 9/14:  mild OSA, AHI 10/hour; O2 desat nadir 88% => recommend weight loss  . Prediabetes    A1C 6.1% 08/2012  . Uncontrolled type 2 diabetes mellitus with microalbuminuria ([place]) 12/31/2018    Past Surgical History:  Procedure Laterality Date  . HAND SURGERY Right 2000   Right hand, 5years ago    Family History  Problem Relation Age of Onset  . Heart disease Mother   . Hypertension Mother   . Hypertension Maternal Grandfather   . Heart attack Neg Hx     Social History   Socioeconomic History  . Marital status: Married    Spouse name: Not on file  . Number of children: 2  . Years of education: 12 grade  . Highest education level: Not on file    Occupational History  . Not on file  Tobacco Use  . Smoking status: Never Smoker  . Smokeless tobacco: Never Used  Vaping Use  . Vaping Use: Never used  Substance and Sexual Activity  . Alcohol use: No  . Drug use: No  . Sexual activity: Not on file  Other Topics Concern  . Not on file  Social History Narrative  . Not on file   Social Determinants of Health   Financial Resource Strain:   . Difficulty of Paying Living Expenses: Not on file  Food Insecurity:   . Worried About Charity fundraiser in the Last Year: Not on file  . Ran Out of Food in the Last Year: Not on file  Transportation Needs:   . Lack of Transportation (Medical): Not on file  . Lack of Transportation (Non-Medical): Not on file  Physical Activity:   . Days of Exercise per Week: Not on file  . Minutes of Exercise per Session: Not on file  Stress:   . Feeling of Stress : Not on file  Social Connections:   . Frequency of Communication with Friends and Family: Not on file  . Frequency of Social Gatherings with Friends and Family:  Not on file  . Attends Religious Services: Not on file  . Active Member of Clubs or Organizations: Not on file  . Attends Archivist Meetings: Not on file  . Marital Status: Not on file  Intimate Partner Violence:   . Fear of Current or Ex-Partner: Not on file  . Emotionally Abused: Not on file  . Physically Abused: Not on file  . Sexually Abused: Not on file     PHYSICAL EXAM:  VS: BP (!) 143/90   Pulse 77   Ht 6\' 2"  (1.88 m)   Wt 273 lb (123.8 kg)   BMI 35.05 kg/m  Physical Exam Gen: NAD, alert, cooperative with exam, well-appearing MSK:  Right hand: Triggering of the right ring finger. Counsel ration over the ulnar aspect in the mid crease. Normal grip strength. No signs of atrophy. Right and left knee: No effusion. Normal range of motion. Normal strength resistance. Pain with patellar grind. Neurovascularly intact  Limited ultrasound: Right  hand, right knee and left knee:  Right fourth digit: There appears to be enlargement of the A2 pulley and A4 pulley. No changes of the PIP joint or MCP joint.  Left knee: Trace effusion. Normal-appearing quadricep and patellar tendon. Normal-appearing medial joint space and lateral joint space.  Right knee: No effusion. Normal-appearing quadricep and patellar tendon. Normal-appearing medial and lateral joint space  Summary: Findings suggestive of trigger finger of fourth right digit.  Normal structures of the left and right knee.  Ultrasound and interpretation by Clearance Coots, MD   Aspiration/Injection Procedure Note GIANNI MIHALIK 1980/06/23  Procedure: Injection Indications: Right fourth trigger finger  Procedure Details Consent: Risks of procedure as well as the alternatives and risks of each were explained to the (patient/caregiver).  Consent for procedure obtained. Time Out: Verified patient identification, verified procedure, site/side was marked, verified correct patient position, special equipment/implants available, medications/allergies/relevent history reviewed, required imaging and test results available.  Performed.  The area was cleaned with iodine and alcohol swabs.    The right ring finger trigger finger was injected using 0.5 cc's of 40 mg Kenalog and 0.5 cc's of 0.25% bupivacaine with a 25 1 1/2" needle.  Ultrasound was used. Images were obtained in long views showing the injection.     A sterile dressing was applied.  Patient did tolerate procedure well.    ASSESSMENT & PLAN:   Patellofemoral pain syndrome of both knees No significant structural changes with either knee.   -Counseled on home exercise therapy and supportive care. -Voltaren. -Could consider injection or physical therapy.  Trigger ring finger of right hand Has increased nodularity of the flexor surface of the A2 and A4 pulley.  Unclear if is trauma from the transmission following  his hand is contributing. -Counseled on home exercise therapy and supportive care. -Injection. -Splint. -Could consider further imaging if needed.

## 2020-02-09 NOTE — Assessment & Plan Note (Signed)
No significant structural changes with either knee.   -Counseled on home exercise therapy and supportive care. -Voltaren. -Could consider injection or physical therapy.

## 2020-02-09 NOTE — Assessment & Plan Note (Signed)
Has increased nodularity of the flexor surface of the A2 and A4 pulley.  Unclear if is trauma from the transmission following his hand is contributing. -Counseled on home exercise therapy and supportive care. -Injection. -Splint. -Could consider further imaging if needed.

## 2020-02-29 ENCOUNTER — Ambulatory Visit: Payer: 59 | Admitting: Family Medicine

## 2020-03-06 ENCOUNTER — Other Ambulatory Visit: Payer: Self-pay | Admitting: Cardiology

## 2020-03-06 DIAGNOSIS — I10 Essential (primary) hypertension: Secondary | ICD-10-CM

## 2020-03-14 ENCOUNTER — Ambulatory Visit: Payer: 59 | Admitting: Family Medicine

## 2020-03-14 NOTE — Progress Notes (Deleted)
  Darren Little - 39 y.o. male MRN [id number]  Date of birth: 24-Feb-1981  SUBJECTIVE:  Including CC & ROS.  No chief complaint on file.   Darren Little is a 39 y.o. male that is  ***.  ***   Review of Systems See HPI   HISTORY: Past Medical, Surgical, Social, and Family History Reviewed & Updated per EMR.   Pertinent Historical Findings include:  Past Medical History:  Diagnosis Date  . Abnormal CT scan 08/2012   R axilla adenopathy  . 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 Abnormal RBC 09/26/12   Elliptocytes  . Hx of cardiovascular stress test    ETT-Myoview 7/14:  Normal, EF 54%, no ischemia  . Hypertension   . LVH (left ventricular hypertrophy)    08/2712 echo-EF 55-60%, moderate LVH and mild LA dilatation.   . Microcytosis    MCV 65  . Mild sleep apnea    a. sleep study 9/14:  mild OSA, AHI 10/hour; O2 desat nadir 88% => recommend weight loss  . Prediabetes    A1C 6.1% 08/2012  . Uncontrolled type 2 diabetes mellitus with microalbuminuria ([place]) 12/31/2018    Past Surgical History:  Procedure Laterality Date  . HAND SURGERY Right 2000   Right hand, 5years ago    Family History  Problem Relation Age of Onset  . Heart disease Mother   . Hypertension Mother   . Hypertension Maternal Grandfather   . Heart attack Neg Hx     Social History   Socioeconomic History  . Marital status: Married    Spouse name: Not on file  . Number of children: 2  . Years of education: 12 grade  . Highest education level: Not on file  Occupational History  . Not on file  Tobacco Use  . Smoking status: Never Smoker  . Smokeless tobacco: Never Used  Vaping Use  . Vaping Use: Never used  Substance and Sexual Activity  . Alcohol use: No  . Drug use: No  . Sexual activity: Not on file  Other Topics Concern  . Not on file  Social History Narrative  . Not on file   Social Determinants of Health   Financial Resource Strain: Not on file  Food Insecurity:  Not on file  Transportation Needs: Not on file  Physical Activity: Not on file  Stress: Not on file  Social Connections: Not on file  Intimate Partner Violence: Not on file     PHYSICAL EXAM:  VS: There were no vitals taken for this visit. Physical Exam Gen: NAD, alert, cooperative with exam, well-appearing MSK:  ***      ASSESSMENT & PLAN:   No problem-specific Assessment & Plan notes found for this encounter.

## 2020-04-14 ENCOUNTER — Telehealth: Payer: Self-pay | Admitting: Neurology

## 2020-04-14 NOTE — Telephone Encounter (Signed)
I returned the call to the patient. He answered no to the recall questions. He has had the machine serviced by [facility]. He will continue to use it.   Follow up appt has been scheduled for him in April 2022. He has also been added to the wait list.

## 2020-04-14 NOTE — Telephone Encounter (Signed)
Pt. states his [employer] Negus CPAP machine has been recalled & he has a lot of concerns & questions pertaining to this. 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 He asks to be called back asap. Please advise.

## 2020-05-15 ENCOUNTER — Other Ambulatory Visit: Payer: Self-pay | Admitting: Cardiology

## 2020-05-15 DIAGNOSIS — I10 Essential (primary) hypertension: Secondary | ICD-10-CM

## 2020-05-30 ENCOUNTER — Encounter: Payer: 59 | Admitting: Family Medicine

## 2020-06-19 ENCOUNTER — Encounter: Payer: Self-pay | Admitting: *Deleted

## 2020-06-19 ENCOUNTER — Other Ambulatory Visit: Payer: Self-pay

## 2020-06-19 ENCOUNTER — Ambulatory Visit (INDEPENDENT_AMBULATORY_CARE_PROVIDER_SITE_OTHER): Payer: 59

## 2020-06-19 ENCOUNTER — Ambulatory Visit
Admission: EM | Admit: 2020-06-19 | Discharge: 2020-06-19 | Disposition: A | Discharge status: Home / Self Care | Payer: 59 | Attending: Emergency Medicine | Admitting: Emergency Medicine

## 2020-06-19 DIAGNOSIS — W19XXXA Unspecified fall, initial encounter: Secondary | ICD-10-CM | POA: Diagnosis not present

## 2020-06-19 DIAGNOSIS — M25561 Pain in right knee: Secondary | ICD-10-CM

## 2020-06-19 MED ORDER — NAPROXEN 500 MG PO TABS: 500.0000 mg | ORAL_TABLET | Freq: Two times a day (BID) | ORAL | 0 refills | Status: DC

## 2020-06-19 MED ORDER — KETOROLAC TROMETHAMINE 30 MG/ML IJ SOLN
30.0000 mg | Freq: Once | INTRAMUSCULAR | Status: AC
  Administered 2020-06-19: 30 mg via INTRAMUSCULAR

## 2020-06-19 NOTE — ED Provider Notes (Signed)
[facility]    CSN: [id number] Arrival date & time: 06/19/20  1458      History   Chief Complaint Chief Complaint  Patient presents with  . Knee Pain    HPI Darren Little is a 40 y.o. male presenting today for evaluation of right knee pain.  Patient reports that approximately 2 to 3 weeks ago he fell off of his truck as he stepped on a stick.  He landed on his left side.  He has had discomfort stemming from his right knee but radiates into his upper leg.  He has had intermittent swelling which improves with rest.  Pain has persisted and reports a lot of pain with weightbearing.  Denies history of DVT/PE.  Denies any tobacco use.  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  Using Tylenol without relief.  HPI  Past Medical History:  Diagnosis Date  . Abnormal CT scan 08/2012   R axilla adenopathy  . 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 Abnormal RBC 09/26/12   Elliptocytes  . Hx of cardiovascular stress test    ETT-Myoview 7/14:  Normal, EF 54%, no ischemia  . Hypertension   . LVH (left ventricular hypertrophy)    08/2712 echo-EF 55-60%, moderate LVH and mild LA dilatation.   . Microcytosis    MCV 65  . Mild sleep apnea    a. sleep study 9/14:  mild OSA, AHI 10/hour; O2 desat nadir 88% => recommend weight loss  . Prediabetes    A1C 6.1% 08/2012  . Uncontrolled type 2 diabetes mellitus with microalbuminuria ([place]) 12/31/2018    Patient Active Problem List   Diagnosis Date Noted  . 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  . Type 2 diabetes mellitus without complication, without long-term current use of insulin ([place]) 01/25/2020  . Anemia 01/25/2020  . Patellofemoral pain syndrome of both knees 01/25/2020  . Trigger ring finger of right hand 01/25/2020  . Encounter for Department of Transportation (DOT) examination for trucking license 44/05/4740   . Hyperlipidemia associated with type 2 diabetes mellitus ([place]) 12/31/2018  . Essential hypertension 11/07/2012  . Hypertensive emergency 09/27/2012  . Microcytosis 09/27/2012  . Abnormal CT of the chest 09/27/2012  . Abnormal platelets ([place]) 09/27/2012  . LVH (left ventricular hypertrophy) 09/27/2012  . Abnormal RBC morphology 09/27/2012  . Pain of left calf 09/27/2012  . Class 2 severe obesity due to excess calories with serious comorbidity and body mass index (BMI) of 37.0 to 37.9 in adult [facility]) 09/27/2012    Past Surgical History:  Procedure Laterality Date  . HAND SURGERY Right 2000   Right hand, 5years ago       Home Medications    Prior to Admission medications   Medication Sig Start Date End Date Taking? Authorizing Provider  amLODipine (NORVASC) 10 MG tablet Take 1 tablet (10 mg total) by mouth daily. 01/25/20  Yes Libby Maw, MD  lisinopril (ZESTRIL) 40 MG tablet Take 1 tablet (40 mg total) by mouth daily. 01/25/20  Yes Libby Maw, MD  metFORMIN (GLUCOPHAGE) 500 MG tablet Take 1 tablet (500 mg total) by mouth 2 (two) times daily with a meal. 01/25/20  Yes Libby Maw, MD  naproxen (NAPROSYN) 500 MG tablet Take 1 tablet (500 mg total) by mouth 2 (two) times daily. 06/19/20  Yes Khia Dieterich, [place] C, PA-C  Accu-Chek FastClix Lancets MISC USE 1 TO CHECK GLUCOSE TWICE DAILY 01/09/19   [provider]  ACCU-CHEK GUIDE test strip USE 1 TWICE DAILY 01/09/19   [provider]  blood glucose meter kit and supplies KIT Use to check FSBS BID. (Dx: E11.29, E11.65, R80.9). 01/07/19   Ladell Pier, MD  EPINEPHrine (EPIPEN 2-PAK) 0.3 mg/0.3 mL IJ SOAJ injection Inject 0.3 mg into the muscle as needed for anaphylaxis. 01/25/20   Libby Maw, MD  hydrochlorothiazide (HYDRODIURIL) 25 MG tablet Take 1 tablet (25 mg total) by mouth daily. 09/01/19 06/19/20  Libby Maw, MD  lovastatin (MEVACOR) 20 MG tablet Take 1 tablet  (20 mg total) by mouth at bedtime. 01/25/20 06/19/20  Libby Maw, MD    Family History Family History  Problem Relation Age of Onset  . Heart disease Mother   . Hypertension Mother   . Hypertension Maternal Grandfather   . Heart attack Neg Hx     Social History Social History   Tobacco Use  . Smoking status: Never Smoker  . Smokeless tobacco: Never Used  Vaping Use  . Vaping Use: Never used  Substance Use Topics  . Alcohol use: Never  . Drug use: Never     Allergies   Bee venom and Other   Review of Systems Review of Systems  Constitutional: Negative for fatigue and fever.  Eyes: Negative for redness, itching and visual disturbance.  Respiratory: Negative for shortness of breath.   Cardiovascular: Negative for chest pain and leg swelling.  Gastrointestinal: Negative for nausea and vomiting.  Musculoskeletal: Positive for arthralgias and joint swelling. Negative for myalgias.  Skin: Negative for color change, rash and wound.  Neurological: Negative for dizziness, syncope, weakness, light-headedness and headaches.     Physical Exam Triage Vital Signs ED Triage Vitals  Enc Vitals Group     BP 06/19/20 1516 (!) 156/99     Pulse Rate 06/19/20 1516 75     Resp 06/19/20 1516 18     Temp 06/19/20 1516 98.1 F (36.7 C)     Temp Source 06/19/20 1516 Oral     SpO2 06/19/20 1516 96 %     Weight --      Height --      Head Circumference --      Peak Flow --      Pain Score 06/19/20 1518 8     Pain Loc --      Pain Edu? --      Excl. in [place]? --    No data found.  Updated Vital Signs BP (!) 156/99 (BP Location: Left Arm)   Pulse 75   Temp 98.1 F (36.7 C) (Oral)   Resp 18   SpO2 96%   Visual Acuity Right Eye Distance:   Left Eye Distance:   Bilateral Distance:    Right Eye Near:   Left Eye Near:    Bilateral Near:     Physical Exam Vitals and nursing note reviewed.  Constitutional:      Appearance: He is well-developed.     Comments: No  acute distress  HENT:     Head: Normocephalic and atraumatic.     Nose: Nose normal.  Eyes:     Conjunctiva/sclera: Conjunctivae normal.  Cardiovascular:     Rate and Rhythm: Normal rate.  Pulmonary:     Effort: Pulmonary effort is normal. No respiratory distress.  Abdominal:     General: There is no distension.  Musculoskeletal:  General: Normal range of motion.     Cervical back: Neck supple.     Comments: Right leg: Mild swelling in suprapatellar area of right knee, no obvious deformity or discoloration, no erythema or warmth, tenderness diffusely over patella, medial lateral joint line, infrapatellar area and suprapatellar area, tenderness extending into the thigh, no popliteal swelling, calf swelling or calf tenderness No laxity appreciated  Skin:    General: Skin is warm and dry.  Neurological:     Mental Status: He is alert and oriented to person, place, and time.      UC Treatments / Results  Labs (all labs ordered are listed, but only abnormal results are displayed) Labs Reviewed - No data to display  EKG   Radiology DG Knee Complete 4 Views Right  Result Date: 06/19/2020 CLINICAL DATA:  Fall 1 month ago, worsening pain EXAM: RIGHT KNEE - COMPLETE 4+ VIEW COMPARISON:  None. FINDINGS: No acute bony abnormality. Specifically, no fracture, subluxation, or dislocation. Joint spaces maintained. No joint effusion. Vascular calcifications in the calf. IMPRESSION: No acute bony abnormality. Atherosclerosis. Electronically Signed   By: Rolm Baptise M.D.   On: 06/19/2020 15:42    Procedures Procedures (including critical care time)  Medications Ordered in UC Medications  ketorolac (TORADOL) 30 MG/ML injection 30 mg (has no administration in time range)    Initial Impression / Assessment and Plan / UC Course  I have reviewed the triage vital signs and the nursing notes.  Pertinent labs & imaging results that were available during my care of the patient were  reviewed by me and considered in my medical decision making (see chart for details).     X-ray negative for acute fracture.  Suspect likely inflammatory/sprain/contusion.  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  Exam not consistent with DVT.  Providing knee brace and recommending continued use of NSAIDs, providing Naprosyn, Toradol prior to discharge and follow-up with sports medicine if pain persisting or worsening  Discussed strict return precautions. Patient verbalized understanding and is agreeable with plan.  Final Clinical Impressions(s) / UC Diagnoses   Final diagnoses:  Acute pain of right knee     Discharge Instructions     X-ray normal Naprosyn twice daily with food for pain and swelling Ice and elevate when resting Follow-up with sports medicine if pain persisting Wear knee brace for support     ED Prescriptions    Medication Sig Dispense Auth. Provider   naproxen (NAPROSYN) 500 MG tablet Take 1 tablet (500 mg total) by mouth 2 (two) times daily. 30 tablet Markiyah Gahm, [place] C, PA-C     PDMP not reviewed this encounter.   Janith Lima, [place] 06/19/20 1549

## 2020-06-19 NOTE — ED Triage Notes (Signed)
Pt reports falling out of truck approx 1 month ago, causing some right leg pain; states had XR in [place] approx 1 wk later and was told it was fine, and had been feeling better.  Over past 3 wks having significantly worse pain, painful weightbearing and difficulty going upstairs.

## 2020-06-19 NOTE — Discharge Instructions (Addendum)
X-ray normal Naprosyn twice daily with food for pain and swelling Ice and elevate when resting Follow-up with sports medicine if pain persisting Wear knee brace for support

## 2020-07-13 ENCOUNTER — Encounter: Payer: 59 | Admitting: Family Medicine

## 2020-07-14 ENCOUNTER — Ambulatory Visit: Payer: Self-pay | Admitting: Adult Health

## 2020-07-22 ENCOUNTER — Other Ambulatory Visit: Payer: Self-pay | Admitting: Cardiology

## 2020-07-22 DIAGNOSIS — I10 Essential (primary) hypertension: Secondary | ICD-10-CM

## 2020-07-25 ENCOUNTER — Telehealth: Payer: Self-pay | Admitting: Family Medicine

## 2020-07-25 ENCOUNTER — Ambulatory Visit: Payer: 59 | Admitting: Family Medicine

## 2020-07-25 NOTE — Telephone Encounter (Signed)
Pt asked if Dr. Ethelene Hal can order diabetic test strips for AccuCheck Guide meter. Pt tests twice a day. He is paying out of pocket since no RX on file.   [facility] ([address] [place] [address] Phone:  [phone]  Fax:  [phone]

## 2020-07-26 ENCOUNTER — Other Ambulatory Visit: Payer: Self-pay

## 2020-07-26 MED ORDER — ACCU-CHEK GUIDE VI STRP: ORAL_STRIP | 0 refills | Status: DC

## 2020-07-26 NOTE — Telephone Encounter (Signed)
Requested Rx sent in  

## 2020-08-08 ENCOUNTER — Ambulatory Visit: Payer: 59 | Admitting: Family Medicine

## 2020-08-22 ENCOUNTER — Ambulatory Visit: Payer: 59 | Admitting: Family Medicine

## 2020-09-01 ENCOUNTER — Telehealth: Payer: Self-pay | Admitting: Neurology

## 2020-09-01 NOTE — Telephone Encounter (Signed)
Pt called needing to discuss his upcoming appt with the RN. Pt states that he will be on the road on his appt day and not only that he has been having problems with his machine. Pt states it does not work for him and that he sleeps better without it. Please advise.

## 2020-09-05 NOTE — Telephone Encounter (Signed)
Confirmed 2:00 appointment time with patient.

## 2020-09-05 NOTE — Telephone Encounter (Signed)
Pt called back he sts he is out of town and will not be back in tomorrow for his f/u. Pt has been rescheduled for 10/17/20 at 200 pm. Pt reports he has not been using his CPAP- sts he would like to discuss having a repeat sleep study completed to evaluate need for CPAP. Pt sts he has lost weight and may not need the machine. Pt sts he feels like the CPAP keep him up at night and he does not rest well. St he has not been using his machine. Last day of use was in feb per [facility]. 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 Pt verbalized understanding.

## 2020-09-06 ENCOUNTER — Ambulatory Visit: Payer: Self-pay | Admitting: Adult Health

## 2020-09-12 ENCOUNTER — Other Ambulatory Visit: Payer: Self-pay

## 2020-09-12 ENCOUNTER — Ambulatory Visit (INDEPENDENT_AMBULATORY_CARE_PROVIDER_SITE_OTHER): Payer: 59 | Admitting: Family Medicine

## 2020-09-12 ENCOUNTER — Encounter: Payer: Self-pay | Admitting: Family Medicine

## 2020-09-12 VITALS — BP 158/92 | HR 72 | Temp 97.9°F | Ht 74.0 in | Wt 264.6 lb

## 2020-09-12 DIAGNOSIS — E785 Hyperlipidemia, unspecified: Secondary | ICD-10-CM | POA: Diagnosis not present

## 2020-09-12 DIAGNOSIS — E119 Type 2 diabetes mellitus without complications: Secondary | ICD-10-CM

## 2020-09-12 DIAGNOSIS — I10 Essential (primary) hypertension: Secondary | ICD-10-CM | POA: Diagnosis not present

## 2020-09-12 DIAGNOSIS — G4730 Sleep apnea, unspecified: Secondary | ICD-10-CM | POA: Diagnosis not present

## 2020-09-12 DIAGNOSIS — D509 Iron deficiency anemia, unspecified: Secondary | ICD-10-CM

## 2020-09-12 DIAGNOSIS — E1169 Type 2 diabetes mellitus with other specified complication: Secondary | ICD-10-CM | POA: Diagnosis not present

## 2020-09-12 LAB — URINALYSIS, ROUTINE W REFLEX MICROSCOPIC
Bilirubin Urine: NEGATIVE
Hgb urine dipstick: NEGATIVE
Ketones, ur: NEGATIVE
Leukocytes,Ua: NEGATIVE
Nitrite: NEGATIVE
RBC / HPF: NONE SEEN (ref 0–?)
Specific Gravity, Urine: 1.025 (ref 1.000–1.030)
Total Protein, Urine: 30 — AB
Urine Glucose: NEGATIVE
Urobilinogen, UA: 0.2 (ref 0.0–1.0)
pH: 6 (ref 5.0–8.0)

## 2020-09-12 MED ORDER — ACCU-CHEK GUIDE VI STRP: ORAL_STRIP | 0 refills | Status: DC

## 2020-09-12 MED ORDER — METFORMIN HCL ER 500 MG PO TB24: ORAL_TABLET | ORAL | 3 refills | Status: DC

## 2020-09-12 NOTE — Progress Notes (Addendum)
Established Patient Office Visit  Subjective:  Patient ID: Darren Little, male    DOB: [birth year]  Age: 40 y.o. MRN: [id number]  CC:  Chief Complaint  Patient presents with   Follow-up    Follow up on BP and DM patient states that he wakes up with headaches and Metformin makes him feel "weird" burning in chest then headaches follow. Blood sugars normal.     HPI Darren Little presents for follow-up of hypertension, type 2 diabetes, hyperlipidemia and sleep apnea.  He has been trying to use his CPAP machine but it has been difficult for him.  Scheduled to see sleep doctor soon.  There is some daytime sleepiness.  When this happens and he checks his sugar they have been normal.  Intentional weight loss of 9 pounds!  He self discontinued his lisinopril several months ago.  Has not been checking his blood pressure.  He has been having significant dental problems including gum disease.  Has experienced loose stool with metformin.   Past Medical History:  Diagnosis Date   Abnormal CT scan 08/2012   R axilla adenopathy   Abnormal platelets (HCC) 09/26/12   Large platelets   Abnormal RBC 09/26/12   Elliptocytes   Hx of cardiovascular stress test    ETT-Myoview 7/14:  Normal, EF 54%, no ischemia   Hypertension    LVH (left ventricular hypertrophy)    08/2712 echo-EF 55-60%, moderate LVH and mild LA dilatation.    Microcytosis    MCV 65   Mild sleep apnea    a. sleep study 9/14:  mild OSA, AHI 10/hour; O2 desat nadir 88% => recommend weight loss   Prediabetes    A1C 6.1% 08/2012   Uncontrolled type 2 diabetes mellitus with microalbuminuria ([place]) 12/31/2018    Past Surgical History:  Procedure Laterality Date   HAND SURGERY Right 2000   Right hand, 5years ago    Family History  Problem Relation Age of Onset   Heart disease Mother    Hypertension Mother    Hypertension Maternal Grandfather    Heart attack Neg Hx     Social History   Socioeconomic History   Marital  status: Married    Spouse name: Not on file   Number of children: 2   Years of education: 12 grade   Highest education level: Not on file  Occupational History   Not on file  Tobacco Use   Smoking status: Never   Smokeless tobacco: Never  Vaping Use   Vaping Use: Never used  Substance and Sexual Activity   Alcohol use: Never   Drug use: Never   Sexual activity: Not on file  Other Topics Concern   Not on file  Social History Narrative   Not on file   Social Determinants of Health   Financial Resource Strain: Not on file  Food Insecurity: Not on file  Transportation Needs: Not on file  Physical Activity: Not on file  Stress: Not on file  Social Connections: Not on file  Intimate Partner Violence: Not on file    Outpatient Medications Prior to Visit  Medication Sig Dispense Refill   Accu-Chek FastClix Lancets MISC USE 1 TO CHECK GLUCOSE TWICE DAILY     amLODipine (NORVASC) 10 MG tablet Take 1 tablet (10 mg total) by mouth daily. 90 tablet 1   blood glucose meter kit and supplies KIT Use to check FSBS BID. (Dx: E11.29, E11.65, R80.9). 1 each 0   EPINEPHrine (EPIPEN 2-PAK) 0.3  mg/0.3 mL IJ SOAJ injection Inject 0.3 mg into the muscle as needed for anaphylaxis. 2 each 0   ACCU-CHEK GUIDE test strip USE 2 TWICE DAILY 200 each 0   metFORMIN (GLUCOPHAGE) 500 MG tablet Take 1 tablet (500 mg total) by mouth 2 (two) times daily with a meal. 180 tablet 1   naproxen (NAPROSYN) 500 MG tablet Take 1 tablet (500 mg total) by mouth 2 (two) times daily. 30 tablet 0   lisinopril (ZESTRIL) 40 MG tablet Take 1 tablet (40 mg total) by mouth daily. (Patient not taking: Reported on 09/12/2020) 90 tablet 3   No facility-administered medications prior to visit.    Allergies  Allergen Reactions   Bee Venom Swelling   Other Other (See Comments)    Squash- hives    ROS Review of Systems  Constitutional:  Negative for appetite change, fever and unexpected weight change.  HENT:  Positive for  dental problem.   Eyes:  Negative for photophobia and visual disturbance.  Respiratory:  Positive for apnea.   Cardiovascular: Negative.   Gastrointestinal: Negative.   Endocrine: Negative for polyphagia and polyuria.  Genitourinary: Negative.   Musculoskeletal:  Negative for gait problem and joint swelling.  Skin:  Negative for pallor and rash.  Neurological: Negative.   Psychiatric/Behavioral: Negative.       Objective:    Physical Exam Vitals and nursing note reviewed.  Constitutional:      General: He is not in acute distress.    Appearance: Normal appearance. He is not ill-appearing, toxic-appearing or diaphoretic.  HENT:     Head: Normocephalic and atraumatic.     Right Ear: Tympanic membrane, ear canal and external ear normal.     Left Ear: Tympanic membrane, ear canal and external ear normal.     Mouth/Throat:     Mouth: Mucous membranes are dry.     Pharynx: Oropharynx is clear. No oropharyngeal exudate or posterior oropharyngeal erythema.  Eyes:     General: No scleral icterus.       Right eye: No discharge.        Left eye: No discharge.  Cardiovascular:     Rate and Rhythm: Normal rate and regular rhythm.  Pulmonary:     Effort: Pulmonary effort is normal.     Breath sounds: Normal breath sounds.  Abdominal:     General: Abdomen is flat. Bowel sounds are normal. There is no distension.     Palpations: There is no mass.     Tenderness: There is no abdominal tenderness. There is no guarding or rebound.     Hernia: No hernia is present.  Musculoskeletal:     Cervical back: No rigidity or tenderness.  Lymphadenopathy:     Cervical: No cervical adenopathy.  Skin:    General: Skin is warm and dry.  Neurological:     Mental Status: He is alert and oriented to person, place, and time.  Psychiatric:        Mood and Affect: Mood normal.        Behavior: Behavior normal.    BP (!) 158/92   Pulse 72   Temp 97.9 F (36.6 C) (Temporal)   Ht 6' 2"  (1.88 m)   Wt  264 lb 9.6 oz (120 kg)   SpO2 97%   BMI 33.97 kg/m  Wt Readings from Last 3 Encounters:  09/12/20 264 lb 9.6 oz (120 kg)  02/08/20 273 lb (123.8 kg)  01/25/20 273 lb 3.2 oz (123.9 kg)  Health Maintenance Due  Topic Date Due   Pneumococcal Vaccine 78-46 Years old (1 - PCV) Never done   OPHTHALMOLOGY EXAM  Never done   HIV Screening  Never done   Hepatitis C Screening  Never done   FOOT EXAM  05/17/2020    There are no preventive care reminders to display for this patient.  Lab Results  Component Value Date   TSH 0.83 09/12/2020   Lab Results  Component Value Date   WBC 4.1 09/12/2020   HGB 12.9 (L) 09/12/2020   HCT 40.5 09/12/2020   MCV 64.8 Repeated and verified X2. (L) 09/12/2020   PLT 220.0 09/12/2020   Lab Results  Component Value Date   NA 137 09/12/2020   K 3.9 09/12/2020   CO2 24 09/12/2020   GLUCOSE 89 09/12/2020   BUN 15 09/12/2020   CREATININE 1.09 09/12/2020   BILITOT 0.7 09/12/2020   ALKPHOS 89 09/12/2020   AST 16 09/12/2020   ALT 10 09/12/2020   PROT 7.3 09/12/2020   ALBUMIN 4.3 09/12/2020   CALCIUM 8.8 09/12/2020   ANIONGAP 10 05/25/2015   GFR 83.91 09/12/2020   Lab Results  Component Value Date   CHOL 144 09/12/2020   Lab Results  Component Value Date   HDL 31.50 (L) 09/12/2020   Lab Results  Component Value Date   LDLCALC 103 (H) 09/12/2020   Lab Results  Component Value Date   TRIG 49.0 09/12/2020   Lab Results  Component Value Date   CHOLHDL 5 09/12/2020   Lab Results  Component Value Date   HGBA1C 6.9 (H) 09/12/2020      Assessment & Plan:   Problem List Items Addressed This Visit       Cardiovascular and Mediastinum   Essential hypertension - Primary   Relevant Orders   CBC (Completed)   Comprehensive metabolic panel (Completed)   TSH (Completed)     Endocrine   Hyperlipidemia associated with type 2 diabetes mellitus (HCC)   Relevant Medications   metFORMIN (GLUCOPHAGE XR) 500 MG 24 hr tablet   Other  Relevant Orders   Comprehensive metabolic panel (Completed)   Lipid panel (Completed)   Type 2 diabetes mellitus without complication, without long-term current use of insulin (HCC)   Relevant Medications   ACCU-CHEK GUIDE test strip   metFORMIN (GLUCOPHAGE XR) 500 MG 24 hr tablet   Other Relevant Orders   Comprehensive metabolic panel (Completed)   Hemoglobin A1c (Completed)   Urinalysis, Routine w reflex microscopic (Completed)   Ambulatory referral to Ophthalmology   Other Visit Diagnoses     Sleep apnea, unspecified type       Microcytic anemia       Relevant Orders   Iron, TIBC and Ferritin Panel       Meds ordered this encounter  Medications   ACCU-CHEK GUIDE test strip    Sig: USE 2 TWICE DAILY    Dispense:  200 each    Refill:  0   metFORMIN (GLUCOPHAGE XR) 500 MG 24 hr tablet    Sig: Take one twice daily with meals.    Dispense:  180 tablet    Refill:  3    Follow-up: Return in about 6 months (around 03/14/2021), or restart Lisinopril for your blood pressure and kidneys..  Encouraged him to maintain his weight loss efforts.  Have switched him to Glucophage XL to help relieve symptoms.  Stressed the importance of timely follow-up in 6 months to check his progress.  Libby Maw, MD

## 2020-09-13 LAB — COMPREHENSIVE METABOLIC PANEL
ALT: 10 U/L (ref 0–53)
AST: 16 U/L (ref 0–37)
Albumin: 4.3 g/dL (ref 3.5–5.2)
Alkaline Phosphatase: 89 U/L (ref 39–117)
BUN: 15 mg/dL (ref 6–23)
CO2: 24 mEq/L (ref 19–32)
Calcium: 8.8 mg/dL (ref 8.4–10.5)
Chloride: 105 mEq/L (ref 96–112)
Creatinine, Ser: 1.09 mg/dL (ref 0.40–1.50)
GFR: 83.91 mL/min (ref >60.00)
Glucose, Bld: 89 mg/dL (ref 70–99)
Potassium: 3.9 mEq/L (ref 3.5–5.1)
Sodium: 137 mEq/L (ref 135–145)
Total Bilirubin: 0.7 mg/dL (ref 0.2–1.2)
Total Protein: 7.3 g/dL (ref 6.0–8.3)

## 2020-09-13 LAB — TSH: TSH: 0.83 u[IU]/mL (ref 0.35–4.50)

## 2020-09-13 LAB — LIPID PANEL
Cholesterol: 144 mg/dL (ref 0–200)
HDL: 31.5 mg/dL — ABNORMAL LOW (ref >39.00)
LDL Cholesterol: 103 mg/dL — ABNORMAL HIGH (ref 0–99)
NonHDL: 112.54
Total CHOL/HDL Ratio: 5
Triglycerides: 49 mg/dL (ref 0.0–149.0)
VLDL: 9.8 mg/dL (ref 0.0–40.0)

## 2020-09-13 LAB — CBC
HCT: 40.5 % (ref 39.0–52.0)
Hemoglobin: 12.9 g/dL — ABNORMAL LOW (ref 13.0–17.0)
MCHC: 31.9 g/dL (ref 30.0–36.0)
MCV: 64.8 fl — ABNORMAL LOW (ref 78.0–100.0)
Platelets: 220 10*3/uL (ref 150.0–400.0)
RBC: 6.24 Mil/uL — ABNORMAL HIGH (ref 4.22–5.81)
RDW: 16.8 % — ABNORMAL HIGH (ref 11.5–15.5)
WBC: 4.1 10*3/uL (ref 4.0–10.5)

## 2020-09-13 LAB — HEMOGLOBIN A1C: Hgb A1c MFr Bld: 6.9 % — ABNORMAL HIGH (ref 4.6–6.5)

## 2020-09-18 NOTE — Progress Notes (Signed)
Labs are looking quite promising. Hga1c improved but still in diabetic range. Hope that you can tolerate XL glucophage.  You are mildly anemic and it could be iron deficiency. Have you noticed any blood in the stool or urine? Dark tarry stools? Have ordered an iron panel. Please drop by to have it drawn.

## 2020-09-18 NOTE — Addendum Note (Signed)
Addended by: Jon Billings on: 09/18/2020 06:31 PM   Modules accepted: Orders

## 2020-09-26 ENCOUNTER — Other Ambulatory Visit: Payer: 59

## 2020-09-30 ENCOUNTER — Other Ambulatory Visit: Payer: Self-pay | Admitting: Family Medicine

## 2020-09-30 DIAGNOSIS — I10 Essential (primary) hypertension: Secondary | ICD-10-CM

## 2020-10-17 ENCOUNTER — Other Ambulatory Visit: Payer: 59

## 2020-10-17 ENCOUNTER — Other Ambulatory Visit: Payer: Self-pay

## 2020-10-17 ENCOUNTER — Other Ambulatory Visit (INDEPENDENT_AMBULATORY_CARE_PROVIDER_SITE_OTHER): Payer: 59

## 2020-10-17 ENCOUNTER — Encounter: Payer: Self-pay | Admitting: Family Medicine

## 2020-10-17 ENCOUNTER — Ambulatory Visit: Payer: 59 | Admitting: Family Medicine

## 2020-10-17 VITALS — BP 165/102 | HR 67 | Ht 73.5 in | Wt 271.0 lb

## 2020-10-17 DIAGNOSIS — D509 Iron deficiency anemia, unspecified: Secondary | ICD-10-CM | POA: Diagnosis not present

## 2020-10-17 DIAGNOSIS — G4733 Obstructive sleep apnea (adult) (pediatric): Secondary | ICD-10-CM

## 2020-10-17 NOTE — Patient Instructions (Signed)
Below is our plan:  We will discuss repeating sleep study with Dr Brett Fairy. I am concerned that sleep apnea could still be present based on last sleep study and no significant change in weight. Please continue working on healthy lifestyle habits. I recommend considering working with DME to find a mask that may be more comfortable. Sometimes it takes a little longer to become comfortable using CPAP. It is the best option in managing moderate sleep apnea. Inspire could be an option if you are interested but would require weight loss and additional workup to see if you would qualify.   Please make sure you are staying well hydrated. I recommend 50-60 ounces daily. Well balanced diet and regular exercise encouraged. Consistent sleep schedule with 6-8 hours recommended.   Please continue follow up with care team as directed.   Follow up with me pending sleep study results.   You may receive a survey regarding today's visit. I encourage you to leave honest feed back as I do use this information to improve patient care. Thank you for seeing me today!

## 2020-10-17 NOTE — Progress Notes (Signed)
Per orders of Dr.Kremer pt is here for Iron recheck pt tolerated draw well.

## 2020-10-17 NOTE — Progress Notes (Signed)
PATIENT: Darren Little DOB: 04-25-1980  REASON FOR VISIT: follow up HISTORY FROM: patient  Chief Complaint  Patient presents with   Follow-up    Pt alone, new rm. Pt is interested in repeating a SS. He states that he can not tolerate using the machine. Unable to sleep with it. He has not received a replacement machine. He has lost weight since SS     HISTORY OF PRESENT ILLNESS: 10/19/20 ALL:  Darren Little is a 40 y.o. male here today for follow up for OSA previously on on CPAP. Original OSA diagnosis was made in 2010. He was seen by Dr Dohmeier in 2020 with repeat HST 03/07/2019 that showed moderate OSA, AHI 23.6. he was restarted on CPAP with last HST. He has not been able to meet compliance recommendations. He feels that he fights with CPAP more than he rests. He wakes with headaches even when using CPAP consistently. He has not used CPAP since 05/2020. Last report shows 63% daily compliance from 04/12/2020-05/11/2020 with 53% four hour compliance. Residual AHI was 2.2. he wishes to repeat sleep study. He does not think he can tolerate CPAP. He failed DOT physical last month. He has been out of work for about three months. BP has been high. He reports CBG have been 85-100. Previous weight 02/2019 266, today 271. He reports weight was 155 at visit with DOT provider last week.   He has [employer]. He has not registered machine with [facility] and has not received replacement machine following recall.    HISTORY: (copied from Dr Dohmeier's previous note)  Darren Little is a 40 y.o. year old 79 or [place] American male patient seen here as a referral on 02/16/2019 from  for a DOT related sleep test.  Chief concern according to patient : " I have no symptoms of apnea, not being sleepy, no snoring"   I have the pleasure of seeing Darren Little today, a right-handed [place] or [place] American male with a possible sleep disorder.  He has a past medical history of  Abnormal  platelets ([place]) (09/26/12), Abnormal RBC (09/26/12), cardiovascular stress test, Hypertension, LVH (left ventricular hypertrophy), Microcytosis, Mild sleep apnea, Prediabetes, and Uncontrolled type 2 diabetes mellitus with microalbuminuria ([place]) (12/31/2018), obesity. He lost wright over the last 3 weeks,    The patient had the first sleep study in the year 2010 without a diagnosis of apnea.     Sleep relevant medical history: Nocturia - once.     Family medical /sleep history: No other family member on CPAP with OSA.   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 Family status is wife , with 2 sons.  The patient currently works as a Geophysicist/field seismologist, nights and days. Pets are not present. Tobacco use_ none .  ETOH use ; none . Caffeine intake in form of Coffee( none ) Soda( rare ) Tea ( none  or energy drinks.   Sleep habits are as follows: The patient's dinner time is between 6 PM. The patient goes to bed depending on the assignments of the day- and continues to sleep for several hours, wakes for one time  bathroom break,   The preferred sleep position is not reported, with the support of 1 pillow. Dreams are reportedly frequent.   He  reports  feeling refreshed / restored at 2  AM, without symptoms such as dry mouth, no morning headaches. He reports having  medication side effects on metformin. Naps are taken in frequently.   REVIEW OF SYSTEMS: Out of a complete 14 system review of symptoms, the patient complains only of the following symptoms, none and all other reviewed systems are negative.  ESS: 2 FSS: 9  ALLERGIES: Allergies  Allergen Reactions   Bee Venom Swelling   Other Other (See Comments)    Squash- hives    HOME MEDICATIONS: Outpatient Medications Prior to Visit  Medication Sig Dispense Refill   ACCU-CHEK GUIDE test strip USE 2 TWICE DAILY 200 each 0   amLODipine (NORVASC) 10 MG tablet Take 1 tablet by mouth once daily  90 tablet 1   blood glucose meter kit and supplies KIT Use to check FSBS BID. (Dx: E11.29, E11.65, R80.9). 1 each 0   EPINEPHrine (EPIPEN 2-PAK) 0.3 mg/0.3 mL IJ SOAJ injection Inject 0.3 mg into the muscle as needed for anaphylaxis. 2 each 0   lisinopril (ZESTRIL) 40 MG tablet Take 1 tablet (40 mg total) by mouth daily. 90 tablet 3   metFORMIN (GLUCOPHAGE XR) 500 MG 24 hr tablet Take one twice daily with meals. 180 tablet 3   Accu-Chek FastClix Lancets MISC USE 1 TO CHECK GLUCOSE TWICE DAILY     No facility-administered medications prior to visit.    PAST MEDICAL HISTORY: Past Medical History:  Diagnosis Date   Abnormal CT scan 08/2012   R axilla adenopathy   Abnormal platelets (HCC) 09/26/12   Large platelets   Abnormal RBC 09/26/12   Elliptocytes   Hx of cardiovascular stress test    ETT-Myoview 7/14:  Normal, EF 54%, no ischemia   Hypertension    LVH (left ventricular hypertrophy)    08/2712 echo-EF 55-60%, moderate LVH and mild LA dilatation.    Microcytosis    MCV 65   Mild sleep apnea    a. sleep study 9/14:  mild OSA, AHI 10/hour; O2 desat nadir 88% => recommend weight loss   Prediabetes    A1C 6.1% 08/2012   Uncontrolled type 2 diabetes mellitus with microalbuminuria ([place]) 12/31/2018    PAST SURGICAL HISTORY: Past Surgical History:  Procedure Laterality Date   HAND SURGERY Right 2000   Right hand, 5years ago    FAMILY HISTORY: Family History  Problem Relation Age of Onset   Heart disease Mother    Hypertension Mother    Hypertension Maternal Grandfather    Heart attack Neg Hx     SOCIAL HISTORY: Social History   Socioeconomic History   Marital status: Married    Spouse name: Not on file   Number of children: 2   Years of education: 12 grade   Highest education level: Not on file  Occupational History   Not on file  Tobacco Use   Smoking status: Never   Smokeless tobacco: Never  Vaping Use   Vaping Use: Never used  Substance and Sexual Activity    Alcohol use: Never   Drug use: Never   Sexual activity: Not on file  Other Topics Concern   Not on file  Social History Narrative   Not on file   Social Determinants of Health   Financial Resource Strain: Not on file  Food Insecurity: Not on file  Transportation Needs: Not on file  Physical Activity: Not on file  Stress: Not on file  Social Connections: Not on file  Intimate Partner Violence: Not on file     PHYSICAL EXAM  Vitals:   10/17/20 1354  BP: (!) 165/102  Pulse:  67  Weight: 271 lb (122.9 kg)  Height: 6' 1.5" (1.867 m)   Body mass index is 35.27 kg/m.  Generalized: Well developed, in no acute distress  Cardiology: normal rate and rhythm, no murmur noted Respiratory: clear to auscultation bilaterally  Neurological examination  Mentation: Alert oriented to time, place, history taking. Follows all commands speech and language fluent Cranial nerve II-XII: Pupils were equal round reactive to light. Extraocular movements were full, visual field were full  Motor: The motor testing reveals 5 over 5 strength of all 4 extremities. Good symmetric motor tone is noted throughout.  Gait and station: Gait is normal.    DIAGNOSTIC DATA (LABS, IMAGING, TESTING) - I reviewed patient records, labs, notes, testing and imaging myself where available.  No flowsheet data found.   Lab Results  Component Value Date   WBC 4.1 09/12/2020   HGB 12.9 (L) 09/12/2020   HCT 40.5 09/12/2020   MCV 64.8 Repeated and verified X2. (L) 09/12/2020   PLT 220.0 09/12/2020      Component Value Date/Time   NA 137 09/12/2020 1500   NA 136 12/30/2018 1604   K 3.9 09/12/2020 1500   CL 105 09/12/2020 1500   CO2 24 09/12/2020 1500   GLUCOSE 89 09/12/2020 1500   BUN 15 09/12/2020 1500   BUN 14 12/30/2018 1604   CREATININE 1.09 09/12/2020 1500   CREATININE 1.10 09/06/2015 1229   CALCIUM 8.8 09/12/2020 1500   PROT 7.3 09/12/2020 1500   PROT 7.0 12/30/2018 1604   ALBUMIN 4.3 09/12/2020 1500    ALBUMIN 4.1 12/30/2018 1604   AST 16 09/12/2020 1500   ALT 10 09/12/2020 1500   ALKPHOS 89 09/12/2020 1500   BILITOT 0.7 09/12/2020 1500   BILITOT <0.2 12/30/2018 1604   GFRNONAA 78 12/30/2018 1604   GFRAA 91 12/30/2018 1604   Lab Results  Component Value Date   CHOL 144 09/12/2020   HDL 31.50 (L) 09/12/2020   LDLCALC 103 (H) 09/12/2020   LDLDIRECT 77.0 01/25/2020   TRIG 49.0 09/12/2020   CHOLHDL 5 09/12/2020   Lab Results  Component Value Date   HGBA1C 6.9 (H) 09/12/2020   Lab Results  Component Value Date   HQIONGEX52 841 01/25/2020   Lab Results  Component Value Date   TSH 0.83 09/12/2020     ASSESSMENT AND PLAN 40 y.o. year old male  has a past medical history of Abnormal CT scan (08/2012), Abnormal platelets ([place]) (09/26/12), Abnormal RBC (09/26/12), cardiovascular stress test, Hypertension, LVH (left ventricular hypertrophy), Microcytosis, Mild sleep apnea, Prediabetes, and Uncontrolled type 2 diabetes mellitus with microalbuminuria ([place]) (12/31/2018). here with     ICD-10-CM   1. OSA (obstructive sleep apnea)  G47.33         ZOEY GILKESON has not been able to tolerate CPAP therapy. He reports weight loss of about 25-30 pounds since last being seen, however, it is not verifiable by our records. He feels that he sleeps much better without using CPAP. He is not interested and would not qualify at this time for [facility] device. He is requesting to repeat sleep study based on lifestyle changes and weight loss. He will consider resuming CPAP if if needed pending results. Risks of untreated sleep apnea review and education materials provided. Healthy lifestyle habits encouraged. He will follow up pending sleep evaluation. He verbalizes understanding and agreement with this plan.    No orders of the defined types were placed in this encounter.    No orders of the  defined types were placed in this encounter.     Debbora Presto, FNP-C 10/19/2020, 4:38 PM [facility] [address] [place], [place] [address] [phone]

## 2020-10-18 ENCOUNTER — Other Ambulatory Visit: Payer: 59

## 2020-10-18 LAB — IRON,TIBC AND FERRITIN PANEL
%SAT: 15 % (calc) — ABNORMAL LOW (ref 20–48)
Ferritin: 151 ng/mL (ref 38–380)
Iron: 46 ug/dL — ABNORMAL LOW (ref 50–180)
TIBC: 315 mcg/dL (calc) (ref 250–425)

## 2020-10-18 MED ORDER — IRON (FERROUS SULFATE) 325 (65 FE) MG PO TABS
ORAL_TABLET | ORAL | 2 refills | Status: DC
Start: 2020-10-18 — End: 2021-01-09

## 2020-10-18 NOTE — Addendum Note (Signed)
Addended by: Jon Billings on: 10/18/2020 12:41 PM   Modules accepted: Orders

## 2020-10-18 NOTE — Progress Notes (Signed)
Iron levels are low again.  Have sent iron tablet rx to pharmacy for you to take twice daily?  Please return to the clinic to pick up hemoccult cards for you to check 3 different stools on 3 different days. Please bring these back to the clinic as soon as your done. This will check for blood in your stool.   Please return for follow up in 3 months instead of 6.

## 2020-10-19 ENCOUNTER — Encounter: Payer: Self-pay | Admitting: Family Medicine

## 2020-10-19 ENCOUNTER — Other Ambulatory Visit: Payer: Self-pay | Admitting: Neurology

## 2020-10-19 DIAGNOSIS — G4733 Obstructive sleep apnea (adult) (pediatric): Secondary | ICD-10-CM

## 2020-10-19 DIAGNOSIS — I10 Essential (primary) hypertension: Secondary | ICD-10-CM

## 2020-10-31 ENCOUNTER — Ambulatory Visit: Payer: Self-pay | Admitting: Adult Health

## 2020-10-31 ENCOUNTER — Ambulatory Visit
Admission: EM | Admit: 2020-10-31 | Discharge: 2020-10-31 | Disposition: A | Discharge status: Home / Self Care | Payer: 59 | Attending: Urgent Care | Admitting: Urgent Care

## 2020-10-31 ENCOUNTER — Other Ambulatory Visit: Payer: Self-pay

## 2020-10-31 DIAGNOSIS — E119 Type 2 diabetes mellitus without complications: Secondary | ICD-10-CM

## 2020-10-31 DIAGNOSIS — U071 COVID-19: Secondary | ICD-10-CM | POA: Diagnosis not present

## 2020-10-31 DIAGNOSIS — I10 Essential (primary) hypertension: Secondary | ICD-10-CM

## 2020-10-31 DIAGNOSIS — R52 Pain, unspecified: Secondary | ICD-10-CM

## 2020-10-31 MED ORDER — NIRMATRELVIR & RITONAVIR 10 X 150 MG & 10 X 100MG PO TBPK: ORAL_TABLET | ORAL | 0 refills | Status: DC

## 2020-10-31 NOTE — ED Triage Notes (Signed)
Yesterday, Pt tested positive for covid with an at home test. Notes onset yesterday of cough, congestion, body aches, HA and sore throat. Confirms nausea and dysphagia. Denies abdominal pain, v/d/r. Pt complains of blood in his mucous and numbness in his tongue. Has been taking tylenol and aleve with some relief.

## 2020-10-31 NOTE — ED Provider Notes (Signed)
[place]   MRN: [id number] DOB: Jan 06, 1981  Subjective:   Darren Little is a 40 y.o. male presenting for 2 day history of acute onset body aches, malaise and fatigue, sinus congestion, coughing.  Has also had some throat pain and nausea.  Denies chest pain, shortness of breath, wheezing, fevers.  It took a COVID-19 test yesterday when he felt the worst and was positive.  He is not COVID vaccinated.  Patient has a history of diabetes treated without insulin, essential hypertension, LVH.  He is not on any antiarrhythmic medications.  No smoking or alcohol use.  No drug use.  No current facility-administered medications for this encounter.  Current Outpatient Medications:    ACCU-CHEK GUIDE test strip, USE 2 TWICE DAILY, Disp: 200 each, Rfl: 0   amLODipine (NORVASC) 10 MG tablet, Take 1 tablet by mouth once daily, Disp: 90 tablet, Rfl: 1   blood glucose meter kit and supplies KIT, Use to check FSBS BID. (Dx: E11.29, E11.65, R80.9)., Disp: 1 each, Rfl: 0   EPINEPHrine (EPIPEN 2-PAK) 0.3 mg/0.3 mL IJ SOAJ injection, Inject 0.3 mg into the muscle as needed for anaphylaxis., Disp: 2 each, Rfl: 0   Iron, Ferrous Sulfate, 325 (65 Fe) MG TABS, Take one twice daily., Disp: 60 tablet, Rfl: 2   lisinopril (ZESTRIL) 40 MG tablet, Take 1 tablet (40 mg total) by mouth daily., Disp: 90 tablet, Rfl: 3   metFORMIN (GLUCOPHAGE XR) 500 MG 24 hr tablet, Take one twice daily with meals., Disp: 180 tablet, Rfl: 3    Allergies  Allergen Reactions   Bee Venom Swelling   Other Other (See Comments)    Squash- hives    Past Medical History:  Diagnosis Date   Abnormal CT scan 08/2012   R axilla adenopathy   Abnormal platelets (HCC) 09/26/12   Large platelets   Abnormal RBC 09/26/12   Elliptocytes   Hx of cardiovascular stress test    ETT-Myoview 7/14:  Normal, EF 54%, no ischemia   Hypertension    LVH (left ventricular hypertrophy)    08/2712 echo-EF 55-60%, moderate LVH and mild LA  dilatation.    Microcytosis    MCV 65   Mild sleep apnea    a. sleep study 9/14:  mild OSA, AHI 10/hour; O2 desat nadir 88% => recommend weight loss   Prediabetes    A1C 6.1% 08/2012   Uncontrolled type 2 diabetes mellitus with microalbuminuria ([place]) 12/31/2018     Past Surgical History:  Procedure Laterality Date   HAND SURGERY Right 2000   Right hand, 5years ago    Family History  Problem Relation Age of Onset   Heart disease Mother    Hypertension Mother    Hypertension Maternal Grandfather    Heart attack Neg Hx     Social History   Tobacco Use   Smoking status: Never   Smokeless tobacco: Never  Vaping Use   Vaping Use: Never used  Substance Use Topics   Alcohol use: Never   Drug use: Never    ROS   Objective:   Vitals: BP (!) 145/91 (BP Location: Left Arm)   Pulse 82   Temp 98.8 F (37.1 C) (Oral)   Resp 18   SpO2 95%   Physical Exam Constitutional:      General: He is not in acute distress.    Appearance: Normal appearance. He is well-developed. He is not ill-appearing, toxic-appearing or diaphoretic.  HENT:     Head: Normocephalic and atraumatic.  Right Ear: External ear normal.     Left Ear: External ear normal.     Nose: Nose normal.     Mouth/Throat:     Mouth: Mucous membranes are moist.     Pharynx: Oropharynx is clear.  Eyes:     General: No scleral icterus.    Extraocular Movements: Extraocular movements intact.     Pupils: Pupils are equal, round, and reactive to light.  Cardiovascular:     Rate and Rhythm: Normal rate and regular rhythm.     Heart sounds: Normal heart sounds. No murmur heard.   No friction rub. No gallop.  Pulmonary:     Effort: Pulmonary effort is normal. No respiratory distress.     Breath sounds: Normal breath sounds. No stridor. No wheezing, rhonchi or rales.  Neurological:     Mental Status: He is alert and oriented to person, place, and time.  Psychiatric:        Mood and Affect: Mood normal.         Behavior: Behavior normal.        Thought Content: Thought content normal.   Recent Results (from the past 2160 hour(s))  CBC     Status: Abnormal   Collection Time: 09/12/20  3:00 PM  Result Value Ref Range   WBC 4.1 4.0 - 10.5 K/uL   RBC 6.24 (H) 4.22 - 5.81 Mil/uL   Platelets 220.0 150.0 - 400.0 K/uL   Hemoglobin 12.9 (L) 13.0 - 17.0 g/dL   HCT 40.5 39.0 - 52.0 %   MCV 64.8 Repeated and verified X2. (L) 78.0 - 100.0 fl   MCHC 31.9 30.0 - 36.0 g/dL   RDW 16.8 (H) 11.5 - 15.5 %  Comprehensive metabolic panel     Status: None   Collection Time: 09/12/20  3:00 PM  Result Value Ref Range   Sodium 137 135 - 145 mEq/L   Potassium 3.9 3.5 - 5.1 mEq/L   Chloride 105 96 - 112 mEq/L   CO2 24 19 - 32 mEq/L   Glucose, Bld 89 70 - 99 mg/dL   BUN 15 6 - 23 mg/dL   Creatinine, Ser 1.09 0.40 - 1.50 mg/dL   Total Bilirubin 0.7 0.2 - 1.2 mg/dL   Alkaline Phosphatase 89 39 - 117 U/L   AST 16 0 - 37 U/L   ALT 10 0 - 53 U/L   Total Protein 7.3 6.0 - 8.3 g/dL   Albumin 4.3 3.5 - 5.2 g/dL   GFR 83.91 >60.00 mL/min    Comment: Calculated using the CKD-EPI Creatinine Equation (2021)   Calcium 8.8 8.4 - 10.5 mg/dL  Hemoglobin A1c     Status: Abnormal   Collection Time: 09/12/20  3:00 PM  Result Value Ref Range   Hgb A1c MFr Bld 6.9 (H) 4.6 - 6.5 %    Comment: Glycemic Control Guidelines for People with Diabetes:Non Diabetic:  <6%Goal of Therapy: <7%Additional Action Suggested:  >8%   Lipid panel     Status: Abnormal   Collection Time: 09/12/20  3:00 PM  Result Value Ref Range   Cholesterol 144 0 - 200 mg/dL    Comment: ATP III Classification       Desirable:  < 200 mg/dL               Borderline High:  200 - 239 mg/dL          High:  > = 240 mg/dL   Triglycerides 49.0 0.0 - 149.0 mg/dL  Comment: Normal:  <150 mg/dLBorderline High:  150 - 199 mg/dL   HDL 31.50 (L) >39.00 mg/dL   VLDL 9.8 0.0 - 40.0 mg/dL   LDL Cholesterol 103 (H) 0 - 99 mg/dL   Total CHOL/HDL Ratio 5     Comment:                 Men          Women1/2 Average Risk     3.4          3.3Average Risk          5.0          4.42X Average Risk          9.6          7.13X Average Risk          15.0          11.0                       NonHDL 112.54     Comment: NOTE:  Non-HDL goal should be 30 mg/dL higher than patient's LDL goal (i.e. LDL goal of < 70 mg/dL, would have non-HDL goal of < 100 mg/dL)  Urinalysis, Routine w reflex microscopic     Status: Abnormal   Collection Time: 09/12/20  3:00 PM  Result Value Ref Range   Color, Urine YELLOW Yellow;Lt. Yellow;Straw;Dark Yellow;Amber;Green;Red;Brown   APPearance CLEAR Clear;Turbid;Slightly Cloudy;Cloudy   Specific Gravity, Urine 1.025 1.000 - 1.030   pH 6.0 5.0 - 8.0   Total Protein, Urine 30 (A) Negative   Urine Glucose NEGATIVE Negative   Ketones, ur NEGATIVE Negative   Bilirubin Urine NEGATIVE Negative   Hgb urine dipstick NEGATIVE Negative   Urobilinogen, UA 0.2 0.0 - 1.0   Leukocytes,Ua NEGATIVE Negative   Nitrite NEGATIVE Negative   WBC, UA 0-2/hpf 0-2/hpf   RBC / HPF none seen 0-2/hpf   Squamous Epithelial / LPF Rare(0-4/hpf) Rare(0-4/hpf)  TSH     Status: None   Collection Time: 09/12/20  3:00 PM  Result Value Ref Range   TSH 0.83 0.35 - 4.50 uIU/mL  Iron, TIBC and Ferritin Panel     Status: Abnormal   Collection Time: 10/17/20  3:58 PM  Result Value Ref Range   Iron 46 (L) 50 - 180 mcg/dL   TIBC 315 250 - 425 mcg/dL (calc)   %SAT 15 (L) 20 - 48 % (calc)   Ferritin 151 38 - 380 ng/mL     Assessment and Plan :   PDMP not reviewed this encounter.  1. COVID-19   2. Type 2 diabetes mellitus treated without insulin ([place])   3. Essential hypertension   4. Body aches     Patient is an excellent candidate for Paxlovid.  Prescription sent to his pharmacy for this.  GFR is within acceptable limits.  Recommend supportive care otherwise.  Confirmation COVID-19 test pending.  Counseled patient on potential for adverse effects with medications  prescribed/recommended today, ER and return-to-clinic precautions discussed, patient verbalized understanding.    Jaynee Eagles, [place] 10/31/20 [phone]

## 2020-11-01 LAB — NOVEL CORONAVIRUS, NAA: SARS-CoV-2, NAA: DETECTED — AB

## 2020-11-01 LAB — SARS-COV-2, NAA 2 DAY TAT

## 2020-11-14 LAB — HM DIABETES EYE EXAM

## 2021-01-09 ENCOUNTER — Encounter: Payer: Self-pay | Admitting: Family Medicine

## 2021-01-09 ENCOUNTER — Other Ambulatory Visit: Payer: Self-pay

## 2021-01-09 ENCOUNTER — Ambulatory Visit (INDEPENDENT_AMBULATORY_CARE_PROVIDER_SITE_OTHER): Payer: 59 | Admitting: Family Medicine

## 2021-01-09 VITALS — BP 162/104 | HR 62 | Temp 97.0°F | Ht 73.0 in | Wt 269.0 lb

## 2021-01-09 DIAGNOSIS — R718 Other abnormality of red blood cells: Secondary | ICD-10-CM | POA: Diagnosis not present

## 2021-01-09 DIAGNOSIS — I10 Essential (primary) hypertension: Secondary | ICD-10-CM | POA: Diagnosis not present

## 2021-01-09 DIAGNOSIS — E611 Iron deficiency: Secondary | ICD-10-CM

## 2021-01-09 DIAGNOSIS — Z23 Encounter for immunization: Secondary | ICD-10-CM

## 2021-01-09 DIAGNOSIS — E785 Hyperlipidemia, unspecified: Secondary | ICD-10-CM | POA: Diagnosis not present

## 2021-01-09 DIAGNOSIS — M25512 Pain in left shoulder: Secondary | ICD-10-CM

## 2021-01-09 DIAGNOSIS — E1169 Type 2 diabetes mellitus with other specified complication: Secondary | ICD-10-CM

## 2021-01-09 DIAGNOSIS — D509 Iron deficiency anemia, unspecified: Secondary | ICD-10-CM

## 2021-01-09 DIAGNOSIS — E119 Type 2 diabetes mellitus without complications: Secondary | ICD-10-CM

## 2021-01-09 HISTORY — DX: Pain in left shoulder: M25.512

## 2021-01-09 HISTORY — DX: Encounter for immunization: Z23

## 2021-01-09 HISTORY — DX: Iron deficiency: E61.1

## 2021-01-09 LAB — URINALYSIS, ROUTINE W REFLEX MICROSCOPIC
Bilirubin Urine: NEGATIVE
Hgb urine dipstick: NEGATIVE
Ketones, ur: NEGATIVE
Leukocytes,Ua: NEGATIVE
Nitrite: NEGATIVE
RBC / HPF: NONE SEEN (ref 0–?)
Specific Gravity, Urine: 1.025 (ref 1.000–1.030)
Total Protein, Urine: 30 — AB
Urine Glucose: NEGATIVE
Urobilinogen, UA: 0.2 (ref 0.0–1.0)
pH: 6 (ref 5.0–8.0)

## 2021-01-09 LAB — COMPREHENSIVE METABOLIC PANEL
ALT: 10 U/L (ref 0–53)
AST: 20 U/L (ref 0–37)
Albumin: 4.3 g/dL (ref 3.5–5.2)
Alkaline Phosphatase: 85 U/L (ref 39–117)
BUN: 16 mg/dL (ref 6–23)
CO2: 24 mEq/L (ref 19–32)
Calcium: 9 mg/dL (ref 8.4–10.5)
Chloride: 105 mEq/L (ref 96–112)
Creatinine, Ser: 1.14 mg/dL (ref 0.40–1.50)
GFR: 79.34 mL/min (ref >60.00)
Glucose, Bld: 93 mg/dL (ref 70–99)
Potassium: 4 mEq/L (ref 3.5–5.1)
Sodium: 137 mEq/L (ref 135–145)
Total Bilirubin: 0.7 mg/dL (ref 0.2–1.2)
Total Protein: 7.4 g/dL (ref 6.0–8.3)

## 2021-01-09 LAB — HEMOGLOBIN A1C: Hgb A1c MFr Bld: 6.8 % — ABNORMAL HIGH (ref 4.6–6.5)

## 2021-01-09 MED ORDER — METFORMIN HCL ER 500 MG PO TB24: ORAL_TABLET | ORAL | 3 refills | Status: DC

## 2021-01-09 MED ORDER — ACCU-CHEK GUIDE VI STRP: ORAL_STRIP | 0 refills | Status: DC

## 2021-01-09 MED ORDER — LISINOPRIL 40 MG PO TABS: 40.0000 mg | ORAL_TABLET | Freq: Every day | ORAL | 3 refills | Status: DC

## 2021-01-09 MED ORDER — AMLODIPINE BESYLATE 10 MG PO TABS: 10.0000 mg | ORAL_TABLET | Freq: Every day | ORAL | 1 refills | Status: DC

## 2021-01-09 MED ORDER — IRON (FERROUS SULFATE) 325 (65 FE) MG PO TABS: 325.0000 mg | ORAL_TABLET | Freq: Every day | ORAL | 3 refills | Status: DC

## 2021-01-09 NOTE — Progress Notes (Signed)
Established Patient Office Visit  Subjective:  Patient ID: Darren Little, male    DOB: 1980-12-22  Age: 40 y.o. MRN: [id number]  CC:  Chief Complaint  Patient presents with   Follow-up    3 month follow up on BP, no concerns.     HPI Darren Little presents for follow-up of hypertension, hyperlipidemia, and diabetes.  Has been out of amlodipine and lisinopril.  Has been out of Glucophage.  He is fasting today.  Says that he did have a diabetic eye check back in July or August that was normal.  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  Complains of  left shoulder pain.  Past Medical History:  Diagnosis Date   Abnormal CT scan 08/2012   R axilla adenopathy   Abnormal platelets (HCC) 09/26/12   Large platelets   Abnormal RBC 09/26/12   Elliptocytes   Hx of cardiovascular stress test    ETT-Myoview 7/14:  Normal, EF 54%, no ischemia   Hypertension    LVH (left ventricular hypertrophy)    08/2712 echo-EF 55-60%, moderate LVH and mild LA dilatation.    Microcytosis    MCV 65   Mild sleep apnea    a. 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    Past Surgical History:  Procedure Laterality Date   HAND SURGERY Right 2000   Right hand, 5years ago    Family History  Problem Relation Age of Onset   Heart disease Mother    Hypertension Mother    Hypertension Maternal Grandfather    Heart attack Neg Hx     Social History   Socioeconomic History   Marital status: Married    Spouse name: Not on file   Number of children: 2   Years of education: 12 grade   Highest education level: Not on file  Occupational History   Not on file  Tobacco Use   Smoking status: Never   Smokeless tobacco: Never  Vaping Use   Vaping Use: Never used  Substance and Sexual Activity   Alcohol use: Never   Drug  use: Never   Sexual activity: Not on file  Other Topics Concern   Not on file  Social History Narrative   Not on file   Social Determinants of Health   Financial Resource Strain: Not on file  Food Insecurity: Not on file  Transportation Needs: Not on file  Physical Activity: Not on file  Stress: Not on file  Social Connections: Not on file  Intimate Partner Violence: Not on file    Outpatient Medications Prior to Visit  Medication Sig Dispense Refill   blood glucose meter kit and supplies KIT Use to check FSBS BID. (Dx: E11.29, E11.65, R80.9). 1 each 0   EPINEPHrine (EPIPEN 2-PAK) 0.3 mg/0.3 mL IJ SOAJ injection Inject 0.3 mg into the muscle as needed for anaphylaxis. 2 each 0   nirmatrelvir/ritonavir EUA, renal dosing, (PAXLOVID) TBPK Take nirmatrelvir (150 mg) 2 tablets twice daily for 5 days and ritonavir (100 mg) one tablet twice daily for 5 days. 20 tablet 0   ACCU-CHEK GUIDE test strip USE 2 TWICE DAILY 200 each 0   amLODipine (NORVASC) 10 MG tablet Take 1 tablet by mouth once daily 90 tablet 1   lisinopril (ZESTRIL) 40  MG tablet Take 1 tablet (40 mg total) by mouth daily. 90 tablet 3   metFORMIN (GLUCOPHAGE XR) 500 MG 24 hr tablet Take one twice daily with meals. 180 tablet 3   Iron, Ferrous Sulfate, 325 (65 Fe) MG TABS Take one twice daily. (Patient not taking: Reported on 01/09/2021) 60 tablet 2   No facility-administered medications prior to visit.    Allergies  Allergen Reactions   Bee Venom Swelling   Other Other (See Comments)    Squash- hives    ROS Review of Systems  Constitutional: Negative.   HENT: Negative.    Eyes:  Negative for photophobia and visual disturbance.  Respiratory: Negative.    Cardiovascular: Negative.   Gastrointestinal: Negative.   Endocrine: Negative for polyphagia and polyuria.  Genitourinary: Negative.   Musculoskeletal:  Positive for arthralgias.  Neurological:  Negative for speech difficulty and weakness.   Psychiatric/Behavioral: Negative.       Objective:    Physical Exam Vitals and nursing note reviewed.  Constitutional:      General: He is not in acute distress.    Appearance: Normal appearance. He is not ill-appearing, toxic-appearing or diaphoretic.  HENT:     Head: Normocephalic and atraumatic.     Right Ear: External ear normal.     Left Ear: External ear normal.     Mouth/Throat:     Mouth: Mucous membranes are moist.     Pharynx: Oropharynx is clear. No oropharyngeal exudate or posterior oropharyngeal erythema.  Eyes:     General:        Right eye: No discharge.        Left eye: No discharge.     Extraocular Movements: Extraocular movements intact.     Conjunctiva/sclera: Conjunctivae normal.     Pupils: Pupils are equal, round, and reactive to light.  Cardiovascular:     Rate and Rhythm: Normal rate and regular rhythm.  Pulmonary:     Effort: Pulmonary effort is normal.     Breath sounds: Normal breath sounds.  Abdominal:     General: Bowel sounds are normal.  Musculoskeletal:     Left shoulder: Tenderness and bony tenderness present. Normal range of motion.     Cervical back: No rigidity or tenderness.  Lymphadenopathy:     Cervical: No cervical adenopathy.  Skin:    General: Skin is warm and dry.  Neurological:     Mental Status: He is alert.  Psychiatric:        Mood and Affect: Mood normal.        Behavior: Behavior normal.    BP (!) 162/104 (BP Location: Right Arm, Patient Position: Sitting, Cuff Size: Large)   Pulse 62   Temp (!) 97 F (36.1 C) (Temporal)   Ht _0  (1.854 m)   Wt 269 lb (122 kg)   SpO2 97%   BMI 35.49 kg/m  Wt Readings from Last 3 Encounters:  01/09/21 269 lb (122 kg)  10/17/20 271 lb (122.9 kg)  09/12/20 264 lb 9.6 oz (120 kg)     Health Maintenance Due  Topic Date Due   OPHTHALMOLOGY EXAM  Never done   HIV Screening  Never done   Hepatitis C Screening  Never done   FOOT EXAM  05/17/2020    There are no preventive  care reminders to display for this patient.  Lab Results  Component Value Date   TSH 0.83 09/12/2020   Lab Results  Component Value Date   WBC 4.1 09/12/2020  HGB 12.9 (L) 09/12/2020   HCT 40.5 09/12/2020   MCV 64.8 Repeated and verified X2. (L) 09/12/2020   PLT 220.0 09/12/2020   Lab Results  Component Value Date   NA 137 09/12/2020   K 3.9 09/12/2020   CO2 24 09/12/2020   GLUCOSE 89 09/12/2020   BUN 15 09/12/2020   CREATININE 1.09 09/12/2020   BILITOT 0.7 09/12/2020   ALKPHOS 89 09/12/2020   AST 16 09/12/2020   ALT 10 09/12/2020   PROT 7.3 09/12/2020   ALBUMIN 4.3 09/12/2020   CALCIUM 8.8 09/12/2020   ANIONGAP 10 05/25/2015   GFR 83.91 09/12/2020   Lab Results  Component Value Date   CHOL 144 09/12/2020   Lab Results  Component Value Date   HDL 31.50 (L) 09/12/2020   Lab Results  Component Value Date   LDLCALC 103 (H) 09/12/2020   Lab Results  Component Value Date   TRIG 49.0 09/12/2020   Lab Results  Component Value Date   CHOLHDL 5 09/12/2020   Lab Results  Component Value Date   HGBA1C 6.9 (H) 09/12/2020      Assessment & Plan:   Problem List Items Addressed This Visit       Cardiovascular and Mediastinum   Essential hypertension   Relevant Medications   amLODipine (NORVASC) 10 MG tablet   lisinopril (ZESTRIL) 40 MG tablet   Other Relevant Orders   CBC   Comprehensive metabolic panel   Urinalysis, Routine w reflex microscopic     Endocrine   Hyperlipidemia associated with type 2 diabetes mellitus (HCC)   Relevant Medications   amLODipine (NORVASC) 10 MG tablet   lisinopril (ZESTRIL) 40 MG tablet   metFORMIN (GLUCOPHAGE XR) 500 MG 24 hr tablet   Other Relevant Orders   Comprehensive metabolic panel   Type 2 diabetes mellitus without complication, without long-term current use of insulin (HCC)   Relevant Medications   ACCU-CHEK GUIDE test strip   lisinopril (ZESTRIL) 40 MG tablet   metFORMIN (GLUCOPHAGE XR) 500 MG 24 hr tablet    Other Relevant Orders   Comprehensive metabolic panel   Hemoglobin A1c   Urinalysis, Routine w reflex microscopic     Other   Microcytic anemia   Relevant Medications   Iron, Ferrous Sulfate, 325 (65 Fe) MG TABS   Other Relevant Orders   CBC   Iron, TIBC and Ferritin Panel   Urinalysis, Routine w reflex microscopic   POC Hemoccult Bld/Stl (3-Cd Home Screen)   Left shoulder pain   Relevant Orders   Ambulatory referral to Sports Medicine   Iron deficiency   Relevant Medications   Iron, Ferrous Sulfate, 325 (65 Fe) MG TABS   Need for influenza vaccination - Primary   Relevant Orders   Flu Vaccine QUAD 6+ mos PF IM (Fluarix Quad PF) (Completed)    Meds ordered this encounter  Medications   ACCU-CHEK GUIDE test strip    Sig: USE 2 TWICE DAILY    Dispense:  200 each    Refill:  0   amLODipine (NORVASC) 10 MG tablet    Sig: Take 1 tablet (10 mg total) by mouth daily.    Dispense:  90 tablet    Refill:  1   lisinopril (ZESTRIL) 40 MG tablet    Sig: Take 1 tablet (40 mg total) by mouth daily.    Dispense:  90 tablet    Refill:  3   metFORMIN (GLUCOPHAGE XR) 500 MG 24 hr tablet    Sig:  Take one twice daily with meals.    Dispense:  180 tablet    Refill:  3   Iron, Ferrous Sulfate, 325 (65 Fe) MG TABS    Sig: Take 325 mg by mouth daily.    Dispense:  60 tablet    Refill:  3    Follow-up: Return in about 3 months (around 04/11/2021).  Continue current medications.  Stressed the importance of staying on his medicines and following up prior to running out of them.  Libby Maw, MD

## 2021-01-10 LAB — IRON,TIBC AND FERRITIN PANEL
%SAT: 19 % (calc) — ABNORMAL LOW (ref 20–48)
Ferritin: 186 ng/mL (ref 38–380)
Iron: 65 ug/dL (ref 50–180)
TIBC: 346 mcg/dL (calc) (ref 250–425)

## 2021-01-10 LAB — CBC
HCT: 39.8 % (ref 39.0–52.0)
Hemoglobin: 12.7 g/dL — ABNORMAL LOW (ref 13.0–17.0)
MCHC: 32 g/dL (ref 30.0–36.0)
MCV: 64.2 fl — ABNORMAL LOW (ref 78.0–100.0)
Platelets: 222 10*3/uL (ref 150.0–400.0)
RBC: 6.21 Mil/uL — ABNORMAL HIGH (ref 4.22–5.81)
RDW: 17.8 % — ABNORMAL HIGH (ref 11.5–15.5)
WBC: 3.7 10*3/uL — ABNORMAL LOW (ref 4.0–10.5)

## 2021-01-16 ENCOUNTER — Telehealth: Payer: Self-pay | Admitting: Family Medicine

## 2021-01-16 NOTE — Telephone Encounter (Signed)
Returned patients call went over labs.

## 2021-02-17 ENCOUNTER — Encounter: Payer: Self-pay | Admitting: Family Medicine

## 2021-02-20 ENCOUNTER — Ambulatory Visit: Payer: 59 | Admitting: Family Medicine

## 2021-03-16 ENCOUNTER — Encounter: Payer: Self-pay | Admitting: Family Medicine

## 2021-03-20 ENCOUNTER — Ambulatory Visit: Payer: 59 | Admitting: Family Medicine

## 2021-04-17 ENCOUNTER — Other Ambulatory Visit: Payer: Self-pay

## 2021-04-17 ENCOUNTER — Encounter: Payer: Self-pay | Admitting: Family Medicine

## 2021-04-17 ENCOUNTER — Ambulatory Visit (INDEPENDENT_AMBULATORY_CARE_PROVIDER_SITE_OTHER): Payer: 59 | Admitting: Family Medicine

## 2021-04-17 VITALS — BP 146/94 | HR 73 | Temp 97.3°F | Ht 73.0 in | Wt 274.8 lb

## 2021-04-17 DIAGNOSIS — E119 Type 2 diabetes mellitus without complications: Secondary | ICD-10-CM

## 2021-04-17 DIAGNOSIS — D560 Alpha thalassemia: Secondary | ICD-10-CM

## 2021-04-17 DIAGNOSIS — R69 Illness, unspecified: Secondary | ICD-10-CM | POA: Diagnosis not present

## 2021-04-17 DIAGNOSIS — D509 Iron deficiency anemia, unspecified: Secondary | ICD-10-CM | POA: Diagnosis not present

## 2021-04-17 DIAGNOSIS — Z114 Encounter for screening for human immunodeficiency virus [HIV]: Secondary | ICD-10-CM | POA: Diagnosis not present

## 2021-04-17 DIAGNOSIS — I10 Essential (primary) hypertension: Secondary | ICD-10-CM

## 2021-04-17 DIAGNOSIS — Z1159 Encounter for screening for other viral diseases: Secondary | ICD-10-CM

## 2021-04-17 NOTE — Progress Notes (Addendum)
° °Established Patient Office Visit ° °Subjective:  °Patient ID: Darren Little, male    DOB: 02/24/1981  Age: 41 y.o. MRN: [id number] ° °CC:  °Chief Complaint  °Patient presents with  ° Follow-up  °  3 month follow up on DM, no concerns.   ° ° °HPI °Darren Little presents for follow-up hypertension, diabetes and microcytic anemia.  Iron and B12 levels are normal.   Lost given hemoccult cards.  Blood pressure at home in the 120s over 70s.  Fasting sugars have been in the 130s range.  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  Continues with lisinopril, amlodipine and metformin twice daily.  He is having no issues taking these medications.  He has seen no blood or melena ° °Past Medical History:  °Diagnosis Date  ° Abnormal CT scan 08/2012  ° R axilla adenopathy  ° Abnormal platelets (HCC) 09/26/12  ° Large platelets  ° Abnormal RBC 09/26/12  ° Elliptocytes  ° Hx of cardiovascular stress test   ° ETT-Myoview 7/14:  Normal, EF 54%, no ischemia  ° Hypertension   ° LVH (left ventricular hypertrophy)   ° 08/2712 echo-EF 55-60%, moderate LVH and mild LA dilatation.   ° Microcytosis   ° MCV 65  ° Mild sleep apnea   ° a. 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/31/2018  ° ° °Past Surgical History:  °Procedure Laterality Date  ° HAND SURGERY Right 2000  ° Right hand, 5years ago  ° ° °Family History  °Problem Relation Age of Onset  ° Heart disease Mother   ° Hypertension Mother   ° Hypertension Maternal Grandfather   ° Heart attack Neg Hx   ° ° °Social History  ° °Socioeconomic History  ° Marital status: Married  °  Spouse name: Not on file  ° Number of children: 2  ° Years of education: 12 grade  ° Highest education level: Not on file  °Occupational History  ° Not on file  °Tobacco Use  ° Smoking status: Never  ° Smokeless tobacco: Never  °Vaping Use  ° Vaping Use: Never used  °Substance  and Sexual Activity  ° Alcohol use: Never  ° Drug use: Never  ° Sexual activity: Not on file  °Other Topics Concern  ° Not on file  °Social History Narrative  ° Not on file  ° °Social Determinants of Health  ° °Financial Resource Strain: Not on file  °Food Insecurity: Not on file  °Transportation Needs: Not on file  °Physical Activity: Not on file  °Stress: Not on file  °Social Connections: Not on file  °Intimate Partner Violence: Not on file  ° ° °Outpatient Medications Prior to Visit  °Medication Sig Dispense Refill  ° ACCU-CHEK GUIDE test strip USE 2 TWICE DAILY 200 each 0  ° amLODipine (NORVASC) 10 MG tablet Take 1 tablet (10 mg total) by mouth daily. 90 tablet 1  ° blood glucose meter kit and supplies KIT Use to check FSBS BID. (Dx: E11.29, E11.65, R80.9). 1 each 0  ° EPINEPHrine (EPIPEN 2-PAK) 0.3 mg/0.3 mL IJ SOAJ injection Inject 0.3 mg into the muscle as needed for anaphylaxis. 2 each 0  ° lisinopril (ZESTRIL) 40 MG tablet Take 1 tablet (40 mg total) by mouth daily. 90 tablet 3  ° metFORMIN (GLUCOPHAGE XR) 500 MG 24 hr tablet Take one   twice daily with meals. 180 tablet 3  ° Iron, Ferrous Sulfate, 325 (65 Fe) MG TABS Take 325 mg by mouth daily. (Patient not taking: Reported on 04/17/2021) 60 tablet 3  ° nirmatrelvir/ritonavir EUA, renal dosing, (PAXLOVID) TBPK Take nirmatrelvir (150 mg) 2 tablets twice daily for 5 days and ritonavir (100 mg) one tablet twice daily for 5 days. 20 tablet 0  ° °No facility-administered medications prior to visit.  ° ° °Allergies  °Allergen Reactions  ° Bee Venom Swelling  ° Other Other (See Comments)  °  Squash- hives  ° ° °ROS °Review of Systems  °Constitutional:  Negative for chills, diaphoresis, fatigue, fever and unexpected weight change.  °HENT: Negative.    °Eyes:  Negative for photophobia and visual disturbance.  °Respiratory: Negative.    °Cardiovascular: Negative.   °Gastrointestinal:  Negative for anal bleeding and blood in stool.  °Genitourinary:  Negative for  difficulty urinating, frequency and hematuria.  °Musculoskeletal:  Negative for gait problem and joint swelling.  °Neurological:  Negative for speech difficulty and weakness.  °Psychiatric/Behavioral: Negative.    ° °  °Objective:  °  °Physical Exam °Vitals and nursing note reviewed.  °Constitutional:   °   General: He is not in acute distress. °   Appearance: Normal appearance. He is not ill-appearing, toxic-appearing or diaphoretic.  °HENT:  °   Head: Normocephalic and atraumatic.  °   Right Ear: External ear normal.  °   Left Ear: External ear normal.  °Eyes:  °   General: No scleral icterus.    °   Right eye: No discharge.     °   Left eye: No discharge.  °   Extraocular Movements: Extraocular movements intact.  °   Conjunctiva/sclera: Conjunctivae normal.  °Cardiovascular:  °   Rate and Rhythm: Normal rate and regular rhythm.  °Pulmonary:  °   Effort: Pulmonary effort is normal.  °   Breath sounds: Normal breath sounds.  °Skin: °   General: Skin is warm and dry.  °Neurological:  °   Mental Status: He is alert and oriented to person, place, and time.  °Psychiatric:     °   Mood and Affect: Mood normal.     °   Behavior: Behavior normal.  ° ° °BP (!) 146/94 (BP Location: Right Arm, Patient Position: Sitting, Cuff Size: Large)    Pulse 73    Temp (!) 97.3 °F (36.3 °C) (Temporal)    Ht 6' 1" (1.854 m)    Wt 274 lb 12.8 oz (124.6 kg)    SpO2 97%    BMI 36.26 kg/m²  °Wt Readings from Last 3 Encounters:  °04/17/21 274 lb 12.8 oz (124.6 kg)  °01/09/21 269 lb (122 kg)  °10/17/20 271 lb (122.9 kg)  ° ° ° °Health Maintenance Due  °Topic Date Due  ° FOOT EXAM  05/17/2020  ° ° °There are no preventive care reminders to display for this patient. ° °Lab Results  °Component Value Date  ° TSH 0.83 09/12/2020  ° °Lab Results  °Component Value Date  ° WBC 4.2 04/17/2021  ° HGB 12.4 (L) 04/17/2021  ° HCT 39.4 04/17/2021  ° MCV 66.3 (L) 04/17/2021  ° PLT 209.0 04/17/2021  ° °Lab Results  °Component Value Date  ° NA 137 04/17/2021   ° K 3.8 04/17/2021  ° CO2 23 04/17/2021  ° GLUCOSE 92 04/17/2021  ° BUN 18 04/17/2021  ° CREATININE 1.09 04/17/2021  ° BILITOT 0.7 01/09/2021  °   ALKPHOS 85 01/09/2021  ° AST 20 01/09/2021  ° ALT 10 01/09/2021  ° PROT 7.4 01/09/2021  ° ALBUMIN 4.3 01/09/2021  ° CALCIUM 8.7 04/17/2021  ° ANIONGAP 10 05/25/2015  ° GFR 83.56 04/17/2021  ° °Lab Results  °Component Value Date  ° CHOL 144 09/12/2020  ° °Lab Results  °Component Value Date  ° HDL 31.50 (L) 09/12/2020  ° °Lab Results  °Component Value Date  ° LDLCALC 103 (H) 09/12/2020  ° °Lab Results  °Component Value Date  ° TRIG 49.0 09/12/2020  ° °Lab Results  °Component Value Date  ° CHOLHDL 5 09/12/2020  ° °Lab Results  °Component Value Date  ° HGBA1C 7.3 (H) 04/17/2021  ° ° °  °Assessment & Plan:  ° °Problem List Items Addressed This Visit   ° °  ° Cardiovascular and Mediastinum  ° Essential hypertension  ° Relevant Orders  ° Basic metabolic panel (Completed)  ° CBC (Completed)  ° Urinalysis, Routine w reflex microscopic (Completed)  °  ° Endocrine  ° Type 2 diabetes mellitus without complication, without long-term current use of insulin (HCC) - Primary  ° Relevant Orders  ° Basic metabolic panel (Completed)  ° Hemoglobin A1c (Completed)  °  ° Other  ° Microcytic anemia  ° Relevant Orders  ° CBC (Completed)  ° POC Hemoccult Bld/Stl (3-Cd Home Screen)  ° Thalassemia and Hemoglobinopathy Comprehensive Evaluation (Completed)  ° Screening for HIV (human immunodeficiency virus)  ° Relevant Orders  ° HIV Antibody (routine testing w rflx) (Completed)  ° Homozygous alpha thalassemia (HCC)  ° ° °No orders of the defined types were placed in this encounter. ° ° °Follow-up: Return in about 3 months (around 07/16/2021).  °Information on managing hypertension given to patient.  He believes that he can bring his pressure down on his own.  Pressures at home have been lower and we can go for that.  Microcytic anemia with normal iron levels.  Am checking thalassemia panel.  Hemoccult  cards sent home with patient. ° °William Alfred Kremer, MD °

## 2021-04-18 LAB — BASIC METABOLIC PANEL
BUN: 18 mg/dL (ref 6–23)
CO2: 23 mEq/L (ref 19–32)
Calcium: 8.7 mg/dL (ref 8.4–10.5)
Chloride: 106 mEq/L (ref 96–112)
Creatinine, Ser: 1.09 mg/dL (ref 0.40–1.50)
GFR: 83.56 mL/min (ref >60.00)
Glucose, Bld: 92 mg/dL (ref 70–99)
Potassium: 3.8 mEq/L (ref 3.5–5.1)
Sodium: 137 mEq/L (ref 135–145)

## 2021-04-18 LAB — CBC
HCT: 38 % — ABNORMAL LOW (ref 39.0–52.0)
Hemoglobin: 12 g/dL — ABNORMAL LOW (ref 13.0–17.0)
MCHC: 31.6 g/dL (ref 30.0–36.0)
MCV: 64.7 fl — ABNORMAL LOW (ref 78.0–100.0)
Platelets: 209 10*3/uL (ref 150.0–400.0)
RBC: 5.87 Mil/uL — ABNORMAL HIGH (ref 4.22–5.81)
RDW: 17.5 % — ABNORMAL HIGH (ref 11.5–15.5)
WBC: 4.2 10*3/uL (ref 4.0–10.5)

## 2021-04-18 LAB — URINALYSIS, ROUTINE W REFLEX MICROSCOPIC
Bilirubin Urine: NEGATIVE
Hgb urine dipstick: NEGATIVE
Ketones, ur: NEGATIVE
Leukocytes,Ua: NEGATIVE
Nitrite: NEGATIVE
RBC / HPF: NONE SEEN (ref 0–?)
Specific Gravity, Urine: 1.025 (ref 1.000–1.030)
Total Protein, Urine: 30 — AB
Urine Glucose: NEGATIVE
Urobilinogen, UA: 0.2 (ref 0.0–1.0)
WBC, UA: NONE SEEN (ref 0–?)
pH: 6 (ref 5.0–8.0)

## 2021-04-18 LAB — HEPATITIS C ANTIBODY
Hepatitis C Ab: NONREACTIVE
SIGNAL TO CUT-OFF: 0.13 (ref <1.00)

## 2021-04-18 LAB — HEMOGLOBIN A1C: Hgb A1c MFr Bld: 7.3 % — ABNORMAL HIGH (ref 4.6–6.5)

## 2021-04-18 LAB — HIV ANTIBODY (ROUTINE TESTING W REFLEX): HIV 1&2 Ab, 4th Generation: NONREACTIVE

## 2021-05-08 LAB — THALASSEMIA AND HEMOGLOBINOPATHY COMPREHENSIVE EVALUATION
Ferritin: 168 ng/mL (ref 38–380)
Fetal Hemoglobin Testing: 0 % (ref <2.0)
HCT: 39.4 % (ref 38.5–50.0)
Hemoglobin A2 - HGBRFX: 2.3 % (ref 2.2–3.2)
Hemoglobin: 12.4 g/dL — ABNORMAL LOW (ref 13.2–17.1)
Hgb A: 97.7 % (ref >96.0)
MCH: 20.9 pg — ABNORMAL LOW (ref 27.0–33.0)
MCHC: 31.5 g/dL — ABNORMAL LOW (ref 32.0–36.0)
MCV: 66.3 fL — ABNORMAL LOW (ref 80.0–100.0)
RBC: 5.94 10*6/uL — ABNORMAL HIGH (ref 4.20–5.80)
RDW: 18.7 % — ABNORMAL HIGH (ref 11.0–15.0)

## 2021-05-08 LAB — GENO PHENO REVIEW

## 2021-05-08 LAB — ALPHA THAL MUT ANALYSIS

## 2021-05-09 DIAGNOSIS — D560 Alpha thalassemia: Secondary | ICD-10-CM

## 2021-05-09 HISTORY — DX: Alpha thalassemia: D56.0

## 2021-05-15 ENCOUNTER — Telehealth: Payer: Self-pay | Admitting: Family Medicine

## 2021-05-15 DIAGNOSIS — G4730 Sleep apnea, unspecified: Secondary | ICD-10-CM

## 2021-05-15 NOTE — Telephone Encounter (Signed)
Darren Little from the [facility] called saying they do not make appointments by referrals, will need it placed as an Active Order instead.

## 2021-05-15 NOTE — Telephone Encounter (Signed)
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 at [phone], they said that his provider needs to put in a referral in order for them to schedule. Please advise. Call back for pt is [phone]. Pt needs done as soon as possible

## 2021-05-15 NOTE — Telephone Encounter (Signed)
Pt aware and said he will call them

## 2021-05-18 NOTE — Addendum Note (Signed)
Addended by: Jon Billings on: 05/18/2021 07:47 AM   Modules accepted: Orders

## 2021-06-27 ENCOUNTER — Encounter: Payer: Self-pay | Admitting: Primary Care

## 2021-06-27 ENCOUNTER — Encounter: Payer: Self-pay | Admitting: *Deleted

## 2021-06-27 ENCOUNTER — Other Ambulatory Visit: Payer: Self-pay

## 2021-06-27 ENCOUNTER — Ambulatory Visit (INDEPENDENT_AMBULATORY_CARE_PROVIDER_SITE_OTHER): Payer: 59 | Admitting: Primary Care

## 2021-06-27 VITALS — BP 180/100 | HR 82 | Temp 98.3°F | Ht 73.5 in | Wt 280.8 lb

## 2021-06-27 DIAGNOSIS — Z6837 Body mass index (BMI) 37.0-37.9, adult: Secondary | ICD-10-CM

## 2021-06-27 DIAGNOSIS — I10 Essential (primary) hypertension: Secondary | ICD-10-CM

## 2021-06-27 DIAGNOSIS — G4733 Obstructive sleep apnea (adult) (pediatric): Secondary | ICD-10-CM

## 2021-06-27 HISTORY — DX: Obstructive sleep apnea (adult) (pediatric): G47.33

## 2021-06-27 NOTE — Patient Instructions (Signed)
?  Sleep apnea is defined as period of 10 seconds or longer when you stop breathing at night. This can happen multiple times a night. Dx sleep apnea is when this occurs more than 5 times an hour.  ?  ?Mild OSA 5-15 apneic events an hour ?Moderate OSA 15-30 apneic events an hour ?Severe OSA > 30 apneic events an hour ?  ?Untreated sleep apnea puts you at higher risk for cardiac arrhythmias, pulmonary HTN, stroke and diabetes ?  ?Treatment options include weight loss, side sleeping position, oral appliance, CPAP therapy or referral to ENT for possible surgical options  ?  ?Recommendations: ?Focus on side sleeping position ?Work on weight loss efforts  ?Do not drive if experiencing excessive daytime sleepiness of fatigue  ?  ?Orders: ?Home sleep study re: snoring  ?  ?Follow-up: ?4-6 week visit to review sleep study results and discuss treatment options further ? ?

## 2021-06-27 NOTE — Assessment & Plan Note (Signed)
-   Uncontrolled; BP 180/100  ?- Needs refill of his blood pressure medication, he will call PCP today otherwise we can provide RX for 30 days   ?

## 2021-06-27 NOTE — Assessment & Plan Note (Signed)
-   Uncontrolled; BMI 36.5 ?- Encourage weight loss efforts. We set goal weigh of 250lbs  ?

## 2021-06-27 NOTE — Progress Notes (Signed)
Reviewed and agree with assessment/plan. ? ? ?Chesley Mires, MD ?[place] ?06/27/2021, 3:35 PM ?Pager:  [phone] ? ?

## 2021-06-27 NOTE — Assessment & Plan Note (Addendum)
-   HST in December 2020 showed moderate OSA, AHI 23.6/hr. He was intolerant to CPAP. Patient is DOT driver. He is asymptomatic. Denies difficulties sleeping, loud snoring or daytime sleepiness. His BMI is 36. Concern he still has sleep apnea, needs repeat HST to re-evaluate OSA. Discussed risk of untreated sleep apnea including cardiac arrhythmias, pulm HTN, stroke, DM. We briefly reviewed treatment options. Encourage weight loss efforts and side sleeping position. Advised against driving if experiencing excessive daytime sleepiness. Consider oral appliance or referral to ENT for surgical intervention or inspire device.  ?

## 2021-06-27 NOTE — Progress Notes (Signed)
? ?@Patient  ID: Darren Little, male    DOB: [birth year], 41 y.o.   MRN: [id number] ? ?Chief Complaint  ?Patient presents with  ? Consult  ? ? ?Referring provider: ?Libby Maw,* ? ?HPI: ?41 year old male never smoked.  Past medical history significant for hypertension, left ventricular hypertrophy, type 2 diabetes, hyperlipidemia, iron deficiency, thalassemia.  ? ?06/27/2021 ?Patient presents today for sleep consult. He is a [employer] driver. Hx OSA, originally dx in 2010. Last sleep study was in December 2020 which showed moderate OSA, AHI 23.6/hr - not accentuated in REM sleep. He was CPAP intolerant. He has no clinical symptoms. Denies daytime sleepiness or snoring. He does not want to be started back on CPAP. He sleeps better without CPAP mask. He also reports feeling light headed when using. He leads an active lifestyle. He has no respiratory symptoms. He is motivated to lose weight. His blood pressure is elevated today, he did not take his medications. He is planning on calling his primary care provider today for refills.  ? ?Sleep questionnaire ?Symptoms- Hx OSA, no daytime sleepiness or snoring  ?Prior sleep study- 03/08/2019 HST>> moderate OSA, AHI 23.6/hr ?Bedtime- 10-11pm ?Time to fall asleep- unsure  ?Nocturnal awakenings- every 3 hours  ?Out of bed/start of day-  different times  ?Weight changes- unsure  ?Do you operate heavy machinery- Yes, [employer] driver  ?Do you currently wear CPAP- No ?Do you current wear oxygen- No ? ?Allergies  ?Allergen Reactions  ? Bee Venom Swelling  ? Other Other (See Comments)  ?  Squash- hives  ? ? ?Immunization History  ?Administered Date(s) Administered  ? Influenza,inj,Quad PF,6+ Mos 01/09/2021  ? Pneumococcal Polysaccharide-23 01/19/2019  ? Tdap 06/09/2018, 04/29/2019  ? ? ?Past Medical History:  ?Diagnosis Date  ? Abnormal CT scan 08/2012  ? R axilla adenopathy  ? Abnormal platelets ([place]) 09/26/12  ? Large platelets  ? Abnormal RBC 09/26/12  ? Elliptocytes  ? Hx of  cardiovascular stress test   ? ETT-Myoview 7/14:  Normal, EF 54%, no ischemia  ? Hypertension   ? LVH (left ventricular hypertrophy)   ? 08/2712 echo-EF 55-60%, moderate LVH and mild LA dilatation.   ? Microcytosis   ? MCV 65  ? Mild sleep apnea   ? a. sleep study 9/14:  mild OSA, AHI 10/hour; O2 desat nadir 88% => recommend weight loss  ? Prediabetes   ? A1C 6.1% 08/2012  ? Uncontrolled type 2 diabetes mellitus with microalbuminuria 12/31/2018  ? ? ?Tobacco History: ?Social History  ? ?Tobacco Use  ?Smoking Status Never  ?Smokeless Tobacco Never  ? ?Counseling given: Not Answered ? ? ?Outpatient Medications Prior to Visit  ?Medication Sig Dispense Refill  ? ACCU-CHEK GUIDE test strip USE 2 TWICE DAILY 200 each 0  ? amLODipine (NORVASC) 10 MG tablet Take 1 tablet (10 mg total) by mouth daily. 90 tablet 1  ? blood glucose meter kit and supplies KIT Use to check FSBS BID. (Dx: E11.29, E11.65, R80.9). 1 each 0  ? EPINEPHrine (EPIPEN 2-PAK) 0.3 mg/0.3 mL IJ SOAJ injection Inject 0.3 mg into the muscle as needed for anaphylaxis. 2 each 0  ? lisinopril (ZESTRIL) 40 MG tablet Take 1 tablet (40 mg total) by mouth daily. 90 tablet 3  ? metFORMIN (GLUCOPHAGE XR) 500 MG 24 hr tablet Take one twice daily with meals. 180 tablet 3  ? Iron, Ferrous Sulfate, 325 (65 Fe) MG TABS Take 325 mg by mouth daily. (Patient not taking: Reported on 06/27/2021) 60 tablet  3  ? ?No facility-administered medications prior to visit.  ? ?Review of Systems ? ?Review of Systems  ?Constitutional:  Negative for fatigue.  ?HENT: Negative.    ?Respiratory:  Negative for apnea, cough and shortness of breath.   ?Cardiovascular: Negative.   ? ? ?Physical Exam ? ?BP (!) 180/100   Pulse 82   Temp 98.3 ?F (36.8 ?C) (Oral)   Ht 6' 1.5" (1.867 m)   Wt 280 lb 12.8 oz (127.4 kg)   SpO2 99%   BMI 36.54 kg/m?  ?Physical Exam ?Constitutional:   ?   Appearance: Normal appearance.  ?HENT:  ?   Head: Normocephalic and atraumatic.  ?   Mouth/Throat:  ?   Mouth: Mucous  membranes are moist.  ?   Pharynx: Oropharynx is clear.  ?Cardiovascular:  ?   Rate and Rhythm: Normal rate and regular rhythm.  ?Pulmonary:  ?   Effort: Pulmonary effort is normal.  ?   Breath sounds: Normal breath sounds. No wheezing, rhonchi or rales.  ?Musculoskeletal:     ?   General: Normal range of motion.  ?   Cervical back: Normal range of motion and neck supple.  ?Skin: ?   General: Skin is warm and dry.  ?Neurological:  ?   General: No focal deficit present.  ?   Mental Status: He is alert and oriented to person, place, and time. Mental status is at baseline.  ?Psychiatric:     ?   Mood and Affect: Mood normal.     ?   Behavior: Behavior normal.     ?   Thought Content: Thought content normal.     ?   Judgment: Judgment normal.  ?  ? ?Lab Results: ? ?CBC ?   ?Component Value Date/Time  ? WBC 4.2 04/17/2021 1433  ? RBC 5.94 (H) 04/17/2021 1440  ? HGB 12.4 (L) 04/17/2021 1440  ? HGB 12.7 (L) 12/30/2018 1604  ? HCT 39.4 04/17/2021 1440  ? HCT 42.0 12/30/2018 1604  ? PLT 209.0 04/17/2021 1433  ? PLT 264 12/30/2018 1604  ? MCV 66.3 (L) 04/17/2021 1440  ? MCV 69 (L) 12/30/2018 1604  ? MCH 20.9 (L) 04/17/2021 1440  ? MCHC 31.5 (L) 04/17/2021 1440  ? RDW 18.7 (H) 04/17/2021 1440  ? RDW 17.3 (H) 12/30/2018 1604  ? LYMPHSABS 2,094 11/30/2019 1507  ? MONOABS 0.6 05/25/2015 2210  ? EOSABS 201 11/30/2019 1507  ? BASOSABS 42 11/30/2019 1507  ? ? ?BMET ?   ?Component Value Date/Time  ? NA 137 04/17/2021 1433  ? NA 136 12/30/2018 1604  ? K 3.8 04/17/2021 1433  ? CL 106 04/17/2021 1433  ? CO2 23 04/17/2021 1433  ? GLUCOSE 92 04/17/2021 1433  ? BUN 18 04/17/2021 1433  ? BUN 14 12/30/2018 1604  ? CREATININE 1.09 04/17/2021 1433  ? CREATININE 1.10 09/06/2015 1229  ? CALCIUM 8.7 04/17/2021 1433  ? GFRNONAA 78 12/30/2018 1604  ? GFRAA 91 12/30/2018 1604  ? ? ?BNP ?No results found for: BNP ? ?ProBNP ?   ?Component Value Date/Time  ? PROBNP 22.0 10/16/2012 1704  ? ? ?Imaging: ?No results found. ? ? ?Assessment & Plan:  ? ?OSA  (obstructive sleep apnea) ?- HST in December 2020 showed moderate OSA, AHI 23.6/hr. He was intolerant to CPAP. Patient is [employer] driver. He is asymptomatic. Denies difficulties sleeping, loud snoring or daytime sleepiness. His BMI is 36. Concern he still has sleep apnea, needs repeat HST to re-evaluate OSA. Discussed risk  of untreated sleep apnea including cardiac arrhythmias, pulm HTN, stroke, DM. We briefly reviewed treatment options. Encourage weight loss efforts and side sleeping position. Advised against driving if experiencing excessive daytime sleepiness. Consider oral appliance or referral to ENT for surgical intervention or inspire device.  ? ?Essential hypertension ?- Uncontrolled; BP 180/100  ?- Needs refill of his blood pressure medication, he will call PCP today otherwise we can provide RX for 30 days   ? ?Class 2 severe obesity due to excess calories with serious comorbidity and body mass index (BMI) of 37.0 to 37.9 in adult [facility]) ?- Uncontrolled; BMI 36.5 ?- Encourage weight loss efforts. We set goal weigh of 250lbs \ ? ? ?Martyn Ehrich, NP ?06/27/2021 ? ?

## 2021-07-17 ENCOUNTER — Ambulatory Visit (INDEPENDENT_AMBULATORY_CARE_PROVIDER_SITE_OTHER): Payer: 59 | Admitting: Family Medicine

## 2021-07-17 ENCOUNTER — Encounter: Payer: Self-pay | Admitting: Family Medicine

## 2021-07-17 VITALS — BP 150/94 | HR 72 | Temp 98.3°F | Ht 73.0 in | Wt 280.8 lb

## 2021-07-17 DIAGNOSIS — E785 Hyperlipidemia, unspecified: Secondary | ICD-10-CM

## 2021-07-17 DIAGNOSIS — I10 Essential (primary) hypertension: Secondary | ICD-10-CM

## 2021-07-17 DIAGNOSIS — D1721 Benign lipomatous neoplasm of skin and subcutaneous tissue of right arm: Secondary | ICD-10-CM

## 2021-07-17 DIAGNOSIS — E1169 Type 2 diabetes mellitus with other specified complication: Secondary | ICD-10-CM | POA: Diagnosis not present

## 2021-07-17 DIAGNOSIS — M67432 Ganglion, left wrist: Secondary | ICD-10-CM

## 2021-07-17 DIAGNOSIS — E119 Type 2 diabetes mellitus without complications: Secondary | ICD-10-CM

## 2021-07-17 HISTORY — DX: Benign lipomatous neoplasm of skin and subcutaneous tissue of right arm: D17.21

## 2021-07-17 HISTORY — DX: Ganglion, left wrist: M67.432

## 2021-07-17 MED ORDER — ATORVASTATIN CALCIUM 20 MG PO TABS: 20.0000 mg | ORAL_TABLET | Freq: Every day | ORAL | 3 refills | Status: DC

## 2021-07-17 MED ORDER — AMLODIPINE BESYLATE 10 MG PO TABS: 10.0000 mg | ORAL_TABLET | Freq: Every day | ORAL | 1 refills | Status: DC

## 2021-07-17 MED ORDER — HYDROCHLOROTHIAZIDE 25 MG PO TABS: 25.0000 mg | ORAL_TABLET | Freq: Every day | ORAL | 3 refills | Status: DC

## 2021-07-17 MED ORDER — METFORMIN HCL ER 500 MG PO TB24: ORAL_TABLET | ORAL | 3 refills | Status: DC

## 2021-07-17 MED ORDER — LISINOPRIL 40 MG PO TABS: 40.0000 mg | ORAL_TABLET | Freq: Every day | ORAL | 3 refills | Status: DC

## 2021-07-17 NOTE — Progress Notes (Signed)
? ?Established Patient Office Visit ? ?Subjective:  ?Patient ID: Darren Little, male    DOB: 03-15-1981  Age: 41 y.o. MRN: [id number] ? ?CC:  ?Chief Complaint  ?Patient presents with  ? Follow-up  ?  3 month follow up, pt noticed knots on wrist, arms and shoulders x 1 month little tender to touch.   ? ? ?HPI ?Darren Little presents for follow-up of hypertension, diabetes hyperlipidemia and a knot on his left hand and right shoulder.  The knot on posterior left hand is tender at times.  Soft mass in right shoulder is asymptomatic.  Patient self discontinued Mevacor and HCTZ. ? ? ?Past Medical History:  ?Diagnosis Date  ? Abnormal CT scan 08/2012  ? R axilla adenopathy  ? Abnormal platelets ([place]) 09/26/12  ? Large platelets  ? Abnormal RBC 09/26/12  ? Elliptocytes  ? Hx of cardiovascular stress test   ? ETT-Myoview 7/14:  Normal, EF 54%, no ischemia  ? Hypertension   ? LVH (left ventricular hypertrophy)   ? 08/2712 echo-EF 55-60%, moderate LVH and mild LA dilatation.   ? Microcytosis   ? MCV 65  ? Mild sleep apnea   ? a. sleep study 9/14:  mild OSA, AHI 10/hour; O2 desat nadir 88% => recommend weight loss  ? Prediabetes   ? A1C 6.1% 08/2012  ? Uncontrolled type 2 diabetes mellitus with microalbuminuria 12/31/2018  ? ? ?Past Surgical History:  ?Procedure Laterality Date  ? HAND SURGERY Right 2000  ? Right hand, 5years ago  ? ? ?Family History  ?Problem Relation Age of Onset  ? Heart disease Mother   ? Hypertension Mother   ? Hypertension Maternal Grandfather   ? Heart attack Neg Hx   ? ? ?Social History  ? ?Socioeconomic History  ? Marital status: Married  ?  Spouse name: Not on file  ? Number of children: 2  ? Years of education: 12 grade  ? Highest education level: Not on file  ?Occupational History  ? Not on file  ?Tobacco Use  ? Smoking status: Never  ? Smokeless tobacco: Never  ?Vaping Use  ? Vaping Use: Never used  ?Substance and Sexual Activity  ? Alcohol use: Never  ? Drug use: Never  ? Sexual activity: Not on  file  ?Other Topics Concern  ? Not on file  ?Social History Narrative  ? Not on file  ? ?Social Determinants of Health  ? ?Financial Resource Strain: Not on file  ?Food Insecurity: Not on file  ?Transportation Needs: Not on file  ?Physical Activity: Not on file  ?Stress: Not on file  ?Social Connections: Not on file  ?Intimate Partner Violence: Not on file  ? ? ?Outpatient Medications Prior to Visit  ?Medication Sig Dispense Refill  ? ACCU-CHEK GUIDE test strip USE 2 TWICE DAILY 200 each 0  ? blood glucose meter kit and supplies KIT Use to check FSBS BID. (Dx: E11.29, E11.65, R80.9). 1 each 0  ? EPINEPHrine (EPIPEN 2-PAK) 0.3 mg/0.3 mL IJ SOAJ injection Inject 0.3 mg into the muscle as needed for anaphylaxis. 2 each 0  ? amLODipine (NORVASC) 10 MG tablet Take 1 tablet (10 mg total) by mouth daily. 90 tablet 1  ? lisinopril (ZESTRIL) 40 MG tablet Take 1 tablet (40 mg total) by mouth daily. 90 tablet 3  ? metFORMIN (GLUCOPHAGE XR) 500 MG 24 hr tablet Take one twice daily with meals. 180 tablet 3  ? ?No facility-administered medications prior to visit.  ? ? ?  Allergies  ?Allergen Reactions  ? Bee Venom Swelling  ? Other Other (See Comments)  ?  Squash- hives  ? ? ?ROS ?Review of Systems  ?Constitutional:  Negative for diaphoresis, fatigue, fever and unexpected weight change.  ?HENT: Negative.    ?Eyes:  Negative for photophobia and visual disturbance.  ?Respiratory:  Negative for chest tightness, shortness of breath and wheezing.   ?Cardiovascular:  Negative for chest pain.  ?Gastrointestinal:  Negative for abdominal distention.  ?Endocrine: Negative for polyphagia.  ?Genitourinary:  Negative for decreased urine volume, difficulty urinating, frequency and urgency.  ?Musculoskeletal:  Negative for gait problem and joint swelling.  ?Neurological:  Negative for speech difficulty, weakness and light-headedness.  ? ?  ?Objective:  ?  ?Physical Exam ?Vitals and nursing note reviewed.  ?Constitutional:   ?   General: He is not  in acute distress. ?   Appearance: Normal appearance. He is not ill-appearing or toxic-appearing.  ?HENT:  ?   Head: Normocephalic and atraumatic.  ?   Right Ear: External ear normal.  ?   Left Ear: External ear normal.  ?   Mouth/Throat:  ?   Mouth: Mucous membranes are moist.  ?   Pharynx: Oropharynx is clear. No oropharyngeal exudate or posterior oropharyngeal erythema.  ?Eyes:  ?   Extraocular Movements: Extraocular movements intact.  ?   Conjunctiva/sclera: Conjunctivae normal.  ?   Pupils: Pupils are equal, round, and reactive to light.  ?Neck:  ?   Vascular: No carotid bruit.  ?Cardiovascular:  ?   Rate and Rhythm: Normal rate and regular rhythm.  ?Pulmonary:  ?   Effort: Pulmonary effort is normal.  ?   Breath sounds: Normal breath sounds.  ?Abdominal:  ?   General: Bowel sounds are normal. There is no distension.  ?   Palpations: There is no mass.  ?   Tenderness: There is no abdominal tenderness. There is no guarding or rebound.  ?   Hernia: No hernia is present.  ?Musculoskeletal:  ?   Left hand: Swelling present. No tenderness or bony tenderness. Normal range of motion.  ?     Arms: ? ?   Cervical back: No rigidity or tenderness.  ?Lymphadenopathy:  ?   Cervical: No cervical adenopathy.  ?Skin: ?   General: Skin is warm and dry.  ?Neurological:  ?   Mental Status: He is alert and oriented to person, place, and time.  ?Psychiatric:     ?   Mood and Affect: Mood normal.     ?   Behavior: Behavior normal.  ? ? ?BP (!) 150/94 (BP Location: Left Arm, Cuff Size: Large)   Pulse 72   Temp 98.3 ?F (36.8 ?C) (Temporal)   Ht 6' 1"  (1.854 m)   Wt 280 lb 12.8 oz (127.4 kg)   SpO2 98%   BMI 37.05 kg/m?  ?Wt Readings from Last 3 Encounters:  ?07/17/21 280 lb 12.8 oz (127.4 kg)  ?06/27/21 280 lb 12.8 oz (127.4 kg)  ?04/17/21 274 lb 12.8 oz (124.6 kg)  ? ? ? ?Health Maintenance Due  ?Topic Date Due  ? FOOT EXAM  05/17/2020  ? ? ?There are no preventive care reminders to display for this patient. ? ?Lab Results   ?Component Value Date  ? TSH 0.83 09/12/2020  ? ?Lab Results  ?Component Value Date  ? WBC 4.2 04/17/2021  ? HGB 12.4 (L) 04/17/2021  ? HCT 39.4 04/17/2021  ? MCV 66.3 (L) 04/17/2021  ? PLT 209.0 04/17/2021  ? ?  Lab Results  ?Component Value Date  ? NA 137 04/17/2021  ? K 3.8 04/17/2021  ? CO2 23 04/17/2021  ? GLUCOSE 92 04/17/2021  ? BUN 18 04/17/2021  ? CREATININE 1.09 04/17/2021  ? BILITOT 0.7 01/09/2021  ? ALKPHOS 85 01/09/2021  ? AST 20 01/09/2021  ? ALT 10 01/09/2021  ? PROT 7.4 01/09/2021  ? ALBUMIN 4.3 01/09/2021  ? CALCIUM 8.7 04/17/2021  ? ANIONGAP 10 05/25/2015  ? GFR 83.56 04/17/2021  ? ?Lab Results  ?Component Value Date  ? CHOL 144 09/12/2020  ? ?Lab Results  ?Component Value Date  ? HDL 31.50 (L) 09/12/2020  ? ?Lab Results  ?Component Value Date  ? LDLCALC 103 (H) 09/12/2020  ? ?Lab Results  ?Component Value Date  ? TRIG 49.0 09/12/2020  ? ?Lab Results  ?Component Value Date  ? CHOLHDL 5 09/12/2020  ? ?Lab Results  ?Component Value Date  ? HGBA1C 7.3 (H) 04/17/2021  ? ? ?  ?Assessment & Plan:  ? ?Problem List Items Addressed This Visit   ? ?  ? Cardiovascular and Mediastinum  ? Essential hypertension  ? Relevant Medications  ? amLODipine (NORVASC) 10 MG tablet  ? lisinopril (ZESTRIL) 40 MG tablet  ? hydrochlorothiazide (HYDRODIURIL) 25 MG tablet  ? atorvastatin (LIPITOR) 20 MG tablet  ? Other Relevant Orders  ? CBC  ? Basic metabolic panel  ?  ? Endocrine  ? Hyperlipidemia associated with type 2 diabetes mellitus ([place]) - Primary  ? Relevant Medications  ? amLODipine (NORVASC) 10 MG tablet  ? metFORMIN (GLUCOPHAGE XR) 500 MG 24 hr tablet  ? lisinopril (ZESTRIL) 40 MG tablet  ? hydrochlorothiazide (HYDRODIURIL) 25 MG tablet  ? atorvastatin (LIPITOR) 20 MG tablet  ? Other Relevant Orders  ? Basic metabolic panel  ? Lipid panel  ? Urinalysis, Routine w reflex microscopic  ? Microalbumin / creatinine urine ratio  ? Type 2 diabetes mellitus without complication, without long-term current use of insulin  ([place])  ? Relevant Medications  ? metFORMIN (GLUCOPHAGE XR) 500 MG 24 hr tablet  ? lisinopril (ZESTRIL) 40 MG tablet  ? atorvastatin (LIPITOR) 20 MG tablet  ? Other Relevant Orders  ? Basic metabolic panel  ? Hemo

## 2021-07-18 LAB — LIPID PANEL
Cholesterol: 156 mg/dL (ref 0–200)
HDL: 35 mg/dL — ABNORMAL LOW (ref >39.00)
LDL Cholesterol: 110 mg/dL — ABNORMAL HIGH (ref 0–99)
NonHDL: 121.23
Total CHOL/HDL Ratio: 4
Triglycerides: 54 mg/dL (ref 0.0–149.0)
VLDL: 10.8 mg/dL (ref 0.0–40.0)

## 2021-07-18 LAB — URINALYSIS, ROUTINE W REFLEX MICROSCOPIC
Bilirubin Urine: NEGATIVE
Hgb urine dipstick: NEGATIVE
Ketones, ur: NEGATIVE
Leukocytes,Ua: NEGATIVE
Nitrite: NEGATIVE
RBC / HPF: NONE SEEN (ref 0–?)
Specific Gravity, Urine: 1.025 (ref 1.000–1.030)
Total Protein, Urine: 30 — AB
Urine Glucose: NEGATIVE
Urobilinogen, UA: 0.2 (ref 0.0–1.0)
pH: 6 (ref 5.0–8.0)

## 2021-07-18 LAB — CBC
HCT: 38.3 % — ABNORMAL LOW (ref 39.0–52.0)
Hemoglobin: 12.2 g/dL — ABNORMAL LOW (ref 13.0–17.0)
MCHC: 31.8 g/dL (ref 30.0–36.0)
MCV: 64.9 fl — ABNORMAL LOW (ref 78.0–100.0)
Platelets: 236 10*3/uL (ref 150.0–400.0)
RBC: 5.9 Mil/uL — ABNORMAL HIGH (ref 4.22–5.81)
RDW: 16.5 % — ABNORMAL HIGH (ref 11.5–15.5)
WBC: 5.2 10*3/uL (ref 4.0–10.5)

## 2021-07-18 LAB — BASIC METABOLIC PANEL
BUN: 18 mg/dL (ref 6–23)
CO2: 26 mEq/L (ref 19–32)
Calcium: 9 mg/dL (ref 8.4–10.5)
Chloride: 104 mEq/L (ref 96–112)
Creatinine, Ser: 1.09 mg/dL (ref 0.40–1.50)
GFR: 83.42 mL/min (ref >60.00)
Glucose, Bld: 116 mg/dL — ABNORMAL HIGH (ref 70–99)
Potassium: 3.9 mEq/L (ref 3.5–5.1)
Sodium: 137 mEq/L (ref 135–145)

## 2021-07-18 LAB — MICROALBUMIN / CREATININE URINE RATIO
Creatinine,U: 191 mg/dL
Microalb Creat Ratio: 11.5 mg/g (ref 0.0–30.0)
Microalb, Ur: 21.9 mg/dL — ABNORMAL HIGH (ref 0.0–1.9)

## 2021-07-18 LAB — HEMOGLOBIN A1C: Hgb A1c MFr Bld: 7.9 % — ABNORMAL HIGH (ref 4.6–6.5)

## 2021-07-19 IMAGING — DX DG KNEE COMPLETE 4+V*R*
4 series · 4 of 4 positions shown · non-contrast
Comparison: None.

CLINICAL DATA: Fall 1 month ago, worsening pain

EXAM:
RIGHT KNEE - COMPLETE 4+ VIEW

[knee ap]
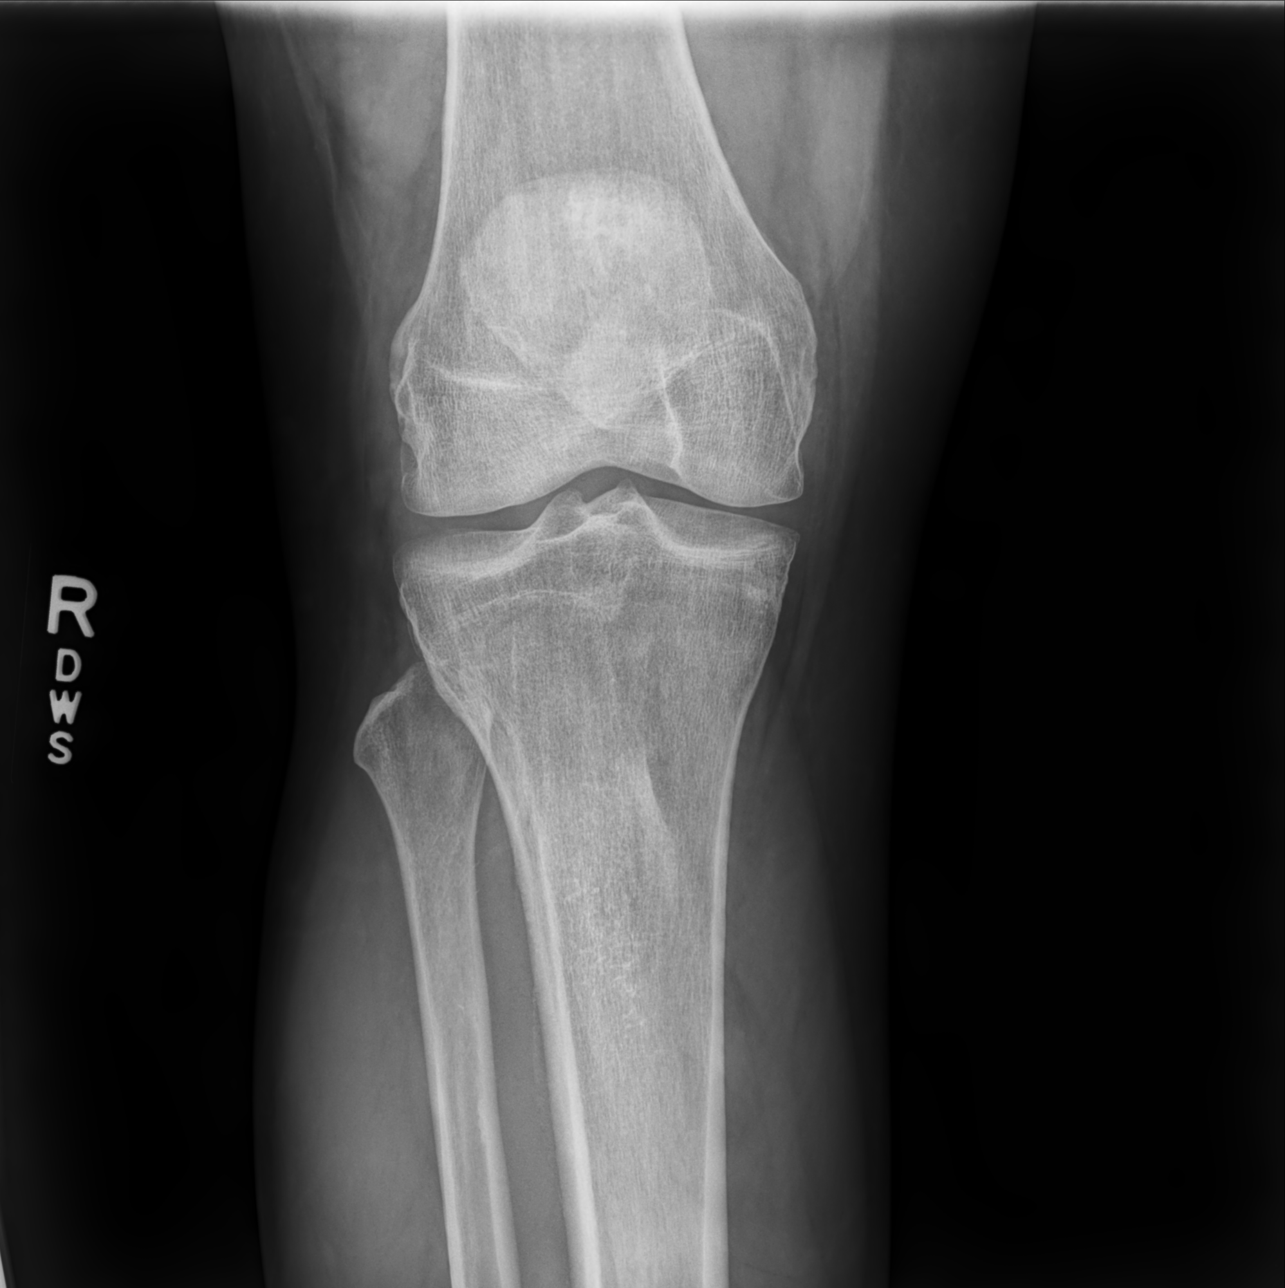

[knee lmo]
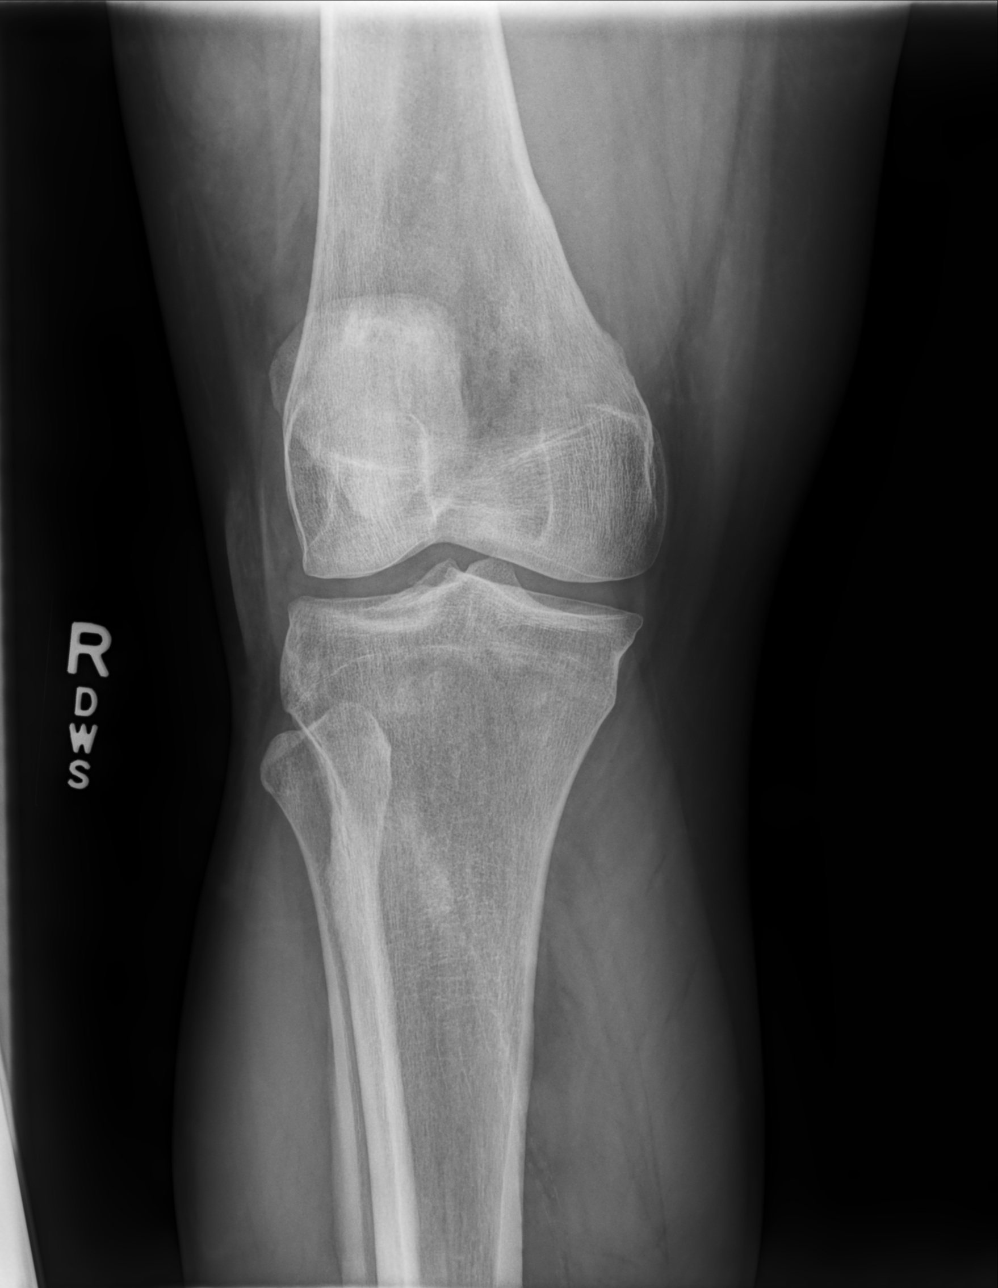

[knee mlo]
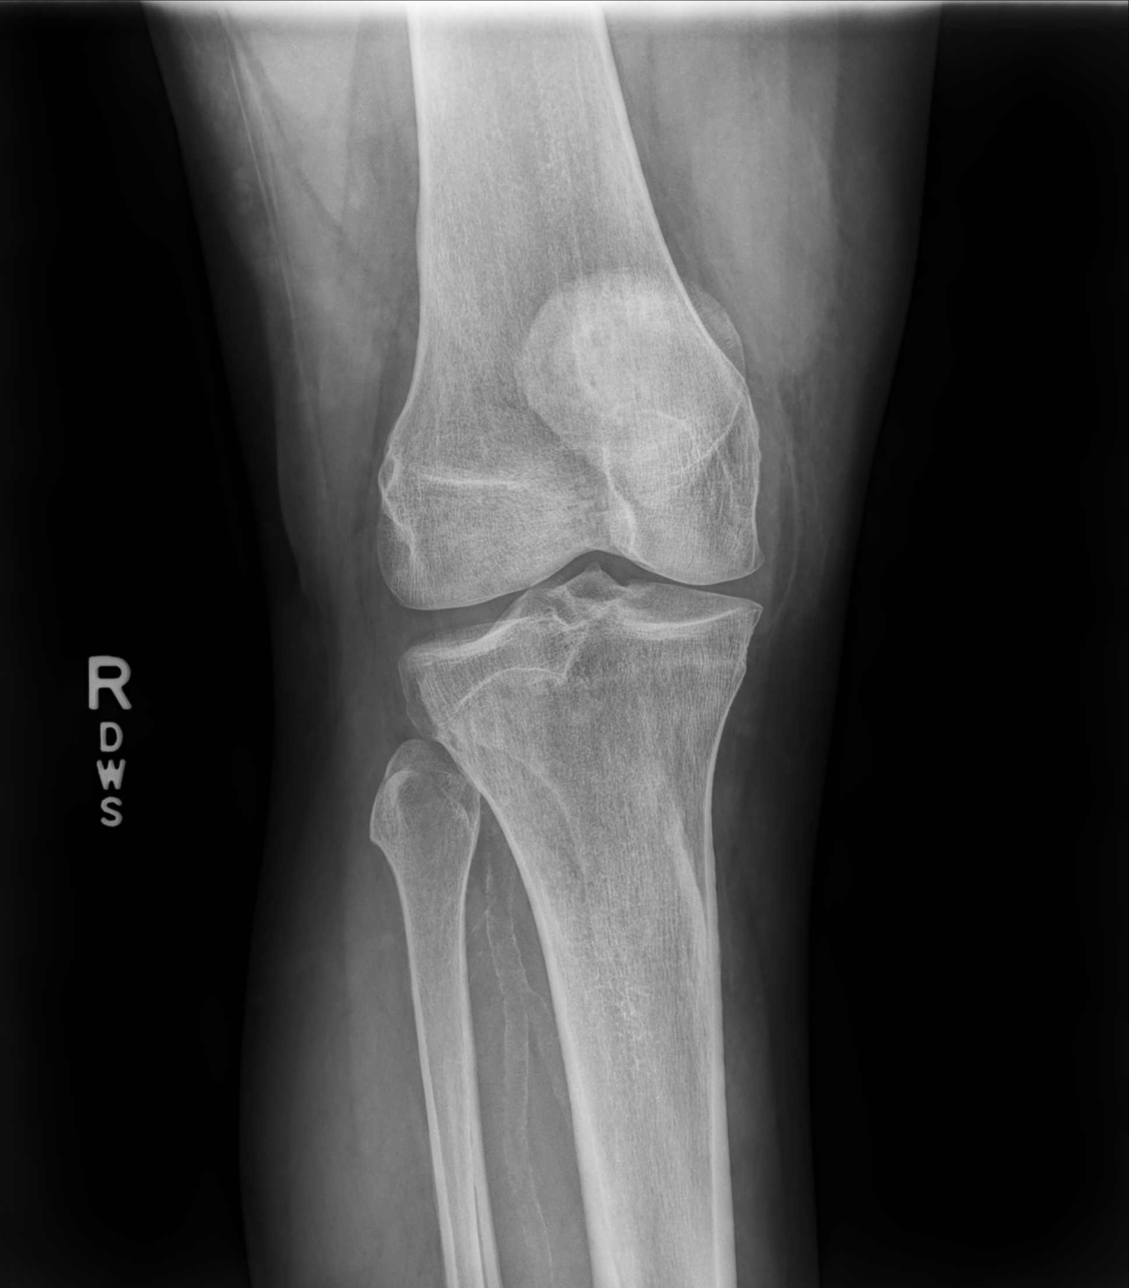

[knee lat]
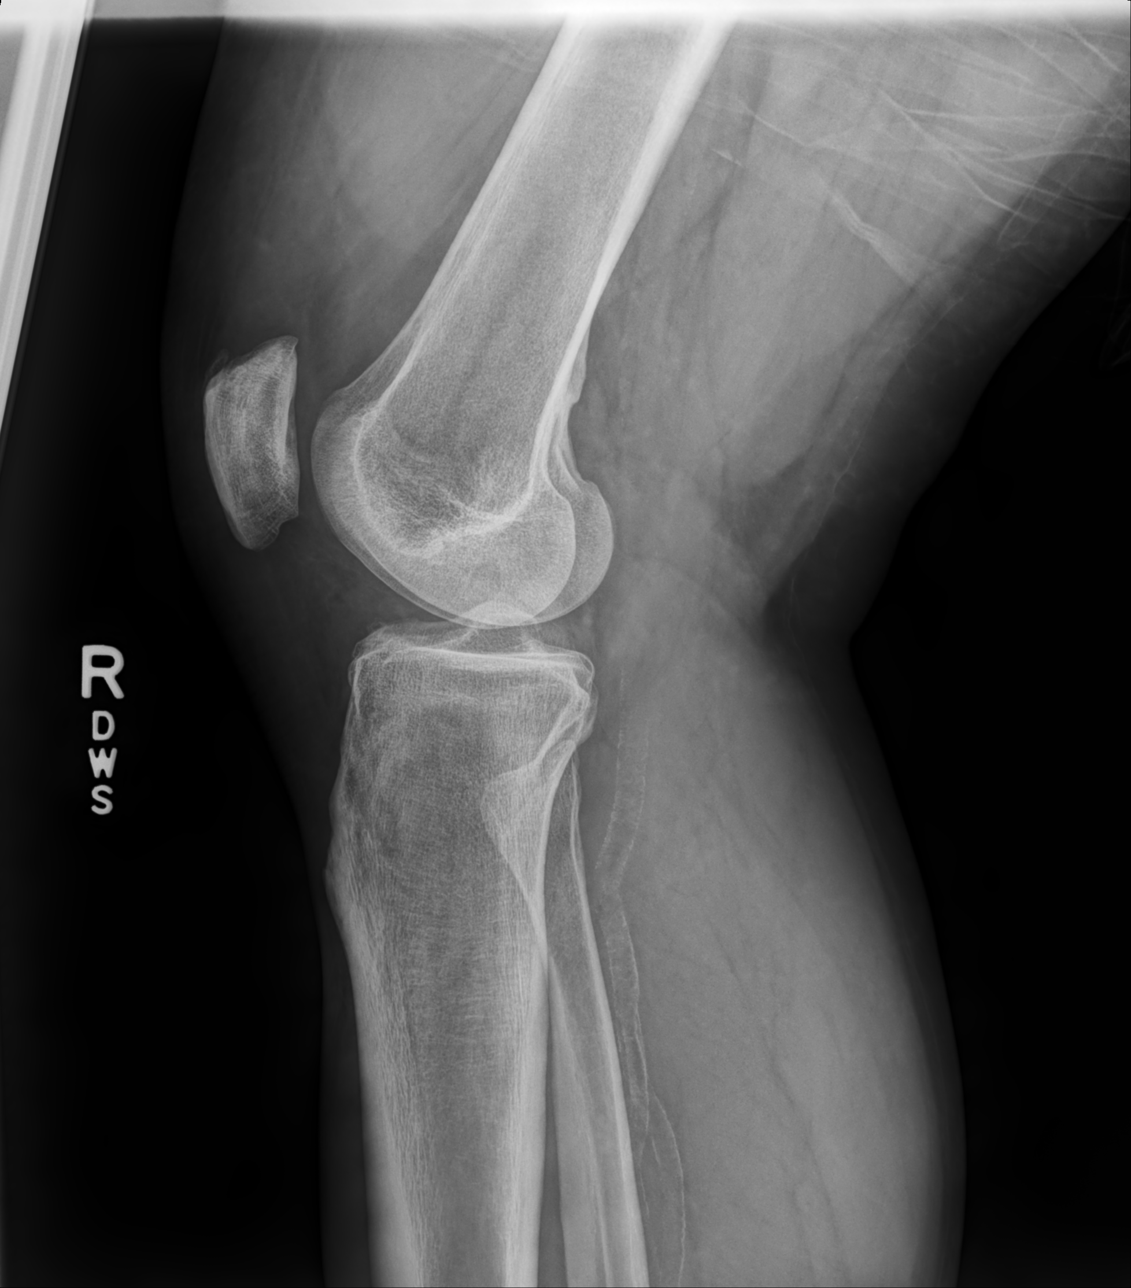

[4 of 4 positions shown; findings below may reference images not displayed]

FINDINGS: No acute bony abnormality. Specifically, no fracture, subluxation,
or dislocation. Joint spaces maintained. No joint effusion. Vascular
calcifications in the calf.
IMPRESSION: No acute bony abnormality.

Atherosclerosis.

## 2021-08-01 ENCOUNTER — Ambulatory Visit (INDEPENDENT_AMBULATORY_CARE_PROVIDER_SITE_OTHER): Payer: 59

## 2021-08-01 ENCOUNTER — Ambulatory Visit: Payer: 59 | Admitting: Orthopaedic Surgery

## 2021-08-01 DIAGNOSIS — M25532 Pain in left wrist: Secondary | ICD-10-CM

## 2021-08-01 NOTE — Progress Notes (Signed)
? ?Office Visit Note ?  ?Patient: Darren Little           ?Date of Birth: 12-Jun-1980           ?MRN: [id number] ?Visit Date: 08/01/2021 ?             ?Requested by: Libby Maw, MD ?[address] ?[place],  [place] [id number] ?PCP: Libby Maw, MD ? ? ?Assessment & Plan: ?Visit Diagnoses:  ?1. Pain in left wrist   ? ? ?Plan: Impression is left wrist dorsal ganglion cyst.  At this point, the patient has failed previous aspiration.  The symptoms he is having make it difficult to drive and he is interested in surgical excision.  Risks, benefits and possible complications reviewed.  Rehab and recovery time discussed.  All questions were answered. ? ?Follow-Up Instructions: Return for post-op.  ? ?Orders:  ?Orders Placed This Encounter  ?Procedures  ? XR Wrist Complete Left  ? ?No orders of the defined types were placed in this encounter. ? ? ? ? Procedures: ?No procedures performed ? ? ?Clinical Data: ?No additional findings. ? ? ?Subjective: ?Chief Complaint  ?Patient presents with  ? Left Wrist - Pain  ? ? ?HPI patient is a pleasant 41 year old ambidextrous gentleman who comes in today with concerns about a left dorsal ganglion cyst.  He noticed this about 2 to 3 years ago.  This initially fluctuated in size but has remained rather large for a while now.  He had this aspirated at some point which temporarily helped.  His symptoms appear to be worse with driving.  He did also notes occasional paresthesias to the index, long and ring fingers. ? ?Review of Systems as detailed in HPI.  All others reviewed and are negative. ? ? ?Objective: ?Vital Signs: There were no vitals taken for this visit. ? ?Physical Exam well-developed well-nourished gentleman in no acute distress.  Alert and oriented x3. ? ?Ortho Exam left wrist exam shows a large grape size cyst to the dorsum of the wrist.  This is nontender.  No skin changes.  He is neurovascular intact distally. ? ?Specialty Comments:  ?No specialty  comments available. ? ?Imaging: ?XR Wrist Complete Left ? ?Result Date: 08/01/2021 ?No acute or structural bony abnormality.  He does have soft tissue swelling to the dorsum of the wrist.  ? ? ?PMFS History: ?Patient Active Problem List  ? Diagnosis Date Noted  ? Ganglion cyst of dorsum of left wrist 07/17/2021  ? Lipoma of right upper extremity 07/17/2021  ? OSA (obstructive sleep apnea) 06/27/2021  ? 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 Left shoulder pain 01/09/2021  ? Iron deficiency 01/09/2021  ? Need for influenza vaccination 01/09/2021  ? 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  ? Type 2 diabetes mellitus without complication, without long-term current use of insulin ([place]) 01/25/2020  ? Anemia 01/25/2020  ? Patellofemoral pain syndrome of both knees 01/25/2020  ? Trigger ring finger of right hand 01/25/2020  ? Screening for HIV (human immunodeficiency virus) 03/16/2019  ? Hyperlipidemia associated with type 2 diabetes mellitus ([place]) 12/31/2018  ? Essential hypertension 11/07/2012  ? Hypertensive emergency 09/27/2012  ? Microcytic anemia 09/27/2012  ? Abnormal CT of the chest 09/27/2012  ? Abnormal platelets ([place]) 09/27/2012  ? LVH (left ventricular hypertrophy) 09/27/2012  ? Abnormal RBC morphology 09/27/2012  ? Pain of left calf 09/27/2012  ? Class 2 severe obesity due to excess calories with serious comorbidity and body mass index (BMI) of 37.0 to 37.9  in adult [facility]) 09/27/2012  ? ?Past Medical History:  ?Diagnosis Date  ? Abnormal CT scan 08/2012  ? R axilla adenopathy  ? Abnormal platelets ([place]) 09/26/12  ? Large platelets  ? Abnormal RBC 09/26/12  ? Elliptocytes  ? Hx of cardiovascular stress test   ? ETT-Myoview 7/14:  Normal, EF 54%, no ischemia  ? Hypertension   ? LVH (left ventricular hypertrophy)   ? 08/2712 echo-EF 55-60%, moderate LVH and mild LA dilatation.   ? Microcytosis   ? MCV 65  ? Mild sleep apnea   ? a. sleep study 9/14:  mild OSA, AHI 10/hour; O2 desat nadir 88% => recommend weight loss  ?  Prediabetes   ? A1C 6.1% 08/2012  ? Uncontrolled type 2 diabetes mellitus with microalbuminuria 12/31/2018  ?  ?Family History  ?Problem Relation Age of Onset  ? Heart disease Mother   ? Hypertension Mother   ? Hypertension Maternal Grandfather   ? Heart attack Neg Hx   ?  ?Past Surgical History:  ?Procedure Laterality Date  ? HAND SURGERY Right 2000  ? Right hand, 5years ago  ? ?Social History  ? ?Occupational History  ? Not on file  ?Tobacco Use  ? Smoking status: Never  ? Smokeless tobacco: Never  ?Vaping Use  ? Vaping Use: Never used  ?Substance and Sexual Activity  ? Alcohol use: Never  ? Drug use: Never  ? Sexual activity: Not on file  ? ? ? ? ? ? ?

## 2021-08-02 ENCOUNTER — Ambulatory Visit: Payer: 59

## 2021-08-02 DIAGNOSIS — G4733 Obstructive sleep apnea (adult) (pediatric): Secondary | ICD-10-CM | POA: Diagnosis not present

## 2021-08-14 ENCOUNTER — Telehealth: Payer: Self-pay | Admitting: Pulmonary Disease

## 2021-08-14 DIAGNOSIS — G4733 Obstructive sleep apnea (adult) (pediatric): Secondary | ICD-10-CM | POA: Diagnosis not present

## 2021-08-14 NOTE — Telephone Encounter (Signed)
Call patient ? ?Sleep study result ? ?Date of study: ?08/03/2021 ? ?Impression: ?Moderate obstructive sleep apnea ? ?Recommendation: ?DME referral ? ?Recommend CPAP therapy for moderate obstructive sleep apnea ? ?Auto titrating CPAP with pressure settings of 5-15 will be appropriate ? ?Encourage weight loss measures ? ?Follow-up in the office 4 to 6 weeks following initiation of treatment ? ?FYI : Geraldo Pitter ? ?

## 2021-08-15 NOTE — Telephone Encounter (Signed)
Should have OV to discuss results and treatment

## 2021-08-16 ENCOUNTER — Telehealth: Payer: Self-pay | Admitting: Primary Care

## 2021-08-16 NOTE — Telephone Encounter (Signed)
I left a message for the patient to call back about his results.  ?

## 2021-08-16 NOTE — Telephone Encounter (Signed)
Called and spoke with pt and have scheduled  him a f/u with BW to have HST and tx options further discussed. Nothing further needed. ?

## 2021-08-17 NOTE — Telephone Encounter (Signed)
Spoke with pt 5/17 letting him know that we needed to schedule an OV to have results of HST discussed and he verbalized understanding. An appt was scheduled. Nothing further needed.

## 2021-08-21 NOTE — Progress Notes (Deleted)
_0  ID: Darren Little, male    DOB: 01-26-1981, 41 y.o.   MRN: [id number]  No chief complaint on file.   Referring provider: Libby Maw  HPI: 40 year old male never smoked.  Past medical history significant for hypertension, left ventricular hypertrophy, type 2 diabetes, hyperlipidemia, iron deficiency, thalassemia.   Previous [facility] pulmonary encounter: 06/27/2021 Patient presents today for sleep consult. He is a [employer] driver. Hx OSA, originally dx in 2010. Last sleep study was in December 2020 which showed moderate OSA, AHI 23.6/hr - not accentuated in REM sleep. He was CPAP intolerant. He has no clinical symptoms. Denies daytime sleepiness or snoring. He does not want to be started back on CPAP. He sleeps better without CPAP mask. He also reports feeling light headed when using. He leads an active lifestyle. He has no respiratory symptoms. He is motivated to lose weight. His blood pressure is elevated today, he did not take his medications. He is planning on calling his primary care provider today for refills.   Sleep questionnaire Symptoms- Hx OSA, no daytime sleepiness or snoring  Prior sleep study- 03/08/2019 HST>> moderate OSA, AHI 23.6/hr Bedtime- 10-11pm Time to fall asleep- unsure  Nocturnal awakenings- every 3 hours  Out of bed/start of day-  different times  Weight changes- unsure  Do you operate heavy machinery- Yes, [employer] driver  Do you currently wear CPAP- No Do you current wear oxygen- No   08/22/2021- Interim hx  Patient presents today to review sleep study results. HST 08/03/21 showed moderate OSA, AHI 28.2/hr with SpO2 low 83% (baseline 94%).          Allergies  Allergen Reactions   Bee Venom Swelling   Other Other (See Comments)    Squash- hives    Immunization History  Administered Date(s) Administered   Influenza,inj,Quad PF,6+ Mos 01/09/2021   Pneumococcal Polysaccharide-23 01/19/2019   Tdap 06/09/2018, 04/29/2019    Past Medical  History:  Diagnosis Date   Abnormal CT scan 08/2012   R axilla adenopathy   Abnormal platelets (HCC) 09/26/12   Large platelets   Abnormal RBC 09/26/12   Elliptocytes   Hx of cardiovascular stress test    ETT-Myoview 7/14:  Normal, EF 54%, no ischemia   Hypertension    LVH (left ventricular hypertrophy)    08/2712 echo-EF 55-60%, moderate LVH and mild LA dilatation.    Microcytosis    MCV 65   Mild sleep apnea    a. 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    Tobacco History: Social History   Tobacco Use  Smoking Status Never  Smokeless Tobacco Never   Counseling given: Not Answered   Outpatient Medications Prior to Visit  Medication Sig Dispense Refill   ACCU-CHEK GUIDE test strip USE 2 TWICE DAILY 200 each 0   amLODipine (NORVASC) 10 MG tablet Take 1 tablet (10 mg total) by mouth daily. 90 tablet 1   atorvastatin (LIPITOR) 20 MG tablet Take 1 tablet (20 mg total) by mouth daily. 90 tablet 3   blood glucose meter kit and supplies KIT Use to check FSBS BID. (Dx: E11.29, E11.65, R80.9). 1 each 0   EPINEPHrine (EPIPEN 2-PAK) 0.3 mg/0.3 mL IJ SOAJ injection Inject 0.3 mg into the muscle as needed for anaphylaxis. 2 each 0   hydrochlorothiazide (HYDRODIURIL) 25 MG tablet Take 1  tablet (25 mg total) by mouth daily. 90 tablet 3   lisinopril (ZESTRIL) 40 MG tablet Take 1 tablet (40 mg total) by mouth daily. 90 tablet 3   metFORMIN (GLUCOPHAGE XR) 500 MG 24 hr tablet Take one twice daily with meals. 180 tablet 3   No facility-administered medications prior to visit.      Review of Systems  Review of Systems   Physical Exam  There were no vitals taken for this visit. Physical Exam   Lab Results:  CBC    Component Value Date/Time   WBC 5.2 07/17/2021 1534   RBC 5.90 (H) 07/17/2021 1534   HGB 12.2 (L) 07/17/2021 1534    HGB 12.7 (L) 12/30/2018 1604   HCT 38.3 (L) 07/17/2021 1534   HCT 42.0 12/30/2018 1604   PLT 236.0 07/17/2021 1534   PLT 264 12/30/2018 1604   MCV 64.9 Repeated and verified X2. (L) 07/17/2021 1534   MCV 69 (L) 12/30/2018 1604   MCH 20.9 (L) 04/17/2021 1440   MCHC 31.8 07/17/2021 1534   RDW 16.5 (H) 07/17/2021 1534   RDW 17.3 (H) 12/30/2018 1604   LYMPHSABS 2,094 11/30/2019 1507   MONOABS 0.6 05/25/2015 2210   EOSABS 201 11/30/2019 1507   BASOSABS 42 11/30/2019 1507    BMET    Component Value Date/Time   NA 137 07/17/2021 1534   NA 136 12/30/2018 1604   K 3.9 07/17/2021 1534   CL 104 07/17/2021 1534   CO2 26 07/17/2021 1534   GLUCOSE 116 (H) 07/17/2021 1534   BUN 18 07/17/2021 1534   BUN 14 12/30/2018 1604   CREATININE 1.09 07/17/2021 1534   CREATININE 1.10 09/06/2015 1229   CALCIUM 9.0 07/17/2021 1534   GFRNONAA 78 12/30/2018 1604   GFRAA 91 12/30/2018 1604    BNP No results found for: BNP  ProBNP    Component Value Date/Time   PROBNP 22.0 10/16/2012 1704    Imaging: XR Wrist Complete Left  Result Date: 08/03/2021 No acute or structural bony abnormality.  He does have soft tissue swelling to the dorsum of the wrist.    Assessment & Plan:   No problem-specific Assessment & Plan notes found for this encounter.     Martyn Ehrich, NP 08/21/2021

## 2021-08-22 ENCOUNTER — Ambulatory Visit: Payer: 59 | Admitting: Primary Care

## 2021-08-28 ENCOUNTER — Other Ambulatory Visit: Payer: Self-pay | Admitting: Family Medicine

## 2021-08-28 DIAGNOSIS — E119 Type 2 diabetes mellitus without complications: Secondary | ICD-10-CM

## 2021-10-16 ENCOUNTER — Ambulatory Visit (INDEPENDENT_AMBULATORY_CARE_PROVIDER_SITE_OTHER): Payer: 59 | Admitting: Family Medicine

## 2021-10-16 ENCOUNTER — Encounter: Payer: Self-pay | Admitting: Family Medicine

## 2021-10-16 VITALS — BP 146/80 | HR 65 | Temp 98.1°F | Ht 73.0 in | Wt 273.2 lb

## 2021-10-16 DIAGNOSIS — I10 Essential (primary) hypertension: Secondary | ICD-10-CM | POA: Diagnosis not present

## 2021-10-16 DIAGNOSIS — E785 Hyperlipidemia, unspecified: Secondary | ICD-10-CM

## 2021-10-16 DIAGNOSIS — E119 Type 2 diabetes mellitus without complications: Secondary | ICD-10-CM

## 2021-10-16 DIAGNOSIS — E1169 Type 2 diabetes mellitus with other specified complication: Secondary | ICD-10-CM

## 2021-10-16 DIAGNOSIS — Z91038 Other insect allergy status: Secondary | ICD-10-CM | POA: Diagnosis not present

## 2021-10-16 MED ORDER — EPINEPHRINE 0.3 MG/0.3ML IJ SOAJ: 0.3000 mg | INTRAMUSCULAR | 0 refills | Status: DC | PRN
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
## 2022-01-06 DIAGNOSIS — E785 Hyperlipidemia, unspecified: Secondary | ICD-10-CM | POA: Diagnosis not present

## 2022-01-06 DIAGNOSIS — E119 Type 2 diabetes mellitus without complications: Secondary | ICD-10-CM | POA: Diagnosis not present

## 2022-01-06 DIAGNOSIS — Z7984 Long term (current) use of oral hypoglycemic drugs: Secondary | ICD-10-CM | POA: Diagnosis not present

## 2022-01-06 DIAGNOSIS — I10 Essential (primary) hypertension: Secondary | ICD-10-CM | POA: Diagnosis not present

## 2022-01-06 DIAGNOSIS — Z6835 Body mass index (BMI) 35.0-35.9, adult: Secondary | ICD-10-CM | POA: Diagnosis not present

## 2022-01-06 DIAGNOSIS — Z833 Family history of diabetes mellitus: Secondary | ICD-10-CM | POA: Diagnosis not present

## 2022-01-06 DIAGNOSIS — Z8249 Family history of ischemic heart disease and other diseases of the circulatory system: Secondary | ICD-10-CM | POA: Diagnosis not present

## 2022-01-22 ENCOUNTER — Encounter: Payer: Self-pay | Admitting: Family Medicine

## 2022-01-22 ENCOUNTER — Ambulatory Visit (INDEPENDENT_AMBULATORY_CARE_PROVIDER_SITE_OTHER): Payer: 59 | Admitting: Family Medicine

## 2022-01-22 VITALS — BP 131/89 | HR 75 | Temp 98.2°F | Ht 73.0 in | Wt 267.2 lb

## 2022-01-22 DIAGNOSIS — Z23 Encounter for immunization: Secondary | ICD-10-CM

## 2022-01-22 DIAGNOSIS — I10 Essential (primary) hypertension: Secondary | ICD-10-CM | POA: Diagnosis not present

## 2022-01-22 DIAGNOSIS — E785 Hyperlipidemia, unspecified: Secondary | ICD-10-CM

## 2022-01-22 DIAGNOSIS — E1169 Type 2 diabetes mellitus with other specified complication: Secondary | ICD-10-CM | POA: Diagnosis not present

## 2022-01-22 DIAGNOSIS — T7840XS Allergy, unspecified, sequela: Secondary | ICD-10-CM

## 2022-01-22 DIAGNOSIS — E119 Type 2 diabetes mellitus without complications: Secondary | ICD-10-CM

## 2022-01-22 DIAGNOSIS — T7840XD Allergy, unspecified, subsequent encounter: Secondary | ICD-10-CM | POA: Diagnosis not present

## 2022-01-22 MED ORDER — TRIAMTERENE-HCTZ 37.5-25 MG PO TABS: 1.0000 | ORAL_TABLET | Freq: Every day | ORAL | 3 refills | Status: DC

## 2022-01-22 MED ORDER — LOVASTATIN 20 MG PO TABS: 20.0000 mg | ORAL_TABLET | Freq: Every day | ORAL | 3 refills | Status: DC

## 2022-01-22 NOTE — Progress Notes (Signed)
Established Patient Office Visit  Subjective   Patient ID: Darren Little, male    DOB: [birth year]  Age: 41 y.o. MRN: [id number]  Chief Complaint  Patient presents with   Follow-up    3 month follow for HTN. States that his atorvastatin is causing him to break out in hives.    HPI for follow-up of hypertension, diabetes and elevated cholesterol.  Has developed urticaria status post starting atorvastatin.  He had taken Mevacor in the past without issue.  Blood pressure has been running in the 140s over upper 80s at home.  He is stressed right now.  Trying to buy a home and the powers that be and that interaction keep changing prices on him.  Continues with metformin for diabetes.  Has been able to lose 13 pounds.    Review of Systems  Constitutional: Negative.   HENT: Negative.    Eyes:  Negative for blurred vision, discharge and redness.  Respiratory: Negative.    Cardiovascular: Negative.   Gastrointestinal:  Negative for abdominal pain.  Genitourinary: Negative.   Musculoskeletal: Negative.  Negative for myalgias.  Skin:  Negative for rash.  Neurological:  Negative for tingling, loss of consciousness and weakness.  Endo/Heme/Allergies:  Negative for polydipsia.      Objective:     BP (!) 150/93 (BP Location: Right Arm, Patient Position: Sitting, Cuff Size: Large)   Pulse 75   Temp 98.2 F (36.8 C) (Temporal)   Ht '6\' 1"'$  (1.854 m)   Wt 267 lb 3.2 oz (121.2 kg)   SpO2 99%   BMI 35.25 kg/m  BP Readings from Last 3 Encounters:  01/22/22 (!) 150/93  10/16/21 (!) 146/80  07/17/21 (!) 150/94   Wt Readings from Last 3 Encounters:  01/22/22 267 lb 3.2 oz (121.2 kg)  10/16/21 273 lb 3.2 oz (123.9 kg)  07/17/21 280 lb 12.8 oz (127.4 kg)      Physical Exam Constitutional:      General: He is not in acute distress.    Appearance: Normal appearance. He is not ill-appearing, toxic-appearing or diaphoretic.  HENT:     Head: Normocephalic and atraumatic.     Right  Ear: External ear normal.     Left Ear: External ear normal.     Mouth/Throat:     Mouth: Mucous membranes are moist.     Pharynx: Oropharynx is clear. No oropharyngeal exudate or posterior oropharyngeal erythema.  Eyes:     General: No scleral icterus.       Right eye: No discharge.        Left eye: No discharge.     Extraocular Movements: Extraocular movements intact.     Conjunctiva/sclera: Conjunctivae normal.     Pupils: Pupils are equal, round, and reactive to light.  Cardiovascular:     Rate and Rhythm: Normal rate and regular rhythm.  Pulmonary:     Effort: Pulmonary effort is normal. No respiratory distress.     Breath sounds: Normal breath sounds.  Musculoskeletal:     Cervical back: No rigidity or tenderness.  Skin:    General: Skin is warm and dry.  Neurological:     Mental Status: He is alert and oriented to person, place, and time.  Psychiatric:        Mood and Affect: Mood normal.        Behavior: Behavior normal.      No results found for any visits on 01/22/22.    The 10-year ASCVD risk score (Arnett  DK, et al., 2019) is: 15.6%    Assessment & Plan:   Problem List Items Addressed This Visit       Cardiovascular and Mediastinum   Essential hypertension - Primary   Relevant Medications   lovastatin (MEVACOR) 20 MG tablet   triamterene-hydrochlorothiazide (MAXZIDE-25) 37.5-25 MG tablet   Other Relevant Orders   Comprehensive metabolic panel     Endocrine   Hyperlipidemia associated with type 2 diabetes mellitus (HCC)   Relevant Medications   lovastatin (MEVACOR) 20 MG tablet   triamterene-hydrochlorothiazide (MAXZIDE-25) 37.5-25 MG tablet   Other Relevant Orders   LDL cholesterol, direct   Comprehensive metabolic panel   Type 2 diabetes mellitus without complication, without long-term current use of insulin (HCC)   Relevant Medications   lovastatin (MEVACOR) 20 MG tablet   Other Relevant Orders   Comprehensive metabolic panel   Hemoglobin  A1c     Other   Need for influenza vaccination   Relevant Orders   Flu Vaccine QUAD 6+ mos PF IM (Fluarix Quad PF)   Other Visit Diagnoses     Allergic reaction, sequela           Return in about 3 months (around 04/24/2022).  Discontinue HCTZ and atorvastatin.  We will start Maxide 37.5/25 daily.  Continue continue amlodipine and lisinopril.  Continue metformin.  Recheck of blood pressure was 131/89.  We will restart Mevacor.  Fortunately he has taken this drug in the past and had no issue with it.  He will let me know if he develops any rash or difficulties with it.  Nonfasting today.  Encouraged ongoing weight loss journey.    Libby Maw, MD

## 2022-01-23 LAB — COMPREHENSIVE METABOLIC PANEL
ALT: 14 U/L (ref 0–53)
AST: 17 U/L (ref 0–37)
Albumin: 4.4 g/dL (ref 3.5–5.2)
Alkaline Phosphatase: 88 U/L (ref 39–117)
BUN: 17 mg/dL (ref 6–23)
CO2: 29 mEq/L (ref 19–32)
Calcium: 9.3 mg/dL (ref 8.4–10.5)
Chloride: 101 mEq/L (ref 96–112)
Creatinine, Ser: 1.18 mg/dL (ref 0.40–1.50)
GFR: 75.57 mL/min (ref >60.00)
Glucose, Bld: 145 mg/dL — ABNORMAL HIGH (ref 70–99)
Potassium: 3.5 mEq/L (ref 3.5–5.1)
Sodium: 137 mEq/L (ref 135–145)
Total Bilirubin: 0.6 mg/dL (ref 0.2–1.2)
Total Protein: 7.6 g/dL (ref 6.0–8.3)

## 2022-01-23 LAB — HEMOGLOBIN A1C: Hgb A1c MFr Bld: 8.3 % — ABNORMAL HIGH (ref 4.6–6.5)

## 2022-01-23 LAB — LDL CHOLESTEROL, DIRECT: Direct LDL: 78 mg/dL

## 2022-01-23 MED ORDER — METFORMIN HCL ER 500 MG PO TB24: ORAL_TABLET | ORAL | 3 refills | Status: DC

## 2022-01-23 NOTE — Addendum Note (Signed)
Addended by: Jon Billings on: 01/23/2022 12:37 PM   Modules accepted: Orders

## 2022-04-23 ENCOUNTER — Ambulatory Visit: Payer: 59 | Admitting: Family Medicine

## 2022-04-27 ENCOUNTER — Ambulatory Visit (INDEPENDENT_AMBULATORY_CARE_PROVIDER_SITE_OTHER): Payer: Managed Care, Other (non HMO) | Admitting: Family Medicine

## 2022-04-27 ENCOUNTER — Encounter: Payer: Self-pay | Admitting: Family Medicine

## 2022-04-27 VITALS — BP 174/104 | HR 82 | Temp 98.2°F | Ht 73.0 in | Wt 286.2 lb

## 2022-04-27 DIAGNOSIS — R079 Chest pain, unspecified: Secondary | ICD-10-CM

## 2022-04-27 DIAGNOSIS — I10 Essential (primary) hypertension: Secondary | ICD-10-CM

## 2022-04-27 DIAGNOSIS — E119 Type 2 diabetes mellitus without complications: Secondary | ICD-10-CM

## 2022-04-27 DIAGNOSIS — E1169 Type 2 diabetes mellitus with other specified complication: Secondary | ICD-10-CM | POA: Diagnosis not present

## 2022-04-27 DIAGNOSIS — E785 Hyperlipidemia, unspecified: Secondary | ICD-10-CM

## 2022-04-27 HISTORY — DX: Chest pain, unspecified: R07.9

## 2022-04-27 MED ORDER — LOVASTATIN 20 MG PO TABS: 20.0000 mg | ORAL_TABLET | Freq: Every day | ORAL | 3 refills | Status: DC

## 2022-04-27 MED ORDER — METFORMIN HCL ER 500 MG PO TB24: 1000.0000 mg | ORAL_TABLET | Freq: Two times a day (BID) | ORAL | 1 refills | Status: DC

## 2022-04-27 MED ORDER — LISINOPRIL 40 MG PO TABS: 40.0000 mg | ORAL_TABLET | Freq: Every day | ORAL | 3 refills | Status: DC

## 2022-04-27 MED ORDER — TRIAMTERENE-HCTZ 37.5-25 MG PO TABS: 1.0000 | ORAL_TABLET | Freq: Every day | ORAL | 3 refills | Status: DC

## 2022-04-27 MED ORDER — AMLODIPINE BESYLATE 10 MG PO TABS: 10.0000 mg | ORAL_TABLET | Freq: Every day | ORAL | 1 refills | Status: DC

## 2022-04-27 NOTE — Progress Notes (Signed)
Established Patient Office Visit   Subjective:  Patient ID: Darren Little, male    DOB: 17-Jan-1981  Age: 42 y.o. MRN: [id number]  Chief Complaint  Patient presents with   Follow-up    3 month follow up some chest pains that come and go after taking medication.     HPI Encounter Diagnoses  Name Primary?   Hyperlipidemia associated with type 2 diabetes mellitus ([facility]) Yes   Essential hypertension    Type 2 diabetes mellitus without complication, without long-term current use of insulin (HCC)    Chest pain, unspecified type    For follow-up of hypertension, hyperlipidemia and type 2 diabetes.  Have increased the metformin to 1000 mg twice daily last visit.  That did not happen.  He has only been taking his amlodipine and not the Maxide and lisinopril for his blood pressure.  He has not been taking the Mevacor.  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  He is gaining weight.  He has been experiencing fleeting episodes of chest pain that have been related to exertion.  There has been no accompanying shortness of breath nausea or diaphoresis.   Review of Systems  Constitutional: Negative.  Negative for diaphoresis.  HENT: Negative.    Eyes:  Negative for blurred vision, discharge and redness.  Respiratory: Negative.  Negative for shortness of breath.   Cardiovascular:  Positive for chest pain.  Gastrointestinal:  Negative for abdominal pain and nausea.  Genitourinary: Negative.   Musculoskeletal: Negative.  Negative for myalgias.  Skin:  Negative for rash.  Neurological:  Negative for tingling, loss of consciousness and weakness.  Endo/Heme/Allergies:  Negative for polydipsia.     Current Outpatient Medications:    ACCU-CHEK GUIDE test strip, USE 2 STRIPS TO CHECK GLUCOSE TWICE DAILY, Disp: 100 each, Rfl: 0   blood glucose meter kit and supplies KIT, Use to check FSBS BID. (Dx: E11.29, E11.65, R80.9)., Disp: 1 each, Rfl: 0   EPINEPHrine (EPIPEN 2-PAK) 0.3  mg/0.3 mL IJ SOAJ injection, Inject 0.3 mg into the muscle as needed for anaphylaxis., Disp: 2 each, Rfl: 0   metFORMIN (GLUCOPHAGE-XR) 500 MG 24 hr tablet, Take 2 tablets (1,000 mg total) by mouth 2 (two) times daily with a meal., Disp: 360 tablet, Rfl: 1   amLODipine (NORVASC) 10 MG tablet, Take 1 tablet (10 mg total) by mouth daily., Disp: 90 tablet, Rfl: 1   lisinopril (ZESTRIL) 40 MG tablet, Take 1 tablet (40 mg total) by mouth daily., Disp: 90 tablet, Rfl: 3   lovastatin (MEVACOR) 20 MG tablet, Take 1 tablet (20 mg total) by mouth at bedtime., Disp: 90 tablet, Rfl: 3   triamterene-hydrochlorothiazide (MAXZIDE-25) 37.5-25 MG tablet, Take 1 tablet by mouth daily., Disp: 90 tablet, Rfl: 3   Objective:     BP (!) 174/104 (BP Location: Left Arm, Patient Position: Sitting, Cuff Size: Large)   Pulse 82   Temp 98.2 F (36.8 C) (Temporal)   Ht '6\' 1"'$  (1.854 m)   Wt 286 lb 3.2 oz (129.8 kg)   SpO2 96%   BMI 37.76 kg/m  BP Readings from Last 3 Encounters:  04/27/22 (!) 174/104  01/22/22 131/89  10/16/21 (!) 146/80   Wt Readings from Last 3 Encounters:  04/27/22 286 lb 3.2 oz (129.8 kg)  01/22/22 267 lb 3.2 oz (121.2 kg)  10/16/21 273 lb 3.2 oz (123.9 kg)      Physical Exam Constitutional:      General: He is  not in acute distress.    Appearance: Normal appearance. He is not ill-appearing, toxic-appearing or diaphoretic.  HENT:     Head: Normocephalic and atraumatic.     Right Ear: External ear normal.     Left Ear: External ear normal.  Eyes:     General: No scleral icterus.       Right eye: No discharge.        Left eye: No discharge.     Extraocular Movements: Extraocular movements intact.     Conjunctiva/sclera: Conjunctivae normal.  Cardiovascular:     Rate and Rhythm: Normal rate and regular rhythm.  Pulmonary:     Effort: Pulmonary effort is normal. No respiratory distress.     Breath sounds: Normal breath sounds.  Musculoskeletal:     Cervical back: No rigidity or  tenderness.  Skin:    General: Skin is warm and dry.  Neurological:     Mental Status: He is alert and oriented to person, place, and time.  Psychiatric:        Mood and Affect: Mood normal.        Behavior: Behavior normal.      No results found for any visits on 04/27/22.    The 10-year ASCVD risk score (Arnett DK, et al., 2019) is: 20.1%    Assessment & Plan:   Hyperlipidemia associated with type 2 diabetes mellitus (HCC) -     Lovastatin; Take 1 tablet (20 mg total) by mouth at bedtime.  Dispense: 90 tablet; Refill: 3 -     LDL cholesterol, direct  Essential hypertension -     Triamterene-HCTZ; Take 1 tablet by mouth daily.  Dispense: 90 tablet; Refill: 3 -     Lisinopril; Take 1 tablet (40 mg total) by mouth daily.  Dispense: 90 tablet; Refill: 3 -     amLODIPine Besylate; Take 1 tablet (10 mg total) by mouth daily.  Dispense: 90 tablet; Refill: 1 -     Basic metabolic panel  Type 2 diabetes mellitus without complication, without long-term current use of insulin (HCC) -     Lisinopril; Take 1 tablet (40 mg total) by mouth daily.  Dispense: 90 tablet; Refill: 3 -     metFORMIN HCl ER; Take 2 tablets (1,000 mg total) by mouth 2 (two) times daily with a meal.  Dispense: 360 tablet; Refill: 1 -     Hemoglobin A1c -     Basic metabolic panel  Chest pain, unspecified type -     Ambulatory referral to Cardiology    Return in about 6 weeks (around 06/08/2022).   Stressed the importance of compliance with all medicines.  He is considering a job change.  His chest pain is somewhat concerning.  Cardiology referral. Libby Maw, MD

## 2022-04-28 LAB — BASIC METABOLIC PANEL
BUN/Creatinine Ratio: 13 (ref 9–20)
BUN: 15 mg/dL (ref 6–24)
CO2: 18 mmol/L — ABNORMAL LOW (ref 20–29)
Calcium: 9.3 mg/dL (ref 8.7–10.2)
Chloride: 104 mmol/L (ref 96–106)
Creatinine, Ser: 1.16 mg/dL (ref 0.76–1.27)
Glucose: 181 mg/dL — ABNORMAL HIGH (ref 70–99)
Potassium: 3.7 mmol/L (ref 3.5–5.2)
Sodium: 141 mmol/L (ref 134–144)
eGFR: 80 mL/min/{1.73_m2} (ref >59)

## 2022-04-28 LAB — HEMOGLOBIN A1C
Est. average glucose Bld gHb Est-mCnc: 203 mg/dL
Hgb A1c MFr Bld: 8.7 % — ABNORMAL HIGH (ref 4.8–5.6)

## 2022-04-28 LAB — LDL CHOLESTEROL, DIRECT: LDL Direct: 109 mg/dL — ABNORMAL HIGH (ref 0–99)

## 2022-06-11 ENCOUNTER — Encounter: Payer: Self-pay | Admitting: Family Medicine

## 2022-06-11 ENCOUNTER — Ambulatory Visit (INDEPENDENT_AMBULATORY_CARE_PROVIDER_SITE_OTHER): Payer: Commercial Managed Care - HMO | Admitting: Family Medicine

## 2022-06-11 VITALS — BP 154/90 | HR 73 | Temp 98.1°F | Ht 73.0 in | Wt 280.5 lb

## 2022-06-11 DIAGNOSIS — J0110 Acute frontal sinusitis, unspecified: Secondary | ICD-10-CM

## 2022-06-11 DIAGNOSIS — I10 Essential (primary) hypertension: Secondary | ICD-10-CM | POA: Diagnosis not present

## 2022-06-11 DIAGNOSIS — E1169 Type 2 diabetes mellitus with other specified complication: Secondary | ICD-10-CM | POA: Diagnosis not present

## 2022-06-11 DIAGNOSIS — E119 Type 2 diabetes mellitus without complications: Secondary | ICD-10-CM

## 2022-06-11 DIAGNOSIS — E785 Hyperlipidemia, unspecified: Secondary | ICD-10-CM | POA: Diagnosis not present

## 2022-06-11 HISTORY — DX: Acute frontal sinusitis, unspecified: J01.10

## 2022-06-11 MED ORDER — LOVASTATIN 20 MG PO TABS: 20.0000 mg | ORAL_TABLET | Freq: Every day | ORAL | 3 refills | Status: DC

## 2022-06-11 MED ORDER — AMLODIPINE BESYLATE 10 MG PO TABS: 10.0000 mg | ORAL_TABLET | Freq: Every day | ORAL | 1 refills | Status: DC

## 2022-06-11 MED ORDER — METFORMIN HCL ER 500 MG PO TB24: 1000.0000 mg | ORAL_TABLET | Freq: Two times a day (BID) | ORAL | 1 refills | Status: DC

## 2022-06-11 MED ORDER — AZITHROMYCIN 250 MG PO TABS: ORAL_TABLET | ORAL | 0 refills | Status: AC

## 2022-06-11 MED ORDER — LISINOPRIL 40 MG PO TABS: 40.0000 mg | ORAL_TABLET | Freq: Every day | ORAL | 3 refills | Status: DC

## 2022-06-11 MED ORDER — TRIAMTERENE-HCTZ 37.5-25 MG PO TABS: 1.0000 | ORAL_TABLET | Freq: Every day | ORAL | 3 refills | Status: DC

## 2022-06-11 NOTE — Progress Notes (Signed)
Established Patient Office Visit   Subjective:  Patient ID: Darren Little, male    DOB: 1980/09/07  Age: 42 y.o. MRN: [id number]  Chief Complaint  Patient presents with   Medical Management of Chronic Issues    Headache, sinus infection(been getting little better), drainage( yellowish green). Been taking tylenol for it which has not been helping     HPI Encounter Diagnoses  Name Primary?   Hypertension not at goal Yes   Essential hypertension    Type 2 diabetes mellitus without complication, without long-term current use of insulin (HCC)    Hyperlipidemia associated with type 2 diabetes mellitus ([place])    Acute frontal sinusitis, recurrence not specified    5-day history of URI signs and symptoms with frontal headache and clear to purulent rhinorrhea.  Assures compliance with amlodipine 10 mg lisinopril 40 mg and Maxide.  Has been able to tolerate Mevacor without issue.  Assures compliance with metformin 1000 twice daily.   Review of Systems  Constitutional: Negative.   HENT:  Positive for congestion and sinus pain.   Eyes:  Negative for blurred vision, discharge and redness.  Respiratory: Negative.    Cardiovascular: Negative.   Gastrointestinal:  Negative for abdominal pain.  Genitourinary: Negative.   Musculoskeletal: Negative.  Negative for myalgias.  Skin:  Negative for rash.  Neurological:  Negative for tingling, loss of consciousness and weakness.  Endo/Heme/Allergies:  Negative for polydipsia.     Current Outpatient Medications:    ACCU-CHEK GUIDE test strip, USE 2 STRIPS TO CHECK GLUCOSE TWICE DAILY, Disp: 100 each, Rfl: 0   azithromycin (ZITHROMAX) 250 MG tablet, Take 2 tablets on day 1, then 1 tablet daily on days 2 through 5, Disp: 6 tablet, Rfl: 0   blood glucose meter kit and supplies KIT, Use to check FSBS BID. (Dx: E11.29, E11.65, R80.9)., Disp: 1 each, Rfl: 0   EPINEPHrine (EPIPEN 2-PAK) 0.3 mg/0.3 mL IJ SOAJ injection, Inject 0.3 mg into the muscle as  needed for anaphylaxis., Disp: 2 each, Rfl: 0   amLODipine (NORVASC) 10 MG tablet, Take 1 tablet (10 mg total) by mouth daily., Disp: 90 tablet, Rfl: 1   lisinopril (ZESTRIL) 40 MG tablet, Take 1 tablet (40 mg total) by mouth daily., Disp: 90 tablet, Rfl: 3   lovastatin (MEVACOR) 20 MG tablet, Take 1 tablet (20 mg total) by mouth at bedtime., Disp: 90 tablet, Rfl: 3   metFORMIN (GLUCOPHAGE-XR) 500 MG 24 hr tablet, Take 2 tablets (1,000 mg total) by mouth 2 (two) times daily with a meal., Disp: 360 tablet, Rfl: 1   triamterene-hydrochlorothiazide (MAXZIDE-25) 37.5-25 MG tablet, Take 1 tablet by mouth daily., Disp: 90 tablet, Rfl: 3   Objective:     BP (!) 154/90   Pulse 73   Temp 98.1 F (36.7 C) (Oral)   Ht '6\' 1"'$  (1.854 m)   Wt 280 lb 8 oz (127.2 kg)   SpO2 95%   BMI 37.01 kg/m    Physical Exam Constitutional:      General: He is not in acute distress.    Appearance: Normal appearance. He is not ill-appearing, toxic-appearing or diaphoretic.  HENT:     Head: Normocephalic and atraumatic.     Right Ear: External ear normal.     Left Ear: External ear normal.  Eyes:     General: No scleral icterus.       Right eye: No discharge.        Left eye: No discharge.  Extraocular Movements: Extraocular movements intact.     Conjunctiva/sclera: Conjunctivae normal.     Pupils: Pupils are equal, round, and reactive to light.  Cardiovascular:     Rate and Rhythm: Normal rate and regular rhythm.  Pulmonary:     Effort: Pulmonary effort is normal. No respiratory distress.     Breath sounds: Normal breath sounds.  Abdominal:     General: Bowel sounds are normal.  Musculoskeletal:     Cervical back: No rigidity or tenderness.  Skin:    General: Skin is warm and dry.  Neurological:     Mental Status: He is alert and oriented to person, place, and time.  Psychiatric:        Mood and Affect: Mood normal.        Behavior: Behavior normal.      No results found for any visits on  06/11/22.    The 10-year ASCVD risk score (Arnett DK, et al., 2019) is: 16.3%    Assessment & Plan:   Hypertension not at goal -     Ambulatory referral to Cardiology  Essential hypertension -     amLODIPine Besylate; Take 1 tablet (10 mg total) by mouth daily.  Dispense: 90 tablet; Refill: 1 -     Lisinopril; Take 1 tablet (40 mg total) by mouth daily.  Dispense: 90 tablet; Refill: 3 -     Triamterene-HCTZ; Take 1 tablet by mouth daily.  Dispense: 90 tablet; Refill: 3  Type 2 diabetes mellitus without complication, without long-term current use of insulin (HCC) -     Lisinopril; Take 1 tablet (40 mg total) by mouth daily.  Dispense: 90 tablet; Refill: 3 -     metFORMIN HCl ER; Take 2 tablets (1,000 mg total) by mouth 2 (two) times daily with a meal.  Dispense: 360 tablet; Refill: 1  Hyperlipidemia associated with type 2 diabetes mellitus (HCC) -     Lovastatin; Take 1 tablet (20 mg total) by mouth at bedtime.  Dispense: 90 tablet; Refill: 3  Acute frontal sinusitis, recurrence not specified -     Azithromycin; Take 2 tablets on day 1, then 1 tablet daily on days 2 through 5  Dispense: 6 tablet; Refill: 0    Return in about 2 months (around 08/11/2022).  Frontal headache secondary to sinusitis versus hypertension not at goal.  Prescription for Zithromax written essentially for its anti-inflammatory properties.  Continue all 3 medications for hypertension.  Cardiology referral for management.  Continue metformin 1000 mg twice daily and lovastatin.  Libby Maw, MD

## 2022-07-09 ENCOUNTER — Ambulatory Visit: Payer: Managed Care, Other (non HMO) | Attending: Cardiology | Admitting: Cardiology

## 2022-07-13 ENCOUNTER — Other Ambulatory Visit: Payer: Self-pay | Admitting: Family Medicine

## 2022-07-13 DIAGNOSIS — Z91038 Other insect allergy status: Secondary | ICD-10-CM

## 2022-07-16 ENCOUNTER — Encounter: Payer: Self-pay | Admitting: *Deleted

## 2022-08-13 ENCOUNTER — Telehealth: Payer: Self-pay | Admitting: Family Medicine

## 2022-08-13 ENCOUNTER — Ambulatory Visit: Payer: Managed Care, Other (non HMO) | Admitting: Family Medicine

## 2022-08-13 NOTE — Telephone Encounter (Signed)
Pt was a no show for an OV with Dr Doreene Burke for 08/13/22, I sent a no show letter.

## 2022-08-24 NOTE — Telephone Encounter (Signed)
1st no show, fee waived, letter sent and text

## 2022-09-07 DIAGNOSIS — Z9289 Personal history of other medical treatment: Secondary | ICD-10-CM | POA: Insufficient documentation

## 2022-09-07 DIAGNOSIS — I10 Essential (primary) hypertension: Secondary | ICD-10-CM | POA: Insufficient documentation

## 2022-09-07 DIAGNOSIS — R718 Other abnormality of red blood cells: Secondary | ICD-10-CM | POA: Insufficient documentation

## 2022-09-21 ENCOUNTER — Ambulatory Visit: Payer: Managed Care, Other (non HMO) | Attending: Cardiology | Admitting: Cardiology

## 2023-03-30 ENCOUNTER — Other Ambulatory Visit: Payer: Self-pay | Admitting: Family Medicine

## 2023-03-30 DIAGNOSIS — I10 Essential (primary) hypertension: Secondary | ICD-10-CM

## 2023-08-05 ENCOUNTER — Encounter: Payer: Self-pay | Admitting: Family Medicine

## 2023-08-05 ENCOUNTER — Ambulatory Visit (INDEPENDENT_AMBULATORY_CARE_PROVIDER_SITE_OTHER): Admitting: Family Medicine

## 2023-08-05 VITALS — BP 164/90 | HR 87 | Temp 98.7°F | Ht 73.0 in | Wt 275.4 lb

## 2023-08-05 DIAGNOSIS — R809 Proteinuria, unspecified: Secondary | ICD-10-CM

## 2023-08-05 DIAGNOSIS — Z91199 Patient's noncompliance with other medical treatment and regimen due to unspecified reason: Secondary | ICD-10-CM

## 2023-08-05 DIAGNOSIS — Z7984 Long term (current) use of oral hypoglycemic drugs: Secondary | ICD-10-CM

## 2023-08-05 DIAGNOSIS — E1169 Type 2 diabetes mellitus with other specified complication: Secondary | ICD-10-CM | POA: Diagnosis not present

## 2023-08-05 DIAGNOSIS — E1165 Type 2 diabetes mellitus with hyperglycemia: Secondary | ICD-10-CM

## 2023-08-05 DIAGNOSIS — I10 Essential (primary) hypertension: Secondary | ICD-10-CM | POA: Diagnosis not present

## 2023-08-05 DIAGNOSIS — E785 Hyperlipidemia, unspecified: Secondary | ICD-10-CM | POA: Diagnosis not present

## 2023-08-05 MED ORDER — METFORMIN HCL ER 500 MG PO TB24: 1000.0000 mg | ORAL_TABLET | Freq: Two times a day (BID) | ORAL | 0 refills | Status: DC

## 2023-08-05 MED ORDER — LOVASTATIN 20 MG PO TABS: 20.0000 mg | ORAL_TABLET | Freq: Every day | ORAL | 0 refills | Status: DC

## 2023-08-05 MED ORDER — AMLODIPINE BESYLATE 10 MG PO TABS: 10.0000 mg | ORAL_TABLET | Freq: Every day | ORAL | 0 refills | Status: DC

## 2023-08-05 MED ORDER — TRIAMTERENE-HCTZ 37.5-25 MG PO TABS: 1.0000 | ORAL_TABLET | Freq: Every day | ORAL | 0 refills | Status: DC

## 2023-08-05 NOTE — Progress Notes (Signed)
 Established Patient Office Visit   Subjective:  Patient ID: Darren Little, male    DOB: [birth year]  Age: 43 y.o. MRN: [id number]  Chief Complaint  Patient presents with   Annual Exam    CPE.     HPI Encounter Diagnoses  Name Primary?   Uncontrolled type 2 diabetes mellitus with hyperglycemia (HCC) Yes   Hypertension not at goal    Hyperlipidemia associated with type 2 diabetes mellitus (HCC)    Non-compliant patient    For follow-up of above.  Was seen in March of last year and asked to return in 2 months.  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  He has been out of his medications.   Review of Systems  Constitutional: Negative.   HENT: Negative.    Eyes:  Negative for blurred vision, discharge and redness.  Respiratory: Negative.    Cardiovascular: Negative.   Gastrointestinal:  Negative for abdominal pain.  Genitourinary: Negative.   Musculoskeletal: Negative.  Negative for myalgias.  Skin:  Negative for rash.  Neurological:  Negative for tingling, loss of consciousness and weakness.  Endo/Heme/Allergies:  Negative for polydipsia.     Current Outpatient Medications:    ACCU-CHEK GUIDE test strip, USE 2 STRIPS TO CHECK GLUCOSE TWICE DAILY, Disp: 100 each, Rfl: 0   blood glucose meter kit and supplies KIT, Use to check FSBS BID. (Dx: E11.29, E11.65, R80.9)., Disp: 1 each, Rfl: 0   EPINEPHRINE  0.3 mg/0.3 mL IJ SOAJ injection, INJECT 0.3MG  INTO THE MUSCLE AS NEEDED FOR ANAPHYLAXIS, Disp: 2 each, Rfl: 0   amLODipine  (NORVASC ) 10 MG tablet, Take 1 tablet (10 mg total) by mouth daily., Disp: 90 tablet, Rfl: 0   lovastatin  (MEVACOR ) 20 MG tablet, Take 1 tablet (20 mg total) by mouth at bedtime., Disp: 90 tablet, Rfl: 0   metFORMIN  (GLUCOPHAGE -XR) 500 MG 24 hr tablet, Take 2 tablets (1,000 mg total) by mouth 2 (two) times daily with a meal., Disp: 360 tablet, Rfl: 0   triamterene -hydrochlorothiazide  (MAXZIDE-25) 37.5-25 MG tablet, Take 1 tablet by mouth  daily., Disp: 90 tablet, Rfl: 0   Objective:     BP (!) 164/90 (BP Location: Right Arm, Patient Position: Sitting, Cuff Size: Large)   Pulse 87   Temp 98.7 F (37.1 C) (Temporal)   Ht 6\' 1"  (1.854 m)   Wt 275 lb 6.4 oz (124.9 kg)   SpO2 96%   BMI 36.33 kg/m  BP Readings from Last 3 Encounters:  08/05/23 (!) 164/90  06/11/22 (!) 154/90  04/27/22 (!) 174/104   Wt Readings from Last 3 Encounters:  08/05/23 275 lb 6.4 oz (124.9 kg)  06/11/22 280 lb 8 oz (127.2 kg)  04/27/22 286 lb 3.2 oz (129.8 kg)      Physical Exam Constitutional:      General: He is not in acute distress.    Appearance: Normal appearance. He is not ill-appearing, toxic-appearing or diaphoretic.  HENT:     Head: Normocephalic and atraumatic.     Right Ear: External ear normal.     Left Ear: External ear normal.     Mouth/Throat:     Mouth: Mucous membranes are moist.     Pharynx: Oropharynx is clear. No oropharyngeal exudate or posterior oropharyngeal erythema.  Eyes:     General: No scleral icterus.       Right eye: No discharge.        Left eye: No discharge.     Extraocular  Movements: Extraocular movements intact.     Conjunctiva/sclera: Conjunctivae normal.     Pupils: Pupils are equal, round, and reactive to light.  Cardiovascular:     Rate and Rhythm: Normal rate and regular rhythm.  Pulmonary:     Effort: Pulmonary effort is normal. No respiratory distress.     Breath sounds: Normal breath sounds. No wheezing, rhonchi or rales.  Musculoskeletal:     Cervical back: No rigidity or tenderness.  Lymphadenopathy:     Cervical: No cervical adenopathy.  Skin:    General: Skin is warm and dry.  Neurological:     Mental Status: He is alert and oriented to person, place, and time.  Psychiatric:        Mood and Affect: Mood normal.        Behavior: Behavior normal.      No results found for any visits on 08/05/23.    The 10-year ASCVD risk score (Arnett DK, et al., 2019) is: 20.1%     Assessment & Plan:   Uncontrolled type 2 diabetes mellitus with hyperglycemia (HCC) -     metFORMIN  HCl ER; Take 2 tablets (1,000 mg total) by mouth 2 (two) times daily with a meal.  Dispense: 360 tablet; Refill: 0 -     Comprehensive metabolic panel with GFR -     Hemoglobin A1c -     Microalbumin / creatinine urine ratio  Hypertension not at goal -     amLODIPine  Besylate; Take 1 tablet (10 mg total) by mouth daily.  Dispense: 90 tablet; Refill: 0 -     Triamterene -HCTZ; Take 1 tablet by mouth daily.  Dispense: 90 tablet; Refill: 0 -     CBC -     Comprehensive metabolic panel with GFR -     Urinalysis, Routine w reflex microscopic  Hyperlipidemia associated with type 2 diabetes mellitus (HCC) -     Comprehensive metabolic panel with GFR -     Lipid panel -     Urinalysis, Routine w reflex microscopic -     Lovastatin ; Take 1 tablet (20 mg total) by mouth at bedtime.  Dispense: 90 tablet; Refill: 0  Non-compliant patient    Return Follow-up in 6 weeks and in 3 months..  Follow-up in 6 weeks for recheck of blood pressure and in 3 months for follow-up of cholesterol and diabetes control.  Advised patient that his lack of follow-up prevents him from having the best care possible and could jeopardize his long-term health.  Tonna Frederic, MD

## 2023-08-06 DIAGNOSIS — R809 Proteinuria, unspecified: Secondary | ICD-10-CM | POA: Insufficient documentation

## 2023-08-06 LAB — CBC
HCT: 40.9 % (ref 39.0–52.0)
Hemoglobin: 12.9 g/dL — ABNORMAL LOW (ref 13.0–17.0)
MCHC: 31.5 g/dL (ref 30.0–36.0)
MCV: 64.3 fl — ABNORMAL LOW (ref 78.0–100.0)
Platelets: 228 10*3/uL (ref 150.0–400.0)
RBC: 6.37 Mil/uL — ABNORMAL HIGH (ref 4.22–5.81)
RDW: 17 % — ABNORMAL HIGH (ref 11.5–15.5)
WBC: 4.3 10*3/uL (ref 4.0–10.5)

## 2023-08-06 LAB — URINALYSIS, ROUTINE W REFLEX MICROSCOPIC
Bilirubin Urine: NEGATIVE
Hgb urine dipstick: NEGATIVE
Ketones, ur: NEGATIVE
Leukocytes,Ua: NEGATIVE
Nitrite: NEGATIVE
RBC / HPF: NONE SEEN (ref 0–?)
Specific Gravity, Urine: >=1.03 — AB (ref 1.000–1.030)
Total Protein, Urine: >=300 — AB
Urine Glucose: 100 — AB
Urobilinogen, UA: 1 (ref 0.0–1.0)
pH: 6 (ref 5.0–8.0)

## 2023-08-06 LAB — LIPID PANEL
Cholesterol: 200 mg/dL (ref 0–200)
HDL: 33.8 mg/dL — ABNORMAL LOW (ref >39.00)
LDL Cholesterol: 149 mg/dL — ABNORMAL HIGH (ref 0–99)
NonHDL: 166.34
Total CHOL/HDL Ratio: 6
Triglycerides: 85 mg/dL (ref 0.0–149.0)
VLDL: 17 mg/dL (ref 0.0–40.0)

## 2023-08-06 LAB — MICROALBUMIN / CREATININE URINE RATIO
Creatinine,U: 283.3 mg/dL
Microalb Creat Ratio: 364.9 mg/g — ABNORMAL HIGH (ref 0.0–30.0)
Microalb, Ur: 103.4 mg/dL — ABNORMAL HIGH (ref 0.0–1.9)

## 2023-08-06 LAB — COMPREHENSIVE METABOLIC PANEL WITH GFR
ALT: 11 U/L (ref 0–53)
AST: 21 U/L (ref 0–37)
Albumin: 3.9 g/dL (ref 3.5–5.2)
Alkaline Phosphatase: 107 U/L (ref 39–117)
BUN: 12 mg/dL (ref 6–23)
CO2: 28 meq/L (ref 19–32)
Calcium: 8.9 mg/dL (ref 8.4–10.5)
Chloride: 102 meq/L (ref 96–112)
Creatinine, Ser: 1.3 mg/dL (ref 0.40–1.50)
GFR: 66.56 mL/min (ref >60.00)
Glucose, Bld: 198 mg/dL — ABNORMAL HIGH (ref 70–99)
Potassium: 3.9 meq/L (ref 3.5–5.1)
Sodium: 138 meq/L (ref 135–145)
Total Bilirubin: 0.7 mg/dL (ref 0.2–1.2)
Total Protein: 7.1 g/dL (ref 6.0–8.3)

## 2023-08-06 LAB — HEMOGLOBIN A1C: Hgb A1c MFr Bld: 11.4 % — ABNORMAL HIGH (ref 4.6–6.5)

## 2023-09-16 ENCOUNTER — Ambulatory Visit: Admitting: Family Medicine

## 2023-09-16 ENCOUNTER — Ambulatory Visit (INDEPENDENT_AMBULATORY_CARE_PROVIDER_SITE_OTHER): Admitting: Family Medicine

## 2023-09-16 ENCOUNTER — Encounter: Payer: Self-pay | Admitting: Family Medicine

## 2023-09-16 VITALS — BP 140/100 | HR 96 | Temp 97.3°F | Ht 73.0 in | Wt 272.2 lb

## 2023-09-16 DIAGNOSIS — M2550 Pain in unspecified joint: Secondary | ICD-10-CM

## 2023-09-16 DIAGNOSIS — E1165 Type 2 diabetes mellitus with hyperglycemia: Secondary | ICD-10-CM

## 2023-09-16 DIAGNOSIS — Z91038 Other insect allergy status: Secondary | ICD-10-CM | POA: Diagnosis not present

## 2023-09-16 DIAGNOSIS — I10 Essential (primary) hypertension: Secondary | ICD-10-CM

## 2023-09-16 MED ORDER — TRIAMTERENE-HCTZ 37.5-25 MG PO TABS: 1.0000 | ORAL_TABLET | Freq: Every day | ORAL | 0 refills | Status: DC

## 2023-09-16 MED ORDER — EPINEPHRINE 0.3 MG/0.3ML IJ SOAJ: 0.3000 mg | INTRAMUSCULAR | 2 refills | Status: AC | PRN

## 2023-09-16 MED ORDER — DICLOFENAC SODIUM 1 % EX GEL: CUTANEOUS | 2 refills | Status: AC

## 2023-09-16 MED ORDER — AMLODIPINE BESYLATE 10 MG PO TABS: 10.0000 mg | ORAL_TABLET | Freq: Every day | ORAL | 0 refills | Status: DC

## 2023-09-16 MED ORDER — ACCU-CHEK GUIDE TEST VI STRP: ORAL_STRIP | 12 refills | Status: DC

## 2023-09-16 MED ORDER — AMLODIPINE BESYLATE 10 MG PO TABS
10.0000 mg | ORAL_TABLET | Freq: Every day | ORAL | 0 refills | Status: DC
Start: 2023-09-16 — End: 2023-09-16

## 2023-09-16 MED ORDER — ACCU-CHEK SOFTCLIX LANCETS MISC: 12 refills | Status: DC

## 2023-09-16 MED ORDER — OLMESARTAN MEDOXOMIL 20 MG PO TABS: 20.0000 mg | ORAL_TABLET | Freq: Every day | ORAL | 1 refills | Status: DC

## 2023-09-16 NOTE — Progress Notes (Signed)
 Established Patient Office Visit   Subjective:  Patient ID: Darren Little, male    DOB: [birth year]  Age: 43 y.o. MRN: [id number]  Chief Complaint  Patient presents with   Diabetes Management Plan    No concerns besides not sleeping at night patient also is fasting    HPI Encounter Diagnoses  Name Primary?   Hypertension not at goal Yes   Hymenoptera allergy    Uncontrolled type 2 diabetes mellitus with hyperglycemia (HCC)    Arthralgia, unspecified joint    Back on all of his medications and is feeling better.  No longer having headaches.  Admits that he had gotten to the point before where he was not watching what he was eating and was not taking his medications regularly.   Review of Systems  Constitutional: Negative.   HENT: Negative.    Eyes:  Negative for blurred vision, discharge and redness.  Respiratory: Negative.    Cardiovascular: Negative.   Gastrointestinal:  Negative for abdominal pain.  Genitourinary: Negative.   Musculoskeletal: Negative.  Negative for myalgias.  Skin:  Negative for rash.  Neurological:  Negative for tingling, loss of consciousness and weakness.  Endo/Heme/Allergies:  Negative for polydipsia.     Current Outpatient Medications:    ACCU-CHEK GUIDE test strip, USE 2 STRIPS TO CHECK GLUCOSE TWICE DAILY, Disp: 100 each, Rfl: 0   Accu-Chek Softclix Lancets lancets, Use as instructed, Disp: 100 each, Rfl: 12   blood glucose meter kit and supplies KIT, Use to check FSBS BID. (Dx: E11.29, E11.65, R80.9)., Disp: 1 each, Rfl: 0   diclofenac  Sodium (VOLTAREN ) 1 % GEL, Apply a small grape sized dollop on sore joint up to 4 times a day as needed., Disp: 150 g, Rfl: 2   glucose blood (ACCU-CHEK GUIDE TEST) test strip, Use as instructed, Disp: 50 each, Rfl: 12   lovastatin  (MEVACOR ) 20 MG tablet, Take 1 tablet (20 mg total) by mouth at bedtime., Disp: 90 tablet, Rfl: 0   metFORMIN  (GLUCOPHAGE -XR) 500 MG 24 hr tablet, Take 2 tablets (1,000 mg total)  by mouth 2 (two) times daily with a meal., Disp: 360 tablet, Rfl: 0   olmesartan (BENICAR) 20 MG tablet, Take 1 tablet (20 mg total) by mouth daily., Disp: 90 tablet, Rfl: 1   amLODipine  (NORVASC ) 10 MG tablet, Take 1 tablet (10 mg total) by mouth daily., Disp: 90 tablet, Rfl: 0   EPINEPHrine  0.3 mg/0.3 mL IJ SOAJ injection, Inject 0.3 mg into the muscle as needed for anaphylaxis., Disp: 2 each, Rfl: 2   triamterene -hydrochlorothiazide  (MAXZIDE-25) 37.5-25 MG tablet, Take 1 tablet by mouth daily., Disp: 90 each, Rfl: 0   Objective:     BP (!) 140/100 (BP Location: Left Arm, Patient Position: Sitting, Cuff Size: Normal)   Pulse 96   Temp (!) 97.3 F (36.3 C) (Temporal)   Ht 6' 1 (1.854 m)   Wt 272 lb 3.2 oz (123.5 kg)   SpO2 99%   BMI 35.91 kg/m  BP Readings from Last 3 Encounters:  09/16/23 (!) 140/100  08/05/23 (!) 164/90  06/11/22 (!) 154/90   Wt Readings from Last 3 Encounters:  09/16/23 272 lb 3.2 oz (123.5 kg)  08/05/23 275 lb 6.4 oz (124.9 kg)  06/11/22 280 lb 8 oz (127.2 kg)      Physical Exam Constitutional:      General: He is not in acute distress.    Appearance: Normal appearance. He is not ill-appearing, toxic-appearing or diaphoretic.  HENT:  Head: Normocephalic and atraumatic.     Right Ear: External ear normal.     Left Ear: External ear normal.   Eyes:     General: No scleral icterus.       Right eye: No discharge.        Left eye: No discharge.     Extraocular Movements: Extraocular movements intact.     Conjunctiva/sclera: Conjunctivae normal.   Pulmonary:     Effort: Pulmonary effort is normal. No respiratory distress.   Skin:    General: Skin is warm and dry.   Neurological:     Mental Status: He is alert and oriented to person, place, and time.   Psychiatric:        Mood and Affect: Mood normal.        Behavior: Behavior normal.      No results found for any visits on 09/16/23.    The 10-year ASCVD risk score (Arnett DK, et  al., 2019) is: 16.5%    Assessment & Plan:   Hypertension not at goal -     Triamterene -HCTZ; Take 1 tablet by mouth daily.  Dispense: 90 each; Refill: 0 -     Olmesartan Medoxomil; Take 1 tablet (20 mg total) by mouth daily.  Dispense: 90 tablet; Refill: 1 -     amLODIPine  Besylate; Take 1 tablet (10 mg total) by mouth daily.  Dispense: 90 tablet; Refill: 0  Hymenoptera allergy -     EPINEPHrine ; Inject 0.3 mg into the muscle as needed for anaphylaxis.  Dispense: 2 each; Refill: 2  Uncontrolled type 2 diabetes mellitus with hyperglycemia (HCC) -     Accu-Chek Guide Test; Use as instructed  Dispense: 50 each; Refill: 12 -     Accu-Chek Softclix Lancets; Use as instructed  Dispense: 100 each; Refill: 12  Arthralgia, unspecified joint -     Diclofenac  Sodium; Apply a small grape sized dollop on sore joint up to 4 times a day as needed.  Dispense: 150 g; Refill: 2  Arthralgias   Return in about 8 weeks (around 11/11/2023).  Continue with amlodipine  and triamterene  with HCTZ.  Added olmesartan.  Advised him to be mindful of sodium intake.  Continue medications for diabetes.  Follow-up in 8 weeks.  Recheck of diabetes and electrolytes.  Tonna Frederic, MD

## 2023-11-18 ENCOUNTER — Encounter: Payer: Self-pay | Admitting: Family Medicine

## 2023-11-18 ENCOUNTER — Ambulatory Visit: Admitting: Family Medicine

## 2023-11-18 VITALS — BP 166/102 | HR 86 | Temp 97.4°F | Ht 73.0 in | Wt 270.6 lb

## 2023-11-18 DIAGNOSIS — E785 Hyperlipidemia, unspecified: Secondary | ICD-10-CM | POA: Diagnosis not present

## 2023-11-18 DIAGNOSIS — I10 Essential (primary) hypertension: Secondary | ICD-10-CM

## 2023-11-18 DIAGNOSIS — E1165 Type 2 diabetes mellitus with hyperglycemia: Secondary | ICD-10-CM | POA: Diagnosis not present

## 2023-11-18 DIAGNOSIS — E1169 Type 2 diabetes mellitus with other specified complication: Secondary | ICD-10-CM

## 2023-11-18 MED ORDER — OLMESARTAN MEDOXOMIL 40 MG PO TABS: 40.0000 mg | ORAL_TABLET | Freq: Every day | ORAL | 0 refills | Status: DC

## 2023-11-18 MED ORDER — AMLODIPINE BESYLATE 10 MG PO TABS: 10.0000 mg | ORAL_TABLET | Freq: Every day | ORAL | 0 refills | Status: DC

## 2023-11-18 MED ORDER — TRIAMTERENE-HCTZ 37.5-25 MG PO TABS
1.0000 | ORAL_TABLET | Freq: Every day | ORAL | 0 refills | Status: DC
Start: 2023-11-18 — End: 2024-02-10

## 2023-11-18 NOTE — Progress Notes (Signed)
 Established Patient Office Visit   Subjective:  Patient ID: Darren Little, male    DOB: 1980-12-02  Age: 43 y.o. MRN: [id number]  Chief Complaint  Patient presents with   Medical Management of Chronic Issues    8 week follow up.     HPI Encounter Diagnoses  Name Primary?   Hypertension not at goal Yes   Uncontrolled type 2 diabetes mellitus with hyperglycemia (HCC)    Hyperlipidemia associated with type 2 diabetes mellitus (HCC)    For follow-up of above.  Seen 8 weeks ago when olmesartan  20 mg daily was added to amlodipine  10 mg and triamterene  daily.  Patient reports compliance with all medications.  Denies tobacco use, illicit drug use or alcohol.  Denies increased salt load.  Claims compliance with metformin .   Review of Systems  Constitutional: Negative.   HENT: Negative.    Eyes:  Negative for blurred vision, discharge and redness.  Respiratory: Negative.    Cardiovascular: Negative.   Gastrointestinal:  Negative for abdominal pain.  Genitourinary: Negative.   Musculoskeletal: Negative.  Negative for myalgias.  Skin:  Negative for rash.  Neurological:  Negative for tingling, loss of consciousness and weakness.  Endo/Heme/Allergies:  Negative for polydipsia.     Current Outpatient Medications:    ACCU-CHEK GUIDE test strip, USE 2 STRIPS TO CHECK GLUCOSE TWICE DAILY, Disp: 100 each, Rfl: 0   Accu-Chek Softclix Lancets lancets, Use as instructed, Disp: 100 each, Rfl: 12   blood glucose meter kit and supplies KIT, Use to check FSBS BID. (Dx: E11.29, E11.65, R80.9)., Disp: 1 each, Rfl: 0   diclofenac  Sodium (VOLTAREN ) 1 % GEL, Apply a small grape sized dollop on sore joint up to 4 times a day as needed., Disp: 150 g, Rfl: 2   EPINEPHrine  0.3 mg/0.3 mL IJ SOAJ injection, Inject 0.3 mg into the muscle as needed for anaphylaxis., Disp: 2 each, Rfl: 2   glucose blood (ACCU-CHEK GUIDE TEST) test strip, Use as instructed, Disp: 50 each, Rfl: 12   lovastatin  (MEVACOR ) 20 MG  tablet, Take 1 tablet (20 mg total) by mouth at bedtime., Disp: 90 tablet, Rfl: 0   metFORMIN  (GLUCOPHAGE -XR) 500 MG 24 hr tablet, Take 2 tablets (1,000 mg total) by mouth 2 (two) times daily with a meal., Disp: 360 tablet, Rfl: 0   olmesartan  (BENICAR ) 40 MG tablet, Take 1 tablet (40 mg total) by mouth daily., Disp: 90 tablet, Rfl: 0   amLODipine  (NORVASC ) 10 MG tablet, Take 1 tablet (10 mg total) by mouth daily., Disp: 90 each, Rfl: 0   triamterene -hydrochlorothiazide  (MAXZIDE-25) 37.5-25 MG tablet, Take 1 tablet by mouth daily., Disp: 90 each, Rfl: 0   Objective:     BP (!) 166/102 (BP Location: Left Arm, Patient Position: Sitting, Cuff Size: Large)   Pulse 86   Temp (!) 97.4 F (36.3 C) (Temporal)   Ht 6' 1 (1.854 m)   Wt 270 lb 9.6 oz (122.7 kg)   SpO2 97%   BMI 35.70 kg/m  BP Readings from Last 3 Encounters:  11/18/23 (!) 166/102  09/16/23 (!) 140/100  08/05/23 (!) 164/90   Wt Readings from Last 3 Encounters:  11/18/23 270 lb 9.6 oz (122.7 kg)  09/16/23 272 lb 3.2 oz (123.5 kg)  08/05/23 275 lb 6.4 oz (124.9 kg)      Physical Exam Constitutional:      General: He is not in acute distress.    Appearance: Normal appearance. He is not ill-appearing, toxic-appearing or diaphoretic.  HENT:     Head: Normocephalic and atraumatic.     Right Ear: External ear normal.     Left Ear: External ear normal.  Eyes:     General: No scleral icterus.       Right eye: No discharge.        Left eye: No discharge.     Extraocular Movements: Extraocular movements intact.     Conjunctiva/sclera: Conjunctivae normal.  Pulmonary:     Effort: Pulmonary effort is normal. No respiratory distress.  Skin:    General: Skin is warm and dry.  Neurological:     Mental Status: He is alert and oriented to person, place, and time.  Psychiatric:        Mood and Affect: Mood normal.        Behavior: Behavior normal.      No results found for any visits on 11/18/23.    The 10-year ASCVD  risk score (Arnett DK, et al., 2019) is: 22.1%    Assessment & Plan:   Hypertension not at goal -     amLODIPine  Besylate; Take 1 tablet (10 mg total) by mouth daily.  Dispense: 90 each; Refill: 0 -     Triamterene -HCTZ; Take 1 tablet by mouth daily.  Dispense: 90 each; Refill: 0 -     Basic metabolic panel with GFR -     Aldosterone + renin activity w/ ratio -     Olmesartan  Medoxomil; Take 1 tablet (40 mg total) by mouth daily.  Dispense: 90 tablet; Refill: 0 -     Ambulatory referral to Cardiology -     ECHOCARDIOGRAM COMPLETE; Future -     US  RENAL ARTERY DUPLEX COMPLETE; Future  Uncontrolled type 2 diabetes mellitus with hyperglycemia (HCC) -     Basic metabolic panel with GFR -     Hemoglobin A1c  Hyperlipidemia associated with type 2 diabetes mellitus (HCC) -     LDL cholesterol, direct    Return in about 6 weeks (around 12/30/2023).  Continue amlodipine  10 mg and triamterene .  Increased olmesartan  to 40 mg daily.  Cardiology referral.  Will send for echocardiogram and ultrasound study of renal arteries.  Checking aldosterone and renin activity.  Rechecking A1c after restarting patient on metformin .  Elsie Sim Lent, MD

## 2023-11-19 ENCOUNTER — Ambulatory Visit: Payer: Self-pay | Admitting: Family Medicine

## 2023-11-19 LAB — LDL CHOLESTEROL, DIRECT: Direct LDL: 135 mg/dL

## 2023-11-19 LAB — BASIC METABOLIC PANEL WITH GFR
BUN: 13 mg/dL (ref 6–23)
CO2: 28 meq/L (ref 19–32)
Calcium: 8.8 mg/dL (ref 8.4–10.5)
Chloride: 101 meq/L (ref 96–112)
Creatinine, Ser: 1.28 mg/dL (ref 0.40–1.50)
GFR: 67.67 mL/min
Glucose, Bld: 168 mg/dL — ABNORMAL HIGH (ref 70–99)
Potassium: 3.7 meq/L (ref 3.5–5.1)
Sodium: 137 meq/L (ref 135–145)

## 2023-11-19 LAB — HEMOGLOBIN A1C: Hgb A1c MFr Bld: 11.2 % — ABNORMAL HIGH (ref 4.6–6.5)

## 2023-11-23 LAB — ALDOSTERONE + RENIN ACTIVITY W/ RATIO
ALDO / PRA Ratio: 8.7 ratio (ref 0.9–28.9)
Aldosterone: 4 ng/dL
Renin Activity: 0.46 ng/mL/h (ref 0.25–5.82)

## 2023-12-12 ENCOUNTER — Ambulatory Visit (HOSPITAL_BASED_OUTPATIENT_CLINIC_OR_DEPARTMENT_OTHER)
Admission: RE | Admit: 2023-12-12 | Discharge: 2023-12-12 | Disposition: A | Discharge status: Home / Self Care | Source: Ambulatory Visit | Attending: Family Medicine | Admitting: Family Medicine

## 2023-12-12 DIAGNOSIS — I10 Essential (primary) hypertension: Secondary | ICD-10-CM | POA: Insufficient documentation

## 2023-12-12 LAB — ECHOCARDIOGRAM COMPLETE
AR max vel: 3.03 cm2
AV Area VTI: 3.07 cm2
AV Area mean vel: 3.19 cm2
AV Mean grad: 3 mmHg
AV Peak grad: 5.5 mmHg
Ao pk vel: 1.17 m/s
Area-P 1/2: 4.36 cm2
Calc EF: 59.8 %
MV M vel: 3.3 m/s
MV Peak grad: 43.6 mmHg
S' Lateral: 1.9 cm
Single Plane A2C EF: 58.2 %
Single Plane A4C EF: 62.5 %

## 2023-12-30 ENCOUNTER — Ambulatory Visit (INDEPENDENT_AMBULATORY_CARE_PROVIDER_SITE_OTHER): Admitting: Family Medicine

## 2023-12-30 ENCOUNTER — Encounter: Payer: Self-pay | Admitting: Family Medicine

## 2023-12-30 VITALS — BP 140/92 | HR 80 | Temp 97.6°F | Ht 73.0 in | Wt 269.8 lb

## 2023-12-30 DIAGNOSIS — E1169 Type 2 diabetes mellitus with other specified complication: Secondary | ICD-10-CM

## 2023-12-30 DIAGNOSIS — E785 Hyperlipidemia, unspecified: Secondary | ICD-10-CM

## 2023-12-30 DIAGNOSIS — E1165 Type 2 diabetes mellitus with hyperglycemia: Secondary | ICD-10-CM

## 2023-12-30 DIAGNOSIS — I10 Essential (primary) hypertension: Secondary | ICD-10-CM

## 2023-12-30 DIAGNOSIS — E66812 Obesity, class 2: Secondary | ICD-10-CM

## 2023-12-30 DIAGNOSIS — E6609 Other obesity due to excess calories: Secondary | ICD-10-CM

## 2023-12-30 DIAGNOSIS — Z7985 Long-term (current) use of injectable non-insulin antidiabetic drugs: Secondary | ICD-10-CM

## 2023-12-30 DIAGNOSIS — L720 Epidermal cyst: Secondary | ICD-10-CM

## 2023-12-30 DIAGNOSIS — Z6835 Body mass index (BMI) 35.0-35.9, adult: Secondary | ICD-10-CM

## 2023-12-30 MED ORDER — LOVASTATIN 40 MG PO TABS: 40.0000 mg | ORAL_TABLET | Freq: Every day | ORAL | 3 refills | Status: AC

## 2023-12-30 MED ORDER — TIRZEPATIDE 5 MG/0.5ML ~~LOC~~ SOAJ: 5.0000 mg | SUBCUTANEOUS | 1 refills | Status: DC

## 2023-12-30 NOTE — Progress Notes (Unsigned)
 Established Patient Office Visit   Subjective:  Patient ID: Darren Little, male    DOB: 1980-09-20  Age: 43 y.o. MRN: [id number]  Chief Complaint  Patient presents with   Medical Management of Chronic Issues    6 week follow up the patient had US  and echo on 9/11.     HPI Encounter Diagnoses  Name Primary?   Hypertension not at goal Yes   Uncontrolled type 2 diabetes mellitus with hyperglycemia (HCC)    Hyperlipidemia associated with type 2 diabetes mellitus (HCC)    Class 2 obesity due to excess calories without serious comorbidity with body mass index (BMI) of 35.0 to 35.9 in adult    Inclusion cyst    For follow-up of above.  Reports compliance with his amlodipine , Maxide and olmesartan  for uncontrolled hypertension.  He has seen cardiology.  Echocardiogram showed LVH.  He has follow-up appointment with cardiology is pending.  He said that they will call him.  He has been taking his metformin .  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  He has not been able to afford Ozempic  in the past. {History (Optional):23778}  Review of Systems  Constitutional: Negative.   HENT: Negative.    Eyes:  Negative for blurred vision, discharge and redness.  Respiratory: Negative.    Cardiovascular: Negative.   Gastrointestinal:  Negative for abdominal pain.  Genitourinary: Negative.   Musculoskeletal: Negative.  Negative for myalgias.  Skin:  Negative for rash.  Neurological:  Negative for tingling, loss of consciousness and weakness.  Endo/Heme/Allergies:  Negative for polydipsia.     Current Outpatient Medications:    ACCU-CHEK GUIDE test strip, USE 2 STRIPS TO CHECK GLUCOSE TWICE DAILY, Disp: 100 each, Rfl: 0   Accu-Chek Softclix Lancets lancets, Use as instructed, Disp: 100 each, Rfl: 12   amLODipine  (NORVASC ) 10 MG tablet, Take 1 tablet (10 mg total) by mouth daily., Disp: 90 each, Rfl: 0   blood glucose meter kit and supplies KIT, Use to check FSBS  BID. (Dx: E11.29, E11.65, R80.9)., Disp: 1 each, Rfl: 0   diclofenac  Sodium (VOLTAREN ) 1 % GEL, Apply a small grape sized dollop on sore joint up to 4 times a day as needed., Disp: 150 g, Rfl: 2   EPINEPHrine  0.3 mg/0.3 mL IJ SOAJ injection, Inject 0.3 mg into the muscle as needed for anaphylaxis., Disp: 2 each, Rfl: 2   glucose blood (ACCU-CHEK GUIDE TEST) test strip, Use as instructed, Disp: 50 each, Rfl: 12   lovastatin  (MEVACOR ) 40 MG tablet, Take 1 tablet (40 mg total) by mouth at bedtime., Disp: 90 tablet, Rfl: 3   metFORMIN  (GLUCOPHAGE -XR) 500 MG 24 hr tablet, Take 2 tablets (1,000 mg total) by mouth 2 (two) times daily with a meal., Disp: 360 tablet, Rfl: 0   olmesartan  (BENICAR ) 40 MG tablet, Take 1 tablet (40 mg total) by mouth daily., Disp: 90 tablet, Rfl: 0   tirzepatide (MOUNJARO) 5 MG/0.5ML Pen, Inject 5 mg into the skin once a week., Disp: 2 mL, Rfl: 1   triamterene -hydrochlorothiazide  (MAXZIDE-25) 37.5-25 MG tablet, Take 1 tablet by mouth daily., Disp: 90 each, Rfl: 0   Objective:     BP (!) 140/92 (BP Location: Left Arm, Patient Position: Sitting, Cuff Size: Large)   Pulse 80   Temp 97.6 F (36.4 C) (Temporal)   Ht 6' 1 (1.854 m)   Wt 269 lb 12.8 oz (122.4 kg)   SpO2 98%   BMI 35.60 kg/m  {  Vitals History (Optional):23777}  Physical Exam Constitutional:      General: He is not in acute distress.    Appearance: Normal appearance. He is not ill-appearing, toxic-appearing or diaphoretic.  HENT:     Head: Normocephalic and atraumatic.     Right Ear: External ear normal.     Left Ear: External ear normal.  Eyes:     General: No scleral icterus.       Right eye: No discharge.        Left eye: No discharge.     Extraocular Movements: Extraocular movements intact.     Conjunctiva/sclera: Conjunctivae normal.  Pulmonary:     Effort: Pulmonary effort is normal. No respiratory distress.  Skin:    General: Skin is warm and dry.  Neurological:     Mental Status: He is  alert and oriented to person, place, and time.  Psychiatric:        Mood and Affect: Mood normal.        Behavior: Behavior normal.      No results found for any visits on 12/30/23.  {Labs (Optional):23779}  The 10-year ASCVD risk score (Arnett DK, et al., 2019) is: 16.5%    Assessment & Plan:   Hypertension not at goal -     AMB Referral VBCI Care Management  Uncontrolled type 2 diabetes mellitus with hyperglycemia (HCC) -     Amb Referral to Nutrition and Diabetic Education -     AMB Referral VBCI Care Management -     Tirzepatide; Inject 5 mg into the skin once a week.  Dispense: 2 mL; Refill: 1  Hyperlipidemia associated with type 2 diabetes mellitus (HCC) -     Lovastatin ; Take 1 tablet (40 mg total) by mouth at bedtime.  Dispense: 90 tablet; Refill: 3  Class 2 obesity due to excess calories without serious comorbidity with body mass index (BMI) of 35.0 to 35.9 in adult -     Amb Referral to Nutrition and Diabetic Education -     Tirzepatide; Inject 5 mg into the skin once a week.  Dispense: 2 mL; Refill: 1  Inclusion cyst -     Ambulatory referral to General Surgery    Return in about 3 months (around 03/30/2024).  Follow-up with cardiology as directed.  Referral again for nutrition and diabetes counseling.  He believes that he may be able to afford a GLP-1 agonist at this time.  We discussed the common side effects of nausea, diarrhea and/or constipation.  Severe abdominal pain needing immediate emergency evaluation that could signify gallbladder disease, intestinal obstruction and/or pancreatitis.  There is a theoretical risk of thyroid  cancer.  Referral to our pharmacist for help with controlling diabetes and hypertension.   Elsie Sim Lent, MD

## 2023-12-31 ENCOUNTER — Telehealth: Payer: Self-pay

## 2023-12-31 NOTE — Progress Notes (Signed)
 Complex Care Management Note  Care Guide Note 12/31/2023 Name: Darren Little MRN: [id number] DOB: Nov 24, 1980  Darren Little is a 43 y.o. year old male who sees Berneta Elsie Sayre, MD for primary care. I reached out to Butler LITTIE Gearing by phone today to offer complex care management services.  [name] was given information about Complex Care Management services today including:   The Complex Care Management services include support from the care team which includes your Nurse Care Manager, Clinical Social Worker, or Pharmacist.  The Complex Care Management team is here to help remove barriers to the health concerns and goals most important to you. Complex Care Management services are voluntary, and the patient may decline or stop services at any time by request to their care team member.   Complex Care Management Consent Status: Patient agreed to services and verbal consent obtained.   Follow up plan:  Telephone appointment with complex care management team member scheduled for:  01/03/24 at 3:30 p.m.  Encounter Outcome:  Patient Scheduled  Dreama [facility]  [facility], [facility] VBCI Assistant Direct Dial: [phone]  Fax: [phone]

## 2024-01-03 ENCOUNTER — Other Ambulatory Visit

## 2024-01-03 NOTE — Progress Notes (Unsigned)
   01/03/2024  Patient ID: Darren Little, male   DOB: 07/02/1980, 43 y.o.   MRN: [id number]  Initial outreach for scheduled telephone visit was unsuccessful, and patient's voicemail was full.  I was able to reach patient on my second attempt, but he requested visit be rescheduled to Monday.  Telephone visit moved to Monday at 1130am.  Darren Little, PharmD, DPLA

## 2024-01-06 ENCOUNTER — Other Ambulatory Visit: Payer: Self-pay

## 2024-01-06 DIAGNOSIS — E1165 Type 2 diabetes mellitus with hyperglycemia: Secondary | ICD-10-CM

## 2024-01-06 DIAGNOSIS — I10 Essential (primary) hypertension: Secondary | ICD-10-CM

## 2024-01-06 DIAGNOSIS — E66812 Obesity, class 2: Secondary | ICD-10-CM

## 2024-01-06 DIAGNOSIS — E1169 Type 2 diabetes mellitus with other specified complication: Secondary | ICD-10-CM

## 2024-01-06 NOTE — Progress Notes (Unsigned)
 01/06/2024 Name: Darren Little MRN: [id number] DOB: June 20, 1980  Chief Complaint  Patient presents with   Medication Management   Darren Little is a 43 y.o. year old male who presented for a telephone visit.   They were referred to the pharmacist by their PCP for assistance in managing diabetes, hypertension, and hyperlipidemia/cardiovascular risk reduction.   Subjective:  Care Team: Primary Care Provider: Berneta Elsie Sayre, MD ; Next Scheduled Visit: 12/29  Medication Access/Adherence  Current Pharmacy:  [facility] - [place], [place] - [address] [place]  [address] Phone: [phone] Fax: [phone]  -Patient reports affordability concerns with their medications: No  -Patient reports access/transportation concerns to their pharmacy: No  -Patient reports adherence concerns with their medications:  No    Diabetes: Current medications: metformin  XR 1000mg  BID -Medications tried in the past: Ozempic - patient states he was on this 2-3 years ago and tolerated well, but the medication was expensive for him -Endorses stomach upset with current metformin  dose; and fill history does not reflect good adherence, which may be due to side effects -Current glucose readings: FBG 100-150 per patient -Patient needs refills for lancets and test strips (would like these sent to [facility]) -Mounjaro 5mg  weekly recently prescribed, but patient has not started this medication yet- has not received from [facility] and would like sent to [facility]  -Patient endorses recent dietary modifications including decreased intake of sugary beverages, increased water intake, increased fresh vegetable intake, and decreased carbohydrate intake.  He also plans to get a gym membership to help increase activity. -Would like to lose weight and be able to come off of some of his medications in the future  Macrovascular and  Microvascular Risk Reduction:  Statin? yes (lovastatin  40mg  daily ); ACEi/ARB? yes (olmesartan  40mg  daily ) Last urinary albumin/creatinine ratio:  Lab Results  Component Value Date   MICRALBCREAT 364.9 (H) 08/05/2023   Last eye exam:  Lab Results  Component Value Date   HMDIABEYEEXA No Retinopathy 11/14/2020   Last foot exam: No foot exam found Tobacco Use:  Tobacco Use: Low Risk  (12/30/2023)   Patient History    Smoking Tobacco Use: Never    Smokeless Tobacco Use: Never    Passive Exposure: Not on file   Hypertension: Current medications: amlodipine  10mg  daily, olmesartan  40mg  daily, triamterene -hydrochlorothiazide  37.5/25mg  daily -Patient has a validated, automated, upper arm home BP cuff -Current blood pressure readings readings: 134/79  Hyperlipidemia/ASCVD Risk Reduction Current lipid lowering medications: lovastatin  40mg  daily  Objective: Lab Results  Component Value Date   HGBA1C 11.2 (H) 11/18/2023   Lab Results  Component Value Date   CREATININE 1.28 11/18/2023   BUN 13 11/18/2023   NA 137 11/18/2023   K 3.7 11/18/2023   CL 101 11/18/2023   CO2 28 11/18/2023   Lab Results  Component Value Date   CHOL 200 08/05/2023   HDL 33.80 (L) 08/05/2023   LDLCALC 149 (H) 08/05/2023   LDLDIRECT 135.0 11/18/2023   TRIG 85.0 08/05/2023   CHOLHDL 6 08/05/2023   Medications Reviewed Today     Reviewed by Darren Little, RPH (Pharmacist) on 01/06/24 at 1216  Med List Status: <None>   Medication Order Taking? Sig Documenting Provider Last Dose Status Informant    Discontinued 01/06/24 1216 (Duplicate)   Accu-Chek Softclix Lancets lancets [id number] Yes Use as instructed Darren Elsie Sayre, MD  Active   amLODipine  (NORVASC ) 10 MG tablet  [id number] Yes Take 1 tablet (10 mg total) by mouth daily. Darren Elsie Sayre, MD  Active   blood glucose meter kit and supplies KIT [id number] Yes Use to check FSBS BID. (Dx: E11.29, E11.65, R80.9). Darren Barnie NOVAK, MD   Active   diclofenac  Sodium (VOLTAREN ) 1 % GEL [id number]  Apply a small grape sized dollop on sore joint up to 4 times a day as needed. Darren Elsie Sayre, MD  Active   EPINEPHrine  0.3 mg/0.3 mL IJ SOAJ injection [id number] Yes Inject 0.3 mg into the muscle as needed for anaphylaxis. Darren Elsie Sayre, MD  Active   glucose blood (ACCU-CHEK GUIDE TEST) test strip [id number] Yes Use as instructed Darren Elsie Sayre, MD  Active   lovastatin  (MEVACOR ) 40 MG tablet [id number] Yes Take 1 tablet (40 mg total) by mouth at bedtime. Darren Elsie Sayre, MD  Active   metFORMIN  (GLUCOPHAGE -XR) 500 MG 24 hr tablet [id number] Yes Take 2 tablets (1,000 mg total) by mouth 2 (two) times daily with a meal. Darren Elsie Sayre, MD  Active   olmesartan  (BENICAR ) 40 MG tablet [id number] Yes Take 1 tablet (40 mg total) by mouth daily. Darren Elsie Sayre, MD  Active   tirzepatide [facility]) 5 MG/0.5ML Pen [id number]  Inject 5 mg into the skin once a week.  Patient not taking: Reported on 01/06/2024   Darren Elsie Sayre, MD  Active   triamterene -hydrochlorothiazide  [facility]) 37.5-25 MG tablet [id number] Yes Take 1 tablet by mouth daily. Darren Elsie Sayre, MD  Active   Med List Note Delaine Elsie Little Little 01/12/19 1312): 1           Assessment/Plan:   Diabetes: -Currently uncontrolled; goal A1c <7%. Cardiorenal risk reduction is opportunities for improvement.. Blood pressure is not at goal <130/80. LDL is not at goal.  -Reviewed long term cardiovascular and renal outcomes of uncontrolled blood sugar. and Reviewed goal A1c, goal fasting, and goal 2 hour post prandial glucose. Recommended to check glucose first thing in the morning before eating and again 1-2 hours after at least one meal a day -Recommend to start Mounjaro 2.5mg  weekly since patient has not been on a GLP1 medication in over a year and since he experiences stomach upset already from metformin  .  Contacted the  pharmacy, and medication is going through for $25 on insurance.  Patient would like this and test strip and lancets sent to [facility]- order pending for Dr. Berneta to sign. -Can consider decreasing metformin  once Mounjaro on board to help with diarrhea he endorses with this medication -Sees PCP again 12/29 and will be due for A1c; if <9%, no ketones present in urine and no PHM of recurrent UTI's, I would recommend addition of SGLT2 for cardiorenal protection and A1c benefit  Hypertension: -Currently uncontrolled but home BP readings reflect some improvement -Reviewed long term cardiovascular and renal outcomes of uncontrolled blood pressure -Continue current regimen at this time -Monitor home BP regularly with automated, upper arm monitor  Hyperlipidemia/ASCVD Risk Reduction: -Currently uncontrolled.  -Reviewed long term complications of uncontrolled cholesterol -Reviewed dietary recommendations including increased fruit and vegetable intake; minimize saturated fats and red meat -Continue lovastatin  40mg  daily -I recommend a follow-up lipid panel and CMP at next PCP visit; if LDL remains >70, consider changing lovastatin  to rosuvastatin (atorvastatin  caused hives in the past)  Follow Up Plan: Will notify patient on cost of Mounjaro, test strips, lancets, and metformin  as well as when to expect these to be delivered.  I will also  schedule a 4 week telephone follow-up.  Little DELENA Mealing, PharmD, DPLA

## 2024-01-07 MED ORDER — ACCU-CHEK GUIDE TEST VI STRP: ORAL_STRIP | 12 refills | Status: AC

## 2024-01-07 MED ORDER — METFORMIN HCL ER 500 MG PO TB24: 1000.0000 mg | ORAL_TABLET | Freq: Two times a day (BID) | ORAL | 0 refills | Status: AC

## 2024-01-07 MED ORDER — TIRZEPATIDE 2.5 MG/0.5ML ~~LOC~~ SOAJ: 2.5000 mg | SUBCUTANEOUS | 0 refills | Status: AC

## 2024-01-07 MED ORDER — ACCU-CHEK SOFTCLIX LANCETS MISC: 12 refills | Status: AC

## 2024-01-20 ENCOUNTER — Ambulatory Visit: Payer: Self-pay | Admitting: General Surgery

## 2024-01-24 ENCOUNTER — Other Ambulatory Visit: Payer: Self-pay | Admitting: Family Medicine

## 2024-01-24 DIAGNOSIS — I10 Essential (primary) hypertension: Secondary | ICD-10-CM

## 2024-02-07 ENCOUNTER — Other Ambulatory Visit: Payer: Self-pay

## 2024-02-07 ENCOUNTER — Encounter (HOSPITAL_BASED_OUTPATIENT_CLINIC_OR_DEPARTMENT_OTHER): Payer: Self-pay | Admitting: General Surgery

## 2024-02-10 ENCOUNTER — Ambulatory Visit: Admitting: Physician Assistant

## 2024-02-10 ENCOUNTER — Other Ambulatory Visit: Payer: Self-pay | Admitting: Family Medicine

## 2024-02-10 DIAGNOSIS — I10 Essential (primary) hypertension: Secondary | ICD-10-CM

## 2024-02-13 ENCOUNTER — Encounter (HOSPITAL_BASED_OUTPATIENT_CLINIC_OR_DEPARTMENT_OTHER)
Admission: RE | Admit: 2024-02-13 | Discharge: 2024-02-13 | Disposition: A | Discharge status: Home / Self Care | Source: Ambulatory Visit | Attending: General Surgery | Admitting: General Surgery

## 2024-02-13 DIAGNOSIS — E1169 Type 2 diabetes mellitus with other specified complication: Secondary | ICD-10-CM | POA: Diagnosis not present

## 2024-02-13 DIAGNOSIS — E119 Type 2 diabetes mellitus without complications: Secondary | ICD-10-CM | POA: Insufficient documentation

## 2024-02-13 DIAGNOSIS — Z6836 Body mass index (BMI) 36.0-36.9, adult: Secondary | ICD-10-CM | POA: Diagnosis not present

## 2024-02-13 DIAGNOSIS — Z01818 Encounter for other preprocedural examination: Secondary | ICD-10-CM | POA: Diagnosis not present

## 2024-02-13 DIAGNOSIS — L729 Follicular cyst of the skin and subcutaneous tissue, unspecified: Secondary | ICD-10-CM | POA: Diagnosis present

## 2024-02-13 DIAGNOSIS — Z7984 Long term (current) use of oral hypoglycemic drugs: Secondary | ICD-10-CM | POA: Diagnosis not present

## 2024-02-13 DIAGNOSIS — I10 Essential (primary) hypertension: Secondary | ICD-10-CM | POA: Diagnosis not present

## 2024-02-13 DIAGNOSIS — Z7985 Long-term (current) use of injectable non-insulin antidiabetic drugs: Secondary | ICD-10-CM | POA: Diagnosis not present

## 2024-02-13 DIAGNOSIS — E7849 Other hyperlipidemia: Secondary | ICD-10-CM | POA: Diagnosis not present

## 2024-02-13 DIAGNOSIS — E66812 Obesity, class 2: Secondary | ICD-10-CM | POA: Diagnosis not present

## 2024-02-13 LAB — BASIC METABOLIC PANEL WITH GFR
Anion gap: 12 (ref 5–15)
BUN: 10 mg/dL (ref 6–20)
CO2: 23 mmol/L (ref 22–32)
Calcium: 8.8 mg/dL — ABNORMAL LOW (ref 8.9–10.3)
Chloride: 101 mmol/L (ref 98–111)
Creatinine, Ser: 1.29 mg/dL — ABNORMAL HIGH (ref 0.61–1.24)
GFR, Estimated: >60 mL/min (ref >60)
Glucose, Bld: 287 mg/dL — ABNORMAL HIGH (ref 70–99)
Potassium: 4.2 mmol/L (ref 3.5–5.1)
Sodium: 136 mmol/L (ref 135–145)

## 2024-02-13 MED ORDER — CHLORHEXIDINE GLUCONATE CLOTH 2 % EX PADS: 6.0000 | MEDICATED_PAD | Freq: Once | CUTANEOUS | Status: DC

## 2024-02-13 NOTE — Progress Notes (Signed)

## 2024-02-14 ENCOUNTER — Ambulatory Visit (HOSPITAL_BASED_OUTPATIENT_CLINIC_OR_DEPARTMENT_OTHER)
Admission: RE | Admit: 2024-02-14 | Discharge: 2024-02-14 | Disposition: A | Discharge status: Home / Self Care | Attending: General Surgery | Admitting: General Surgery

## 2024-02-14 ENCOUNTER — Ambulatory Visit (HOSPITAL_BASED_OUTPATIENT_CLINIC_OR_DEPARTMENT_OTHER): Admitting: Anesthesiology

## 2024-02-14 ENCOUNTER — Encounter (HOSPITAL_BASED_OUTPATIENT_CLINIC_OR_DEPARTMENT_OTHER): Payer: Self-pay | Admitting: General Surgery

## 2024-02-14 ENCOUNTER — Encounter (HOSPITAL_BASED_OUTPATIENT_CLINIC_OR_DEPARTMENT_OTHER): Admission: RE | Disposition: A | Payer: Self-pay | Source: Home / Self Care | Attending: General Surgery

## 2024-02-14 ENCOUNTER — Other Ambulatory Visit: Payer: Self-pay

## 2024-02-14 DIAGNOSIS — I10 Essential (primary) hypertension: Secondary | ICD-10-CM | POA: Insufficient documentation

## 2024-02-14 DIAGNOSIS — L729 Follicular cyst of the skin and subcutaneous tissue, unspecified: Secondary | ICD-10-CM | POA: Diagnosis not present

## 2024-02-14 DIAGNOSIS — E1169 Type 2 diabetes mellitus with other specified complication: Secondary | ICD-10-CM | POA: Insufficient documentation

## 2024-02-14 DIAGNOSIS — E66812 Obesity, class 2: Secondary | ICD-10-CM | POA: Insufficient documentation

## 2024-02-14 DIAGNOSIS — G4733 Obstructive sleep apnea (adult) (pediatric): Secondary | ICD-10-CM | POA: Diagnosis not present

## 2024-02-14 DIAGNOSIS — E119 Type 2 diabetes mellitus without complications: Secondary | ICD-10-CM

## 2024-02-14 DIAGNOSIS — Z7984 Long term (current) use of oral hypoglycemic drugs: Secondary | ICD-10-CM | POA: Insufficient documentation

## 2024-02-14 DIAGNOSIS — Z7985 Long-term (current) use of injectable non-insulin antidiabetic drugs: Secondary | ICD-10-CM | POA: Insufficient documentation

## 2024-02-14 DIAGNOSIS — Z6836 Body mass index (BMI) 36.0-36.9, adult: Secondary | ICD-10-CM | POA: Insufficient documentation

## 2024-02-14 DIAGNOSIS — E7849 Other hyperlipidemia: Secondary | ICD-10-CM | POA: Insufficient documentation

## 2024-02-14 HISTORY — PX: LESION EXCISION: SHX5167

## 2024-02-14 LAB — GLUCOSE, CAPILLARY
Glucose-Capillary: 185 mg/dL — ABNORMAL HIGH (ref 70–99)
Glucose-Capillary: 174 mg/dL — ABNORMAL HIGH (ref 70–99)

## 2024-02-14 SURGERY — EXCISION, LESION, SCALP
Anesthesia: General

## 2024-02-14 MED ORDER — ONDANSETRON HCL 4 MG/2ML IJ SOLN
INTRAMUSCULAR | Status: DC | PRN
  Administered 2024-02-14: 4 mg via INTRAVENOUS

## 2024-02-14 MED ORDER — FENTANYL CITRATE (PF) 100 MCG/2ML IJ SOLN
INTRAMUSCULAR | Status: AC
  Filled 2024-02-14: qty 2

## 2024-02-14 MED ORDER — LIDOCAINE 2% (20 MG/ML) 5 ML SYRINGE
INTRAMUSCULAR | Status: AC
  Filled 2024-02-14: qty 5

## 2024-02-14 MED ORDER — PROPOFOL 500 MG/50ML IV EMUL
INTRAVENOUS | Status: AC
  Filled 2024-02-14: qty 50

## 2024-02-14 MED ORDER — LIDOCAINE 2% (20 MG/ML) 5 ML SYRINGE
INTRAMUSCULAR | Status: DC | PRN
  Administered 2024-02-14: 100 mg via INTRAVENOUS

## 2024-02-14 MED ORDER — ACETAMINOPHEN 500 MG PO TABS
1000.0000 mg | ORAL_TABLET | Freq: Once | ORAL | Status: AC
  Administered 2024-02-14: 1000 mg via ORAL

## 2024-02-14 MED ORDER — ACETAMINOPHEN 325 MG PO TABS: 650.0000 mg | ORAL_TABLET | ORAL | Status: DC | PRN

## 2024-02-14 MED ORDER — OXYCODONE HCL 5 MG/5ML PO SOLN: 5.0000 mg | Freq: Once | ORAL | Status: DC | PRN

## 2024-02-14 MED ORDER — SUCCINYLCHOLINE CHLORIDE 200 MG/10ML IV SOSY
PREFILLED_SYRINGE | INTRAVENOUS | Status: DC | PRN
  Administered 2024-02-14: 140 mg via INTRAVENOUS

## 2024-02-14 MED ORDER — ACETAMINOPHEN 325 MG RE SUPP: 650.0000 mg | RECTAL | Status: DC | PRN

## 2024-02-14 MED ORDER — MIDAZOLAM HCL 5 MG/5ML IJ SOLN
INTRAMUSCULAR | Status: DC | PRN
  Administered 2024-02-14: 2 mg via INTRAVENOUS

## 2024-02-14 MED ORDER — GABAPENTIN 300 MG PO CAPS
ORAL_CAPSULE | ORAL | Status: AC
  Filled 2024-02-14: qty 1

## 2024-02-14 MED ORDER — ACETAMINOPHEN 500 MG PO TABS: 1000.0000 mg | ORAL_TABLET | ORAL | Status: AC

## 2024-02-14 MED ORDER — BUPIVACAINE-EPINEPHRINE 0.25% -1:200000 IJ SOLN
INTRAMUSCULAR | Status: DC | PRN
  Administered 2024-02-14: 10 mL

## 2024-02-14 MED ORDER — ACETAMINOPHEN 325 MG PO TABS: 650.0000 mg | ORAL_TABLET | Freq: Four times a day (QID) | ORAL | 0 refills | Status: AC

## 2024-02-14 MED ORDER — SODIUM CHLORIDE 0.9 % IV SOLN: 250.0000 mL | INTRAVENOUS | Status: DC | PRN

## 2024-02-14 MED ORDER — ACETAMINOPHEN 500 MG PO TABS
ORAL_TABLET | ORAL | Status: AC
Start: 2024-02-14 — End: 2024-02-14
  Filled 2024-02-14: qty 2

## 2024-02-14 MED ORDER — MIDAZOLAM HCL 2 MG/2ML IJ SOLN
INTRAMUSCULAR | Status: AC
  Filled 2024-02-14: qty 2

## 2024-02-14 MED ORDER — SODIUM CHLORIDE 0.9% FLUSH: 3.0000 mL | INTRAVENOUS | Status: DC | PRN

## 2024-02-14 MED ORDER — FENTANYL CITRATE (PF) 100 MCG/2ML IJ SOLN
INTRAMUSCULAR | Status: DC | PRN
  Administered 2024-02-14: 100 ug via INTRAVENOUS

## 2024-02-14 MED ORDER — OXYCODONE HCL 5 MG PO TABS: 5.0000 mg | ORAL_TABLET | ORAL | Status: DC | PRN

## 2024-02-14 MED ORDER — 0.9 % SODIUM CHLORIDE (POUR BTL) OPTIME
TOPICAL | Status: DC | PRN
  Administered 2024-02-14: 1000 mL

## 2024-02-14 MED ORDER — IBUPROFEN 200 MG PO TABS: 600.0000 mg | ORAL_TABLET | Freq: Four times a day (QID) | ORAL | 0 refills | Status: AC

## 2024-02-14 MED ORDER — GLYCOPYRROLATE 0.2 MG/ML IJ SOLN
INTRAMUSCULAR | Status: DC | PRN
  Administered 2024-02-14: .1 mg via INTRAVENOUS

## 2024-02-14 MED ORDER — CEFAZOLIN SODIUM-DEXTROSE 3-4 GM/150ML-% IV SOLN
3.0000 g | INTRAVENOUS | Status: AC
  Administered 2024-02-14: 3 g via INTRAVENOUS

## 2024-02-14 MED ORDER — SODIUM CHLORIDE 0.9% FLUSH: 3.0000 mL | Freq: Two times a day (BID) | INTRAVENOUS | Status: DC

## 2024-02-14 MED ORDER — LACTATED RINGERS IV SOLN: INTRAVENOUS | Status: DC

## 2024-02-14 MED ORDER — SUCCINYLCHOLINE CHLORIDE 200 MG/10ML IV SOSY
PREFILLED_SYRINGE | INTRAVENOUS | Status: AC
  Filled 2024-02-14: qty 10

## 2024-02-14 MED ORDER — ONDANSETRON HCL 4 MG/2ML IJ SOLN
INTRAMUSCULAR | Status: AC
  Filled 2024-02-14: qty 2

## 2024-02-14 MED ORDER — CEFAZOLIN SODIUM-DEXTROSE 3-4 GM/150ML-% IV SOLN
INTRAVENOUS | Status: AC
  Filled 2024-02-14: qty 150

## 2024-02-14 MED ORDER — GABAPENTIN 300 MG PO CAPS
300.0000 mg | ORAL_CAPSULE | ORAL | Status: AC
  Administered 2024-02-14: 300 mg via ORAL

## 2024-02-14 MED ORDER — DEXAMETHASONE SODIUM PHOSPHATE 4 MG/ML IJ SOLN
INTRAMUSCULAR | Status: DC | PRN
  Administered 2024-02-14: 10 mg via INTRAVENOUS

## 2024-02-14 MED ORDER — FENTANYL CITRATE (PF) 100 MCG/2ML IJ SOLN: 25.0000 ug | INTRAMUSCULAR | Status: DC | PRN

## 2024-02-14 MED ORDER — PROPOFOL 10 MG/ML IV BOLUS
INTRAVENOUS | Status: DC | PRN
  Administered 2024-02-14: 50 mg via INTRAVENOUS
  Administered 2024-02-14: 200 mg via INTRAVENOUS

## 2024-02-14 MED ORDER — AMISULPRIDE (ANTIEMETIC) 5 MG/2ML IV SOLN: 10.0000 mg | Freq: Once | INTRAVENOUS | Status: DC | PRN

## 2024-02-14 MED ORDER — OXYCODONE HCL 5 MG PO TABS: 5.0000 mg | ORAL_TABLET | Freq: Three times a day (TID) | ORAL | 0 refills | Status: AC | PRN

## 2024-02-14 MED ORDER — DEXMEDETOMIDINE HCL IN NACL 80 MCG/20ML IV SOLN
INTRAVENOUS | Status: DC | PRN
  Administered 2024-02-14: 12 ug via INTRAVENOUS

## 2024-02-14 MED ORDER — OXYCODONE HCL 5 MG PO TABS: 5.0000 mg | ORAL_TABLET | Freq: Once | ORAL | Status: DC | PRN

## 2024-02-14 MED ORDER — MORPHINE SULFATE (PF) 4 MG/ML IV SOLN: 1.0000 mg | INTRAVENOUS | Status: DC | PRN

## 2024-02-14 SURGICAL SUPPLY — 37 items
BENZOIN TINCTURE PRP APPL 2/3 (GAUZE/BANDAGES/DRESSINGS) IMPLANT
BLADE CLIPPER SURG (BLADE) IMPLANT
BLADE SURG 10 STRL SS (BLADE) IMPLANT
BLADE SURG 15 STRL LF DISP TIS (BLADE) ×1 IMPLANT
BNDG ELASTIC 4INX 5YD STR LF (GAUZE/BANDAGES/DRESSINGS) IMPLANT
CANISTER SUCT 1200ML W/VALVE (MISCELLANEOUS) IMPLANT
CHLORAPREP W/TINT 26 (MISCELLANEOUS) ×1 IMPLANT
COVER BACK TABLE 60X90IN (DRAPES) ×1 IMPLANT
COVER MAYO STAND STRL (DRAPES) ×1 IMPLANT
DERMABOND ADVANCED .7 DNX12 (GAUZE/BANDAGES/DRESSINGS) IMPLANT
DRAPE LAPAROTOMY 100X72 PEDS (DRAPES) ×1 IMPLANT
DRAPE UTILITY XL STRL (DRAPES) ×1 IMPLANT
ELECT COATED BLADE 2.86 ST (ELECTRODE) ×1 IMPLANT
ELECTRODE REM PT RTRN 9FT ADLT (ELECTROSURGICAL) ×1 IMPLANT
GLOVE BIO SURGEON STRL SZ7 (GLOVE) ×1 IMPLANT
GLOVE BIOGEL PI IND STRL 7.5 (GLOVE) ×1 IMPLANT
GOWN STRL REUS W/ TWL LRG LVL3 (GOWN DISPOSABLE) ×1 IMPLANT
GOWN STRL REUS W/ TWL XL LVL3 (GOWN DISPOSABLE) ×1 IMPLANT
NDL HYPO 25X1 1.5 SAFETY (NEEDLE) ×1 IMPLANT
NEEDLE HYPO 25X1 1.5 SAFETY (NEEDLE) ×1 IMPLANT
PACK BASIN DAY SURGERY FS (CUSTOM PROCEDURE TRAY) ×1 IMPLANT
PENCIL SMOKE EVACUATOR (MISCELLANEOUS) ×1 IMPLANT
SLEEVE SCD COMPRESS KNEE MED (STOCKING) ×1 IMPLANT
SOLN 0.9% NACL POUR BTL 1000ML (IV SOLUTION) IMPLANT
SPIKE FLUID TRANSFER (MISCELLANEOUS) IMPLANT
SPONGE T-LAP 4X18 ~~LOC~~+RFID (SPONGE) IMPLANT
STAPLER SKIN PROX WIDE 3.9 (STAPLE) IMPLANT
STRIP CLOSURE SKIN 1/2X4 (GAUZE/BANDAGES/DRESSINGS) IMPLANT
SUT MNCRL AB 4-0 PS2 18 (SUTURE) IMPLANT
SUT MON AB 4-0 PC3 18 (SUTURE) ×1 IMPLANT
SUT VIC AB 3-0 SH 27X BRD (SUTURE) IMPLANT
SUT VICRYL 3-0 CR8 SH (SUTURE) IMPLANT
SUT VICRYL AB 3 0 TIES (SUTURE) IMPLANT
SYR CONTROL 10ML LL (SYRINGE) ×1 IMPLANT
TOWEL GREEN STERILE FF (TOWEL DISPOSABLE) ×2 IMPLANT
TUBE CONNECTING 20X1/4 (TUBING) IMPLANT
YANKAUER SUCT BULB TIP NO VENT (SUCTIONS) IMPLANT

## 2024-02-14 NOTE — Anesthesia Postprocedure Evaluation (Signed)
 Anesthesia Post Note  Patient: Darren Little  Procedure(s) Performed: EXCISION, LESION, SCALP     Patient location during evaluation: PACU Anesthesia Type: General Level of consciousness: awake Pain management: pain level controlled Vital Signs Assessment: post-procedure vital signs reviewed and stable Respiratory status: spontaneous breathing, nonlabored ventilation and respiratory function stable Cardiovascular status: blood pressure returned to baseline and stable Postop Assessment: no apparent nausea or vomiting Anesthetic complications: no   No notable events documented.  Last Vitals:  Vitals:   02/14/24 1230 02/14/24 1234  BP:  (!) 156/100  Pulse:  62  Resp:  16  Temp:  (!) 36.3 C  SpO2: 99% 96%    Last Pain:  Vitals:   02/14/24 1234  TempSrc:   PainSc: 0-No pain                 Delon Aisha Arch

## 2024-02-14 NOTE — Anesthesia Procedure Notes (Signed)
 Procedure Name: Intubation Date/Time: 02/14/2024 10:58 AM  Performed by: Franchot Izetta ORN, CRNAPre-anesthesia Checklist: Patient identified, Emergency Drugs available, Suction available and Patient being monitored Patient Re-evaluated:Patient Re-evaluated prior to induction Oxygen  Delivery Method: Circle system utilized Preoxygenation: Pre-oxygenation with 100% oxygen  Induction Type: IV induction Ventilation: Mask ventilation without difficulty Laryngoscope Size: Miller and 2 Grade View: Grade I Tube type: Oral Tube size: 7.5 mm Number of attempts: 1 Airway Equipment and Method: Stylet Placement Confirmation: ETT inserted through vocal cords under direct vision, positive ETCO2 and breath sounds checked- equal and bilateral Secured at: 22 cm Tube secured with: Tape Dental Injury: Teeth and Oropharynx as per pre-operative assessment

## 2024-02-14 NOTE — Transfer of Care (Signed)
 Immediate Anesthesia Transfer of Care Note  Patient: Darren Little  Procedure(s) Performed: EXCISION, LESION, SCALP  Patient Location: PACU  Anesthesia Type:General  Level of Consciousness: drowsy  Airway & Oxygen  Therapy: Patient Spontanous Breathing and Patient connected to face mask oxygen   Post-op Assessment: Report given to RN and Post -op Vital signs reviewed and stable  Post vital signs: Reviewed and stable  Last Vitals:  Vitals Value Taken Time  BP 150/100 02/14/24 11:48  Temp    Pulse 70 02/14/24 11:50  Resp 18 02/14/24 11:50  SpO2 100 % 02/14/24 11:50  Vitals shown include unfiled device data.  Last Pain:  Vitals:   02/14/24 0908  TempSrc: Temporal  PainSc: 0-No pain      Patients Stated Pain Goal: 4 (02/14/24 0908)  Complications: No notable events documented.

## 2024-02-14 NOTE — Discharge Instructions (Addendum)
 Outpatient Surgery Home Care Instruction  Activity  The effects of anesthesia are still present and drowsiness may result.  Limit activity for the first 24 hours, then you may return to normal daily activities. Returning to normal daily activities as soon as you can following surgery will enhance recovery time.  Do not drive or operate heavy machinery within 24 hours of taking narcotic pain medications.   Do not mow the lawn, use a vacuum cleaner, or do any other strenuous activities without first consulting your surgical team.   Diet Drink plenty of fluids and eat light meals today, then resume regular diet. Some patients may find their appetite is poor for a week or two after surgery. This is a normal result of the stress of surgery-your appetite will return in time.   There are no specific diet restrictions after surgery.   Dressing and Wound Care  Keep your wound or incision site clean and dry.  You may have different types of dressings covering your incisions depending on your operation and your surgeon: Dermabond/Durabond (skin glue): This will usually remain in place for 10-14 days, then naturally fall off your skin. You may take a shower 24 hrs after surgery, carefully wash, not scrub the incision site with a mild non-scented soap. Pat dry with a soft towel.  Do not pick or peel skin glue off.  You can shower and let the water fall on the dressings above. Do not soak or submerge your incision(s) in a bath tub, hot tub, or swimming pool, until your doctor says it is ok to do so or the incision(s) have completely healed, usually about 2-4 weeks.  Do not use creams, powder, salves or balms on your incision(s).  What to Expect After Surgery   Moderate discomfort controlled with medications  Minimal drainage from incision  Feeling fatigue and weak  Constipation after surgery is common. Drink plenty fluids and eat a high fiber diet.   Pain Control: Prescribed Non-Narcotic Pain  Medication  You will be given three prescriptions.  Two of them will be for prescription strength ibuprofen (i.e. Advil) and prescription strength acetaminophen  (i.e. Tylenol ).  The vast majority of patients will just need these two medications.  One prescription will be for a 'rescue' prescription of an oral narcotic (oxycodone ).  You may fill this if needed.  You will alternate taking the ibuprofen (600mg ) every 6 hours and also the acetaminophen  (650mg ) every 6 hours so that you are taking one of those medications every 3 hours.  For example: o 0800 - take ibuprofen 600mg  o 1100 - take acetaminophen  650mg  o 1400 - take ibuprofen 600mg  o 1700 - take acetaminophen  650mg  o Etc.  Continue taking this alternating pattern of ibuprofen and acetaminophen  for 3 days  If you cannot take one or the other of these medications, just take the one you can every 6 hours.  If you are comfortable at night, you don't have to wake up and take a medication.  If you are still uncomfortable after taking either ibuprofen or acetaminophen , try gentle stretching exercise and ice packs (a bag of frozen vegetables works great).  If you are still uncomfortable, you may fill the narcotic prescription of Oxycodone  and take as directed.  Once you have completed these prescriptions, your pain level should be low enough to stop taking medications altogether or just use an over the counter medication (ibuprofen or acetaminophen ) as needed.    Pain Control: Over the Counter Medications to take as needed  Colace/Docusate: May be prescribed by your surgeon to prevent constipation caused by the combination of narcotics, effects of anesthesia, and decreased ambulation.  Hold for loose stools or diarrhea. Take 100 mg 1-2 times a day starting tonight.   Fiber: High fiber foods, extra liquids (water 9-13 cups/day) can also assist with constipation. Examples of high fiber foods are fruit, bran. Prune juice and water are also good liquids  to drink.  Milk of Magnesia/Miralax:  If constipated despite takeing the over the counter stool softeners, you may take Milk of Magnesia or Miralax as directed on bottle to assist with constipation.     Pepcid /Famotidine : May be prescribed while taking naproxen  (Aleve ) or other NSAIDs such as ibuprofen (Motrin/Advil) to prevent stomach upset or Acid-reflux symptoms. Take 1 tablet 1-2 times a day.   **Constipation: The first bowel movement may occur anywhere between 1-5 days after surgery.  As long as you are not nauseated or not having significant abdominal pain this variation is acceptable. Narcotic pain medications can cause constipation increasing discomfort; early discontinuation will assist with bowel management. If constipated despite taking stool softeners, you may take Milk of Magnesia or Miralax as directed on the bottle.     **Home medications: You may restart your home medications as directed by your respective Primary Care Physician or Surgeon.   When to notify your Doctor or Healthcare Team   Sign of Wound Infection   Fever over 100 degrees.  Wound becomes extremely swollen, shows red streaks, warm to the touch, and/or drainage from the incision site or foul-smelling drainage.  Wound edges separate or opens up  Bleeding or bruising   If you have bleeding, apply pressure to the site and hold the pressure firmly for 5 minutes. If the bleeding continues, apply pressure again and call 911. If the bleeding stopped, call your doctor to report it.   Call your doctor or nurse if you have increased bleeding from your site and increased bruising or a lump forms or gets larger under your skin at the site. Unrelieved Pain   Call your doctor or nurse if your pain gets worse or is not eased 1 hour after taking your pain medicine, or if it is severe and uncontrolled. Nausea and Vomiting   Call your doctor or nurse if you have nausea and vomiting that continues more than 24 hours, will not let you  keep medicine down and will not let you keep fluids down  Fever, Flu-like symptoms   Fever over 100 degrees and/or chills  Gastrointestinal Bleeding Symptoms    Black tarry bowel movements.  This can be normal after surgery on the stomach, but should resolve in a day or two.    Call 911 if you suddenly have signs of blood loss such as:  Vomiting blood  Fast heart rate  Feeling faint, sweaty, or blacking out  Passing bright red blood from your rectum  Blood Clot Symptoms   Tender, swollen or reddened areas in your calf muscle or thighs.  Numbness or tingling in your lower leg or calf, or at the top of your leg or groin  Skin on your leg looks pale or blue or feels cold to touch  Chest pain or have trouble breathing, lightheadedness, fast heart rate  Sudden Onset of Symptoms    Call 911 if you suddenly have:  Leg weakness and spasm  Loss of bladder or bowel function  Seizure  Confusion, severe headache, dizziness or feeling unsteady, problems talking, difficulty swallowing, and/or  numbness or muscle weakness as these could be signs of a stroke.  Follow up Appointment Your follow up appointment should be scheduled 2-3 weeks after your surgery date.  If you have not previously scheduled for a follow-up visit you can be scheduled by contacting [phone].   No Tylenol  before 3:15pm today.   Post Anesthesia Home Care Instructions  Activity: Get plenty of rest for the remainder of the day. A responsible individual must stay with you for 24 hours following the procedure.  For the next 24 hours, DO NOT: -Drive a car -Advertising copywriter -Drink alcoholic beverages -Take any medication unless instructed by your physician -Make any legal decisions or sign important papers.  Meals: Start with liquid foods such as gelatin or soup. Progress to regular foods as tolerated. Avoid greasy, spicy, heavy foods. If nausea and/or vomiting occur, drink only clear liquids until the nausea and/or  vomiting subsides. Call your physician if vomiting continues.  Special Instructions/Symptoms: Your throat may feel dry or sore from the anesthesia or the breathing tube placed in your throat during surgery. If this causes discomfort, gargle with warm salt water. The discomfort should disappear within 24 hours.  If you had a scopolamine patch placed behind your ear for the management of post- operative nausea and/or vomiting:  1. The medication in the patch is effective for 72 hours, after which it should be removed.  Wrap patch in a tissue and discard in the trash. Wash hands thoroughly with soap and water. 2. You may remove the patch earlier than 72 hours if you experience unpleasant side effects which may include dry mouth, dizziness or visual disturbances. 3. Avoid touching the patch. Wash your hands with soap and water after contact with the patch.

## 2024-02-14 NOTE — Op Note (Addendum)
 02/14/2024  11:44 AM  PATIENT:  Darren Little  43 y.o. male  Patient Care Team: Berneta Elsie Sayre, MD as PCP - General (Family Medicine) Deanna Channing LABOR, [facility] as Pharmacist (Pharmacist)  PRE-OPERATIVE DIAGNOSIS:  Scalp cyst  POST-OPERATIVE DIAGNOSIS:  Infected scalp cyst  PROCEDURE:  Excision of posterior scalp cyst (2cm x 2cm 2 cm)  SURGEON:  Cordella RONAL Idler, MD  ASSISTANT: None  ANESTHESIA:   general  COUNTS:  Sponge, needle and instrument counts were reported correct x2 at the conclusion of the operation.  EBL: Minimal  DRAINS: None  SPECIMEN: Posterior scalp cyst  COMPLICATIONS: None  FINDINGS: Infected cyst with purulent drainage  DISPOSITION: PACU in satisfactory condition  INDICATION: 43 y/o M with a recurrent cyst of the posterior scalp. He desired excision and I offered to take him to the operating room. All of his questions were addressed and written consent was obtained.  DESCRIPTION: The patient was identified in preop holding and taken to the OR where he was placed on the operating room table. SCDs were placed. General endotracheal anesthesia was induced without difficulty. He was then placed prone and his posterior scalp was then prepped and draped in the usual sterile fashion. A surgical timeout was performed indicating the correct patient, procedure, positioning and need for preoperative antibiotics.  The previously marked area near the posterior scalp was identified. A incision was made overlying the mass using a #15 blade and carried down through the subcutaneous tissue using electrocautery. The mass was encountered and appeared to be an infected cyst. The wall of the cyst was ruptured and there was purulent drainage. I was able to remove the wall of the cyst in piecemeal fashion. The cyst was approximately 2cm in size. The wound was irrigated with sterile saline and inspected for hemostasis. I fulgurated the wound base to account for any residual  cyst that might have been left behind. The wound was closed with interrupted 3-0 vicryl and covered with dermabond. A field block was performed using 0.25% marcaine with epinephrine .

## 2024-02-14 NOTE — Anesthesia Preprocedure Evaluation (Addendum)
 Anesthesia Evaluation  Patient identified by MRN, date of birth, ID band Patient awake    Reviewed: Allergy & Precautions, NPO status , Patient's Chart, lab work & pertinent test results  History of Anesthesia Complications Negative for: history of anesthetic complications  Airway Mallampati: III  TM Distance: >3 FB Neck ROM: Full    Dental  (+) Dental Advisory Given,    Pulmonary neg shortness of breath, sleep apnea (patient had sleep study but reports never being told the results; he says he does not snore) , neg COPD, neg recent URI   Pulmonary exam normal breath sounds clear to auscultation       Cardiovascular hypertension (amlodipine , olmesartan , triamterene -HCTZ), Pt. on medications (-) angina (-) Past MI, (-) Cardiac Stents and (-) CABG (-) dysrhythmias  Rhythm:Regular Rate:Normal  HLD  TTE 12/12/2023: IMPRESSIONS    1. Left ventricular ejection fraction, by estimation, is 60 to 65%. Left  ventricular ejection fraction by 3D volume is 57 %. The left ventricle has  normal function. The left ventricle has no regional wall motion  abnormalities. There is severe left  ventricular hypertrophy. Left ventricular diastolic parameters are  consistent with Grade II diastolic dysfunction (pseudonormalization). The  average left ventricular global longitudinal strain is -10.2 %. The global  longitudinal strain is abnormal.   2. Right ventricular systolic function is normal. The right ventricular  size is normal.   3. Left atrial size was mild to moderately dilated.   4. The mitral valve is normal in structure. No evidence of mitral valve  regurgitation. No evidence of mitral stenosis.   5. The aortic valve is normal in structure. Aortic valve regurgitation is  not visualized. No aortic stenosis is present.   6. The inferior vena cava is normal in size with greater than 50%  respiratory variability, suggesting right atrial  pressure of 3 mmHg.     Neuro/Psych negative neurological ROS     GI/Hepatic negative GI ROS, Neg liver ROS,,,  Endo/Other  diabetes, Type 2, Oral Hypoglycemic Agents    Renal/GU negative Renal ROS     Musculoskeletal   Abdominal  (+) + obese  Peds  Hematology  (+) Blood dyscrasia (homozygous alpha thalassemia)   Anesthesia Other Findings Last Mounjaro: 02/05/2024  Reproductive/Obstetrics                              Anesthesia Physical Anesthesia Plan  ASA: 3  Anesthesia Plan: General   Post-op Pain Management: Tylenol  PO (pre-op)*   Induction: Intravenous  PONV Risk Score and Plan: 2 and Ondansetron , Dexamethasone , Midazolam and Treatment may vary due to age or medical condition  Airway Management Planned: Oral ETT  Additional Equipment:   Intra-op Plan:   Post-operative Plan: Extubation in OR  Informed Consent: I have reviewed the patients History and Physical, chart, labs and discussed the procedure including the risks, benefits and alternatives for the proposed anesthesia with the patient or authorized representative who has indicated his/her understanding and acceptance.     Dental advisory given  Plan Discussed with: CRNA and Anesthesiologist  Anesthesia Plan Comments: (Risks of general anesthesia discussed including, but not limited to, sore throat, hoarse voice, chipped/damaged teeth, injury to vocal cords, nausea and vomiting, allergic reactions, lung infection, heart attack, stroke, and death. All questions answered. )         Anesthesia Quick Evaluation

## 2024-02-14 NOTE — H&P (Signed)
 Darren Little 08/28/1980  [id number].    HPI:  43 y/o M with a symptomatic right posterior scalp lesion who presents for elective repair. He reports that he is in his usual state of health and denies any recent changes in medication.   ROS: Review of Systems  Constitutional: Negative.   HENT: Negative.    Eyes: Negative.   Respiratory: Negative.    Cardiovascular: Negative.   Gastrointestinal: Negative.   Genitourinary: Negative.   Musculoskeletal: Negative.   Skin:        Posterior scalp lesion  Neurological: Negative.   Endo/Heme/Allergies: Negative.   Psychiatric/Behavioral: Negative.      Family History  Problem Relation Age of Onset   Heart disease Mother    Hypertension Mother    Hypertension Maternal Grandfather    Heart attack Neg Hx     Past Medical History:  Diagnosis Date   Abnormal CT of the chest 09/27/2012   Abnormal platelets (HCC) 09/26/2012   Large platelets   Abnormal RBC morphology 09/27/2012   Acute frontal sinusitis 06/11/2022   Anemia 01/25/2020   Chest pain 04/27/2022   Class 2 severe obesity due to excess calories with serious comorbidity and body mass index (BMI) of 37.0 to 37.9 in adult 09/27/2012   Ganglion cyst of dorsum of left wrist 07/17/2021   Homozygous alpha thalassemia 05/09/2021   Hx of cardiovascular stress test    ETT-Myoview 7/14:  Normal, EF 54%, no ischemia   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   Hyperlipidemia associated with type 2 diabetes mellitus (HCC) 12/31/2018   Hypertension    Hypertensive emergency 09/27/2012   Iron  deficiency 01/09/2021   Left shoulder pain 01/09/2021   Lipoma of right upper extremity 07/17/2021   LVH (left ventricular hypertrophy)    Echo 12/12/23 EF 60-65%   Microcytic anemia 09/27/2012   Microcytosis    MCV 65   OSA (obstructive sleep apnea) 06/27/2021   f/u screening negative   Pain of left calf 09/27/2012   Patellofemoral pain syndrome of both knees 01/25/2020   Trigger ring  finger of right hand 01/25/2020   Type 2 diabetes mellitus without complication, without long-term current use of insulin (HCC) 01/25/2020    Past Surgical History:  Procedure Laterality Date   HAND SURGERY Right 2000   Right hand, 5years ago    Social History:  reports that he has never smoked. He has never used smokeless tobacco. He reports that he does not drink alcohol and does not use drugs.  Allergies:  Allergies  Allergen Reactions   Atorvastatin  Hives   Bee Venom Swelling   Other Other (See Comments)    Squash- hives    Medications Prior to Admission  Medication Sig Dispense Refill   amLODipine  (NORVASC ) 10 MG tablet TAKE 1 TABLET BY MOUTH DAILY 90 tablet 3   lovastatin  (MEVACOR ) 40 MG tablet Take 1 tablet (40 mg total) by mouth at bedtime. 90 tablet 3   metFORMIN  (GLUCOPHAGE -XR) 500 MG 24 hr tablet Take 2 tablets (1,000 mg total) by mouth 2 (two) times daily with a meal. 360 tablet 0   olmesartan  (BENICAR ) 40 MG tablet TAKE 1 TABLET BY MOUTH DAILY 90 tablet 1   triamterene -hydrochlorothiazide  (MAXZIDE-25) 37.5-25 MG tablet TAKE 1 TABLET BY MOUTH DAILY 90 tablet 3   Accu-Chek Softclix Lancets lancets Use to monitor blood glucose first thing in the morning before eating and again 1-2 hours after meals 100 each 12   blood glucose meter kit and supplies  KIT Use to check FSBS BID. (Dx: E11.29, E11.65, R80.9). 1 each 0   diclofenac  Sodium (VOLTAREN ) 1 % GEL Apply a small grape sized dollop on sore joint up to 4 times a day as needed. 150 g 2   EPINEPHrine  0.3 mg/0.3 mL IJ SOAJ injection Inject 0.3 mg into the muscle as needed for anaphylaxis. 2 each 2   glucose blood (ACCU-CHEK GUIDE TEST) test strip Use to monitor blood glucose first thing in the morning before eating and again 1-2 hours after meals 100 each 12   tirzepatide (MOUNJARO) 2.5 MG/0.5ML Pen Inject 2.5 mg into the skin once a week. 2 mL 0    Physical Exam: Height 6' 1 (1.854 m), weight 127 kg. Gen: male,  NAD Posterior scalp. Right sided lesion marked  Results for orders placed or performed during the hospital encounter of 02/14/24 (from the past 48 hours)  Basic metabolic panel per protocol     Status: Abnormal   Collection Time: 02/13/24  2:30 PM  Result Value Ref Range   Sodium 136 135 - 145 mmol/L   Potassium 4.2 3.5 - 5.1 mmol/L   Chloride 101 98 - 111 mmol/L   CO2 23 22 - 32 mmol/L   Glucose, Bld 287 (H) 70 - 99 mg/dL    Comment: Glucose reference range applies only to samples taken after fasting for at least 8 hours.   BUN 10 6 - 20 mg/dL   Creatinine, Ser 8.70 (H) 0.61 - 1.24 mg/dL   Calcium  8.8 (L) 8.9 - 10.3 mg/dL   GFR, Estimated >39 >39 mL/min    Comment: (NOTE) Calculated using the CKD-EPI Creatinine Equation (2021)    Anion gap 12 5 - 15    Comment: Performed at [facility], [address] [place], [place] [address]   No results found.  Assessment/Plan 43 y/o M with a posterior right scalp lesion  - Will proceed to the OR. We discussed the alternatives and potential risks of surgery, including but not limited to: bleeding, infection, damage to surrounding structures, recurrence, and need for additional procedures. All questions were addressed and consent was obtained.    Cordella DELENA Polly Marlis Cheron Surgery 02/14/2024, 9:00 AM Please see Amion for pager number during day hours 7:00am-4:30pm or 7:00am -11:30am on weekends

## 2024-02-15 ENCOUNTER — Encounter (HOSPITAL_BASED_OUTPATIENT_CLINIC_OR_DEPARTMENT_OTHER): Payer: Self-pay | Admitting: General Surgery

## 2024-02-18 LAB — SURGICAL PATHOLOGY

## 2024-03-30 ENCOUNTER — Ambulatory Visit: Admitting: Family Medicine

## 2024-04-03 ENCOUNTER — Encounter: Payer: Self-pay | Admitting: Family Medicine
